# Patient Record
Sex: Female | Born: 1960
Health system: Southern US, Community
[De-identification: ages and names within clinical notes are randomized; demographics above are authoritative.]

## PROBLEM LIST (undated history)

## (undated) DIAGNOSIS — Z923 Personal history of irradiation: Secondary | ICD-10-CM

## (undated) DIAGNOSIS — M109 Gout, unspecified: Secondary | ICD-10-CM

## (undated) DIAGNOSIS — M7989 Other specified soft tissue disorders: Secondary | ICD-10-CM

## (undated) DIAGNOSIS — Z780 Asymptomatic menopausal state: Secondary | ICD-10-CM

## (undated) DIAGNOSIS — Z9221 Personal history of antineoplastic chemotherapy: Secondary | ICD-10-CM

## (undated) DIAGNOSIS — I10 Essential (primary) hypertension: Secondary | ICD-10-CM

## (undated) DIAGNOSIS — A159 Respiratory tuberculosis unspecified: Secondary | ICD-10-CM

## (undated) DIAGNOSIS — K635 Polyp of colon: Secondary | ICD-10-CM

## (undated) DIAGNOSIS — E119 Type 2 diabetes mellitus without complications: Secondary | ICD-10-CM

## (undated) DIAGNOSIS — C50919 Malignant neoplasm of unspecified site of unspecified female breast: Secondary | ICD-10-CM

## (undated) DIAGNOSIS — R011 Cardiac murmur, unspecified: Secondary | ICD-10-CM

## (undated) DIAGNOSIS — E785 Hyperlipidemia, unspecified: Secondary | ICD-10-CM

## (undated) DIAGNOSIS — G4730 Sleep apnea, unspecified: Secondary | ICD-10-CM

## (undated) HISTORY — DX: Polyp of colon: K63.5

## (undated) HISTORY — DX: Type 2 diabetes mellitus without complications: E11.9

## (undated) HISTORY — DX: Hyperlipidemia, unspecified: E78.5

## (undated) HISTORY — PX: POLYPECTOMY: SHX149

## (undated) HISTORY — DX: Other specified soft tissue disorders: M79.89

## (undated) HISTORY — DX: Respiratory tuberculosis unspecified: A15.9

## (undated) HISTORY — DX: Cardiac murmur, unspecified: R01.1

## (undated) HISTORY — DX: Asymptomatic menopausal state: Z78.0

## (undated) HISTORY — DX: Gout, unspecified: M10.9

## (undated) HISTORY — PX: COLONOSCOPY: SHX174

## (undated) HISTORY — DX: Malignant neoplasm of unspecified site of unspecified female breast: C50.919

## (undated) HISTORY — PX: BREAST BIOPSY: SHX20

## (undated) HISTORY — DX: Essential (primary) hypertension: I10

---

## 2005-05-27 ENCOUNTER — Emergency Department: Payer: Self-pay | Admitting: Emergency Medicine

## 2005-10-18 ENCOUNTER — Ambulatory Visit: Payer: Self-pay | Admitting: Gastroenterology

## 2010-05-29 ENCOUNTER — Ambulatory Visit: Payer: Self-pay

## 2011-01-10 ENCOUNTER — Encounter: Payer: Self-pay | Admitting: Obstetrics and Gynecology

## 2011-01-10 ENCOUNTER — Ambulatory Visit (INDEPENDENT_AMBULATORY_CARE_PROVIDER_SITE_OTHER): Payer: PRIVATE HEALTH INSURANCE | Admitting: Obstetrics and Gynecology

## 2011-01-10 VITALS — BP 118/92 | HR 90 | Ht 64.0 in | Wt 242.0 lb

## 2011-01-10 DIAGNOSIS — Z01419 Encounter for gynecological examination (general) (routine) without abnormal findings: Secondary | ICD-10-CM

## 2011-01-10 DIAGNOSIS — Z1272 Encounter for screening for malignant neoplasm of vagina: Secondary | ICD-10-CM

## 2011-01-10 DIAGNOSIS — Z113 Encounter for screening for infections with a predominantly sexual mode of transmission: Secondary | ICD-10-CM

## 2011-01-10 LAB — RPR

## 2011-01-10 NOTE — Progress Notes (Signed)
  Subjective:    Patient ID: Kim Roberts, female    DOB: 08-Dec-1960, 50 y.o.   MRN: [id number]  HPI 50yo Go presenting today for annual exam. Patient has been postmenopausal for the past 3 years. Patient is without any complaints and denies abnormal bleeding, discharge or pelvic pain. Patient is sexually active and in a monogamous relationship. Patient denies any symptoms of dyspareunia.  PMH: Hypertension  PSH: Denies  PGynH: H/o abnormal pap smears years ago followed by repeat pap smear. Denies fibroid or ovarian cyst. Patient had colonoscopy in 2005 and a mammogram in December 2011.  Family Hx: mother diagnosed with colon cancer in her 92's, brother with colon cancer in his 30's and father with lung cancer diagnosed in his 35's; no other significant health problems in the family  Social Hx: Denies alcohol abuse, smoking or the use of illicit drugs   Review of Systems Negative    Objective:   Physical Exam  GENERAL: Well-developed, well-nourished female in no acute distress.  HEENT: Normocephalic, atraumatic. Sclerae anicteric.  NECK: Supple. Normal thyroid.  LUNGS: Clear to auscultation bilaterally.  HEART: Regular rate and rhythm. BREASTS: Symmetric in size. No palpable masses or lymphadenopathy, skin changes, or nipple drainage. ABDOMEN: Soft, nontender, nondistended. No organomegaly. PELVIC: Normal external female genitalia. Vagina is pink and rugated.  Normal discharge. Normal appearing cervix. Uterus is normal in size.  No adnexal mass or tenderness. EXTREMITIES: No cyanosis, clubbing, or edema, 2+ distal pulses.      Assessment & Plan:  50yo G0 here for annual exam - pap smear was collected - all STD testing performed at patient's request - Patient to be seen by PCP tomorrow and will have colonoscopy scheduled - patient will be contacted with any abnormal results. - Patient is interested in Lynch syndrome testing and will return for that or have the test performed  by PCP - Patient to return in a year or prn.

## 2011-01-11 ENCOUNTER — Ambulatory Visit (INDEPENDENT_AMBULATORY_CARE_PROVIDER_SITE_OTHER): Payer: PRIVATE HEALTH INSURANCE | Admitting: Family Medicine

## 2011-01-11 ENCOUNTER — Encounter: Payer: Self-pay | Admitting: Family Medicine

## 2011-01-11 ENCOUNTER — Ambulatory Visit (AMBULATORY_SURGERY_CENTER): Payer: PRIVATE HEALTH INSURANCE | Admitting: *Deleted

## 2011-01-11 VITALS — BP 120/80 | HR 86 | Temp 98.5°F | Ht 65.0 in | Wt 242.2 lb

## 2011-01-11 DIAGNOSIS — R229 Localized swelling, mass and lump, unspecified: Secondary | ICD-10-CM

## 2011-01-11 DIAGNOSIS — Z23 Encounter for immunization: Secondary | ICD-10-CM

## 2011-01-11 DIAGNOSIS — Z1211 Encounter for screening for malignant neoplasm of colon: Secondary | ICD-10-CM

## 2011-01-11 DIAGNOSIS — R2230 Localized swelling, mass and lump, unspecified upper limb: Secondary | ICD-10-CM

## 2011-01-11 DIAGNOSIS — Z86010 Personal history of colonic polyps: Secondary | ICD-10-CM

## 2011-01-11 DIAGNOSIS — Z800 Family history of malignant neoplasm of digestive organs: Secondary | ICD-10-CM

## 2011-01-11 DIAGNOSIS — L83 Acanthosis nigricans: Secondary | ICD-10-CM

## 2011-01-11 LAB — HEPATITIS C ANTIBODY: HCV Ab: NEGATIVE

## 2011-01-11 MED ORDER — PEG-KCL-NACL-NASULF-NA ASC-C 100 G PO SOLR: 1.0000 | Freq: Once | ORAL | Status: DC

## 2011-01-11 NOTE — Progress Notes (Signed)
Pt states she will bring in a copy of her last colonoscopy when she comes in for her procedure

## 2011-01-11 NOTE — Assessment & Plan Note (Addendum)
R>L, h/o boils. Refer for consideration of excision.

## 2011-01-11 NOTE — Patient Instructions (Addendum)
See Shirlee Limerick about your referral before your leave today. I would get a flu shot each fall.   Please send your labs from work to me in October.  Take care.

## 2011-01-12 ENCOUNTER — Encounter: Payer: Self-pay | Admitting: Family Medicine

## 2011-01-12 ENCOUNTER — Encounter: Payer: Self-pay | Admitting: Gastroenterology

## 2011-01-12 ENCOUNTER — Telehealth: Payer: Self-pay | Admitting: Family Medicine

## 2011-01-12 DIAGNOSIS — L83 Acanthosis nigricans: Secondary | ICD-10-CM | POA: Insufficient documentation

## 2011-01-12 NOTE — Assessment & Plan Note (Signed)
Refer

## 2011-01-12 NOTE — Assessment & Plan Note (Signed)
D/w pt that this was a marker for insulin resistance.  Work on diet and weight.  She had labs done at work prev, will have repeat done at workplace screening in 10/12.  She'll get me a copy.

## 2011-01-12 NOTE — Telephone Encounter (Signed)
Pt had HTN and h/o TB listed in chart from the [facility] clinic.  Please call pt.  No mention of this was made at the OV with me. Please clarify the following: -if/when she had dx of HTN -if she is/was on BP meds -if/when she had dx of TB or positive PPD -if she is/was on meds for TB/postive PPD.  Please ask if there are other health history items that weren't stated at the OV with me.

## 2011-01-12 NOTE — Progress Notes (Signed)
New pt to est care.   FH of colon CA.  Needs eval.  No blood in stool.    H/o axillary mass.  H/o boils in axilla.    Acanthosis nigricans.  See discussion below.  PMH and SH reviewed  ROS: See HPI, otherwise noncontributory.  Meds, vitals, and allergies reviewed.   GEN: nad, alert and oriented, overweight HEENT: mucous membranes moist NECK: supple w/o LA CV: rrr. PULM: ctab, no inc wob ABD: soft, +bs EXT: no edema SKIN: no acute rash but acanthotic changes noted on the neck.  Prominent but not ttp fat pad noted in R axilla.

## 2011-01-12 NOTE — Telephone Encounter (Signed)
Left message on cell phone voicemail for patient to return call.  Called her at work and she is off today.

## 2011-01-15 NOTE — Telephone Encounter (Signed)
Left message for patient to return my call.

## 2011-01-15 NOTE — Telephone Encounter (Signed)
Left message on cell phone voicemail for patient to return call.  Called work number and was advised that she only works Friday, Sat, and Sundays.

## 2011-01-22 ENCOUNTER — Ambulatory Visit (AMBULATORY_SURGERY_CENTER): Payer: PRIVATE HEALTH INSURANCE | Admitting: Gastroenterology

## 2011-01-22 ENCOUNTER — Encounter: Payer: Self-pay | Admitting: Gastroenterology

## 2011-01-22 DIAGNOSIS — Z800 Family history of malignant neoplasm of digestive organs: Secondary | ICD-10-CM

## 2011-01-22 DIAGNOSIS — D126 Benign neoplasm of colon, unspecified: Secondary | ICD-10-CM

## 2011-01-22 DIAGNOSIS — Z8371 Family history of colonic polyps: Secondary | ICD-10-CM

## 2011-01-22 DIAGNOSIS — Z86010 Personal history of colonic polyps: Secondary | ICD-10-CM

## 2011-01-22 DIAGNOSIS — Z1211 Encounter for screening for malignant neoplasm of colon: Secondary | ICD-10-CM

## 2011-01-22 MED ORDER — SODIUM CHLORIDE 0.9 % IV SOLN: 500.0000 mL | INTRAVENOUS | Status: DC

## 2011-01-22 NOTE — Patient Instructions (Signed)
FOLLOW DISCHARGE INSTRUCTIONS BLUE & GREEN SHEETS   INFORMATION ON POLYPS GIVEN TO PT.   ALL INFO REGARDING DIAGNOSIS IN SEALED ENVELOPE & SENT HOME WITH PT . SINCE PT . HIPPA

## 2011-01-23 ENCOUNTER — Telehealth: Payer: Self-pay

## 2011-01-23 NOTE — Telephone Encounter (Signed)
No answer

## 2011-01-24 ENCOUNTER — Ambulatory Visit (INDEPENDENT_AMBULATORY_CARE_PROVIDER_SITE_OTHER): Payer: Self-pay | Admitting: General Surgery

## 2011-01-30 NOTE — Telephone Encounter (Signed)
Please try to contact pt as below.

## 2011-01-31 ENCOUNTER — Encounter: Payer: Self-pay | Admitting: Family Medicine

## 2011-01-31 NOTE — Telephone Encounter (Signed)
Agreed, noted.  History updated.

## 2011-01-31 NOTE — Telephone Encounter (Signed)
Spoke with patient.   She has never really been diagnosed with HTN but she has had some high BP readings intermittently.  She has never been on hypertensive medications. 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 Patient says she does have some concerns about her BP.  I asked her to try to get some readings and keep a log of them to report to [place].  She was given our fax number or advised that she could call them in as well.  She agreed and says she can probably do that at her job but she only works Fridays, Sat., Sun.

## 2011-02-13 ENCOUNTER — Ambulatory Visit (INDEPENDENT_AMBULATORY_CARE_PROVIDER_SITE_OTHER): Payer: Self-pay | Admitting: General Surgery

## 2011-02-22 ENCOUNTER — Ambulatory Visit (INDEPENDENT_AMBULATORY_CARE_PROVIDER_SITE_OTHER): Payer: Self-pay | Admitting: General Surgery

## 2011-03-27 ENCOUNTER — Ambulatory Visit (INDEPENDENT_AMBULATORY_CARE_PROVIDER_SITE_OTHER): Payer: PRIVATE HEALTH INSURANCE | Admitting: General Surgery

## 2011-03-29 ENCOUNTER — Ambulatory Visit (INDEPENDENT_AMBULATORY_CARE_PROVIDER_SITE_OTHER): Payer: Self-pay | Admitting: General Surgery

## 2011-04-03 ENCOUNTER — Encounter (INDEPENDENT_AMBULATORY_CARE_PROVIDER_SITE_OTHER): Payer: Self-pay | Admitting: General Surgery

## 2011-04-03 ENCOUNTER — Ambulatory Visit (INDEPENDENT_AMBULATORY_CARE_PROVIDER_SITE_OTHER): Payer: PRIVATE HEALTH INSURANCE | Admitting: General Surgery

## 2011-04-03 VITALS — BP 124/88 | HR 60 | Temp 97.9°F | Resp 20 | Ht 65.0 in | Wt 236.4 lb

## 2011-04-03 DIAGNOSIS — D1720 Benign lipomatous neoplasm of skin and subcutaneous tissue of unspecified limb: Secondary | ICD-10-CM

## 2011-04-03 DIAGNOSIS — D1739 Benign lipomatous neoplasm of skin and subcutaneous tissue of other sites: Secondary | ICD-10-CM

## 2011-04-03 NOTE — Progress Notes (Signed)
Subjective:     Patient ID: Kim Roberts, female   DOB: Nov 04, 1960, 50 y.o.   MRN: [id number]  HPIPatient is a 50 year old female who is significantly overweight who has an increase in size lipoma in the right axilla a smaller fatty tissue in the left axilla who desires that it be removed she was referred by Dr. Dr. Chong Sicilian who referred her for colonoscopy by GI and to [place] for consideration of excision of this large axillary mass patient says this been gradually increasing in size for approximately 3 years she says she's not able to wear sleeveless wises because of visible and that she has had some problems with a low-grade infection the actual treated with antibiotics years ago and describes O. family members it sounds like hidradenitis. 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   Review of Systems Past Surgical History  Procedure Date  . Colonoscopy   . Polypectomy    No current outpatient prescriptions on file.   No Known Allergies On review of system the patient denies any problems with high blood pressure cardiac showed rather chronic problem    Objective:   Physical ExamBP 124/88  Pulse 60  Temp 97.9 F (36.6 C)  Resp 20  Ht 5\' 5"  (1.651 m)  Wt 236 lb 6 oz (107.219 kg)  BMI 39.33 kg/m2 Examination Limited predominantly to the breast axilla shows a fairly overweight black female in no acute distress there is a small axillary fat pad and the left axilla and a larger one in the right which she is concerned about on palpation of this area there is no evidence of any firm mass I appreciate no axillary lymphadenopathy. I did axillary exam here in the office and this appears to be just  excess fatty tissue nothing I can appreciate that looks like a breast to assure and she says it's been increasing in size for approximately 3 year. There is no active areas of hidradenitis and I can appreciate at this time in either the right or left axilla. Two things will need to be performed before considering surgery and picturesfor her insurance coverage were taken and I think we need to get approval by her insurance company for proceed in to schedule an. I also think she needs a recent mammogram that I do not think that they'll find any mammillary to share in this mass and I think this is just be excess fatty and not truly a localized lipoma the left axilla is also slightly larger than normal but much smaller than the right. He denies other lipomas and other areas of her body but she does weigh 236 parents and is only 5 feet 5     Assessment:    Excess fatty tissue in the right axilla that the patient desires to be excised. I discussed with her that this is a fairly significant operation and it needs to be done in the operating room will be a prominent 3-4 inch incision had minimal and that she would be out of work for probably about 2 weeks. I think it very unlikely to be any type of serious problem and some would consider this cosmetic  Plan:    See me in approximately 2 weeks and bring your mammograms from [facility] on her next appointment

## 2011-04-03 NOTE — Patient Instructions (Signed)
Patient will need a repeat mammogram with attention to both axilla and then we'll also need to work with her insurance company to see whether excision of a lipoma in the axilla would be covered

## 2011-04-04 ENCOUNTER — Other Ambulatory Visit (INDEPENDENT_AMBULATORY_CARE_PROVIDER_SITE_OTHER): Payer: Self-pay | Admitting: General Surgery

## 2011-04-05 ENCOUNTER — Telehealth (INDEPENDENT_AMBULATORY_CARE_PROVIDER_SITE_OTHER): Payer: Self-pay

## 2011-04-05 NOTE — Telephone Encounter (Signed)
Repeat mammogram scheduled for 05/31/2011 at [facility] was made on 04/04/2011.  Order was faxed. Message left on patients voicemail [phone] for return call about possible cost of axillary surgery on the same day. Message also left today 04/05/2011. RMP

## 2011-05-31 ENCOUNTER — Ambulatory Visit: Payer: Self-pay

## 2011-06-26 ENCOUNTER — Ambulatory Visit (INDEPENDENT_AMBULATORY_CARE_PROVIDER_SITE_OTHER): Payer: PRIVATE HEALTH INSURANCE | Admitting: Obstetrics and Gynecology

## 2011-06-26 ENCOUNTER — Encounter: Payer: Self-pay | Admitting: Obstetrics and Gynecology

## 2011-06-26 VITALS — BP 135/84 | HR 87 | Ht 66.0 in | Wt 242.0 lb

## 2011-06-26 DIAGNOSIS — Z780 Asymptomatic menopausal state: Secondary | ICD-10-CM

## 2011-06-26 NOTE — Patient Instructions (Signed)
Contraceptive Devices (IUD) IUD stands for intrauterine device. Intrauterine means inside the womb (uterus). The purpose of the IUD is to prevent pregnancy. IUDs make it more difficult for your partner's sperm to get into your womb and into your fallopian tubes, where the eggs are fertilized. IUDs also alter the secretions of your cervix, which make it a stronger sperm barrier. They also affect the lining of the womb, so it is harder for an egg to implant. The IUD does not cause an abortion. There are 2 types of IUDs available:  Copper IUD gives off a small amount of copper inside the uterus. This prevents the sperm from going through the uterus, up into the fallopian tube, where the egg is fertilized. The copper IUD can also damage or prevent the fertilized egg from attaching on the inside lining of the uterus. It can stay in place for 10 years. The copper IUD can be used as an emergency contraceptive, if inserted within 5 days after having unprotected sexual intercourse.   Hormone IUD contains progestin (synthetic progesterone), and it releases this hormone into the uterus. The hormone thickens the mucus on the cervix and prevents sperm from entering the uterus. It also weakens the sperm that get into the uterus, so that the sperm and egg cannot live in the fallopian tube. It also makes the inside lining of the uterus thinner, which makes it difficult for a fertilized egg to attach to the uterus. The hormone progestin in the IUD decreases the amount of bleeding during a menstrual period and can be helpful in women who have heavy menstrual periods. The hormone IUD can stay in place for 5 years.  SIDE EFFECTS OF THE IUD:  There may be more cramping or pain with periods.   It may cause heavier, longer periods, which can cause lack of red blood cells (anemia) and can interfere with your daily and sexual activities.  This method of birth control is not usually the best choice for a woman with heavy or  prolonged periods. The birth control pill may be a better choice. IUDs work best for women who have already had a pregnancy, because the cervix is more open, making the insertion of the device easier and less painful. However, many women without children use the IUD. One of the main goals of patient selection is to prevent unintentionally inserting an IUD into a patient who has an STD (sexually transmitted disease), who is at high risk of exposure to an STD, or who is already pregnant. That is why the IUD is inserted during, or right after, a menstrual period. REASONS NOT TO USE AN IUD:  The womb or cervix is not shaped normally.   You have or have had a pelvic infection, such as an STD, in the past 3 months.   You have or suspect cancer in the female organs.   You have an abnormal Pap smear.   You have certain liver diseases.   There is severe infection or inflammation of the cervix (cervicitis).   You have unexplained vaginal bleeding.   You have heart valve problems (unless a heart specialist advises otherwise).   You are allergic to copper (rare).   You previously had a pregnancy outside the uterus (ectopic).   You are pregnant or suspect you are pregnant.   You have prolonged or heavy periods, or heavy pain or cramping with periods (except for the hormone IUD).   You have or suspect pelvic cancer.   You have an   STD.   The cervix or uterus has problems (cervical stenosis, fibroids in the uterus) making it difficult to insert the IUD.  IUDs should be removed when a woman becomes menopausal or pregnant. BENEFITS OF THE IUD:  You are always ready to have sexual intercourse.   The copper IUD does not interfere with your female hormones.   The copper IUD can be used as emergency contraception.   An IUD can be used while nursing.   It works immediately after insertion, and there is no problem getting pregnant when it is removed.   It does not interfere with foreplay.    The progesterone IUD can make heavy menstrual periods lighter.   The progesterone IUD can be used for 5 years.   The copper IUD can be used for 10 years.  RISKS AND COMPLICATIONS  Putting the IUD through the uterus, into the pelvis or abdomen (perforation of the uterus).   Losing the IUD (expulsion). This is more common in women who never had children.   When pregnant with an IUD, there is an increased chance of an infection and loss of the pregnancy.   Pregnancy in the fallopian tube (ectopic).   STD, in women who have more than 1 sex partner. The IUD does not protect against STDs.   Other minor side effects may include:   Headaches.   Feeling sick to your stomach (nausea).   Breast tenderness.   Depression.  PROCEDURE   The IUD is inserted during or right after a menstrual period, to make sure you are not pregnant.   You will lie on an exam table, naked from the waist down.   Your caregiver will do an exam to determine the size and position of your uterus.   Usually, an anesthetic is not needed.   Your caregiver may give you a pain pill to take, 1 or 2 hours before the procedure.   Sometimes, a paracervical block may be used to block and control any discomfort with insertion.   A tool (speculum) is then placed in your vagina (birth canal) so your caregiver can see the cervix.   A sound is sent into the uterus to check the depth of the uterus.   A slim instrument (IUD inserter), which is shaped like a drinking straw, is inserted through the small opening in your cervix and into your uterus.   Then, the IUD is pushed in with a plunger, much like a syringe, and the inserter is removed. There may be some cramping and pain during the insertion.   Relaxing helps to lessen the discomfort.   Following the procedure, you will usually spot blood. Have some pads with you. Avoid using tampons for 2 weeks. Bleeding after the procedure is normal. It varies from light  spotting for a few days to menstrual-like bleeding for up to 3 weeks. It may be like a period if it is near the time for your normal period.   You may also have mild cramping. Only take over-the-counter or prescription medicines for pain, discomfort, or fever as directed by your caregiver. Do not use aspirin, as this may increase bleeding.   Practice checking the string coming out of the cervix, to make sure the IUD is always in the uterus.  HOME CARE INSTRUCTIONS   Do not drive for 24 hours.   Only take over-the-counter or prescription medicines for pain, discomfort, or fever as directed by your caregiver.   No tampons, douching, or sexual intercourse for   2 weeks, or until your caregiver approves.   Rest at home for 24 hours. You may then resume normal activities, unless told otherwise by your caregiver.   Check your IUD prior to resuming sexual activity, to make sure it is in place. Make sure that you can feel the strings. An IUD can be pushed out and lost without the user even knowing it is gone. Also, if the strings are getting longer, it may mean that the IUD is being forced out of the uterus. This means it is no longer offering full protection from pregnancy.   Take any medications your caregiver has ordered, as directed.   Make sure to keep your recheck appointment, so your caregiver can make sure your IUD has remained in place. After that exam, yearly exams are advised, unless you cannot feel the strings of your IUD.   Check that the IUD is still in place by feeling for the strings after every menstrual period.  SEEK MEDICAL CARE IF:   Bleeding is heavier than a normal menstrual cycle.   You have an oral temperature above 102 F (38.9 C).   You have increasing cramps or pains, not relieved with medicine. Or you develop belly (abdominal) pain that does not seem to be related to the same area of earlier cramping and pain.   You are lightheaded, unusually weak, or have fainting  episodes.   You develop pain in the tops of your shoulders (shoulder strap areas).   You are having problems or questions, which have not been answered well enough by your caregiver.   You develop abdominal pain.   You have pain during sexual intercourse.   You cannot feel the IUD strings.   You have abnormal vaginal discharge.   You feel the IUD at the opening of the cervix in the vagina.   You think you are pregnant.   You miss your menstrual period.   The IUD string is hurting your sex partner.   The IUD string has gotten longer.  Document Released: 05/01/2004 Document Revised: 09/12/2010 Document Reviewed: 06/13/2009 [facility] Patient Information 2012 [place], [place].  Sterilization, Women Sterilization is a surgical procedure. This surgery permanently prevents pregnancy in women. This can be done by tying (with or without cutting) the fallopian tubes or burning the tubes closed (tubal ligation). Tubal ligation blocks the tubes and prevents the egg from being fertilized by the sperm. Sterilization can be done by removing the ovaries that produce the egg (castration) as well. Sterilization is considered safe with very rare complications. It does not affect menstrual periods, sexual desire, or performance.  Since sterilization is considered permanent, you should not do it until you are sure you do not want to have more children. You and your partner should fully agree to have the procedure. Your decision to have the procedure should not be made when you are in a stressful situation. This can include a loss of a pregnancy, illness or death of a spouse, or divorce. There are other means of preventing unwanted pregnancies that can be used until you are completely sure you want to be sterilized. Sterilization does not protect against sexually transmitted disease. Women who had a sterilization procedure and want it reversed must know that it requires an expensive and major operation. The  reversal may not be successful and has a high rate of tubal (ectopic) pregnancy that can be dangerous and require surgery. There are several ways to perform a tubal sterlization:  Laparoscopy. The abdomen is  filled with a gas to see the pelvic organs. Then, a tube with a light attached is inserted into the abdomen through 2 small incisions. The fallopian tubes are blocked with a ring, clip or electrocautery to burn closed the tubes. Then, the gas is released and the small incisions are closed.   Hysteroscopy. A tube with a light is inserted in the vagina, through the cervix and then into the uterus. A spring-like instrument is inserted into the opening of the fallopian tubes. The spring causes scaring and blocks the tubes. Other forms of contraception should be used for three months at which time an X-ray is done to be sure the tubes are blocked.   Minilaparotomy. This is done right after giving birth. A small incision is made under the belly button and the tubes are exposed. The tubes can then be burned, tied and/or cut.   Tubal ligation can be done during a Cesarean section.   Castration is a surgical procedure that removes both ovaries.  Tubal sterilization should be discussed with your caregiver to answer any concerns you or your partner might have. This meeting will help to decide for sure if the operation is safe for you and which procedure is the best one for you. You can change your mind and cancel the surgery at any time. HOME CARE INSTRUCTIONS   Follow your caregivers instructions regarding diet, rest, work, social and sexual activities and follow up appointments.   Shoulder pain is common following a laparoscopy. The pain may be relieved by lying down flat.   Only take over-the-counter or prescription medicines for pain, discomfort or fever as directed by your caregiver.   You may use lozenges for throat discomfort.   Keep the incisions covered to prevent infection.  SEEK IMMEDIATE  MEDICAL CARE IF:   You develop a temperature of 102 F (38.9 C), or as your caregiver suggests.   You become dizzy or faint.   You start to feel sick to your stomach (nausea) or throw up (vomit).   You develop abdominal pain not relieved with over-the-counter medications.   You have redness and puffiness (swelling) of the cut (incision).   You see pus draining from the incision.   You miss a menstrual period.  Document Released: 11/14/2007 Document Revised: 02/07/2011 Document Reviewed: 11/14/2007 [facility] Patient Information 2012 [place], [place].

## 2011-06-26 NOTE — Progress Notes (Signed)
51 yo AA with BMI 39.2 presenting today requesting information on birth control options. At her annual visit in August, patient reported being amenorrheic for the past 2-3 years. Her sexual partner is concern of the possibility of pregnancy and desires the patient to be initiated on OCP.  It was explained to the patient that due to her prolonged amenorrhea and her age, she is more than likely menopausal. An FSH and LH level can will be ordered to confirm that. Patient was still provided with information on Mirena IUD and BTL at her request. Patient will be contacted with any abnormal results and will return to discuss birth control if indicated.

## 2011-07-03 ENCOUNTER — Encounter (INDEPENDENT_AMBULATORY_CARE_PROVIDER_SITE_OTHER): Payer: Self-pay | Admitting: General Surgery

## 2011-07-03 ENCOUNTER — Ambulatory Visit (INDEPENDENT_AMBULATORY_CARE_PROVIDER_SITE_OTHER): Payer: PRIVATE HEALTH INSURANCE | Admitting: General Surgery

## 2011-07-03 VITALS — BP 152/100 | HR 96 | Temp 97.8°F | Resp 20 | Ht 66.0 in | Wt 240.6 lb

## 2011-07-03 DIAGNOSIS — R229 Localized swelling, mass and lump, unspecified: Secondary | ICD-10-CM

## 2011-07-03 DIAGNOSIS — R2230 Localized swelling, mass and lump, unspecified upper limb: Secondary | ICD-10-CM

## 2011-07-03 NOTE — Assessment & Plan Note (Signed)
Plan excision of R axillary mass.   Feels like lipoma 3x4x4 cm Discussed incision, recovery time, risk of infection, risk of wound breakdown, risk of bleeding and swelling. She understands and wishes to proceed.

## 2011-07-03 NOTE — Progress Notes (Signed)
Chief Complaint   Patient presents with   .  Other     est pt- pt of dr ww, eval bil. axillary mass    HISTORY:  Pt presents with many year history of R axillary mass. This has been painful and catches in her clothes. She also has issues wearing sleeveless shirts due to the size of the mass. She has had prior infection in the skin overlying the mass. Her mammograms have been negative. She denies fever/chills. She has not had changes with her menstrual cycle.   Past Medical History   Diagnosis  Date   .  Heart murmur      as a child   .  Menopause    .  Hypertension    .  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   .  Night sweats    .  Hyperlipidemia    .  Colon polyp     Past Surgical History   Procedure  Date   .  Colonoscopy    .  Polypectomy     No current outpatient prescriptions on file.   No Known Allergies  Family History   Problem  Relation  Age of Onset   .  Cancer  Mother  62      colon cancer    .  Colon cancer  Mother    .  Cancer  Father  63      lung cancer    .  Seizures  Brother    .  Colon cancer  Brother    .  Diabetes  Paternal Uncle    .  Asthma  Other    .  Esophageal cancer  Neg Hx    .  Stomach cancer  Neg Hx     History    Social History   .  Marital Status:  Single     Spouse Name:  N/A     Number of Children:  N/A   .  Years of Education:  N/A    Social History Main Topics   .  Smoking status:  Never Smoker   .  Smokeless tobacco:  Never Used   .  Alcohol Use:  No      occassionally   .  Drug Use:  No   .  Sexually Active:  Yes     Birth Control/ Protection:  Post-menopausal    Other Topics  Concern   .  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  EXAM:  Filed Vitals:    07/03/11 0858   BP:  152/100   Pulse:  96   Temp:  97.8 F (36.6 C)   Resp:  20   Gen: No acute distress. Well  nourished and well groomed. Obese.  Neurological: Alert and oriented to person, place, and time. Coordination normal.  Head: Normocephalic and atraumatic.  Eyes: Conjunctivae are normal. Pupils are equal, round, and reactive to light. No scleral icterus.  Neck: Normal range of motion. Neck supple. No tracheal deviation or thyromegaly present.  Cardiovascular: Normal rate, regular rhythm, normal heart sounds and intact distal pulses. Exam reveals no gallop and no friction rub. No murmur heard.  Respiratory: Effort normal. No respiratory distress. No chest wall tenderness. Breath sounds normal. No wheezes, rales or rhonchi.  GI:   Soft. Bowel sounds are normal. The abdomen is soft and nontender. There is no rebound and no guarding.  Musculoskeletal: Normal range of motion. Extremities are nontender.  Lymphadenopathy: No cervical, preauricular, postauricular or axillary adenopathy is present Skin: Skin is warm and dry. No rash noted. No diaphoresis. No erythema. No pallor. No clubbing, cyanosis, or edema.  Right axilla with fatty mass that is mobile. Feels like it is 3x4x4 cm in size. No overlying infection. Psychiatric: Normal mood and affect. Behavior is normal. Judgment and thought content normal.   ASSESSMENT AND PLAN:  Axillary mass  Plan excision of R axillary mass.  Feels like lipoma 3x4x4 cm  Discussed incision, recovery time, risk of infection, risk of wound breakdown, risk of bleeding and swelling.  She understands and wishes to proceed.   Haydan Mansouri L Loran Fleet MD  Surgical Oncology, General and Endocrine Surgery  [facility]   Visit Diagnoses:  1.  Axillary mass   Primary Care Physician:  Graham Duncan, MD, MD     

## 2011-07-03 NOTE — Patient Instructions (Signed)
Plan to be out of work 1-2 weeks.  Get spray deodorant for use preop  Do not shave axilla within 24-36 hours before surgery.

## 2011-07-12 ENCOUNTER — Encounter (INDEPENDENT_AMBULATORY_CARE_PROVIDER_SITE_OTHER): Payer: Self-pay

## 2011-08-16 ENCOUNTER — Encounter (HOSPITAL_COMMUNITY): Payer: Self-pay | Admitting: Pharmacy Technician

## 2011-08-21 ENCOUNTER — Encounter (HOSPITAL_COMMUNITY): Payer: Self-pay

## 2011-08-21 ENCOUNTER — Encounter (HOSPITAL_COMMUNITY)
Admission: RE | Admit: 2011-08-21 | Discharge: 2011-08-21 | Disposition: A | Discharge status: Home / Self Care | Payer: PRIVATE HEALTH INSURANCE | Source: Ambulatory Visit | Attending: General Surgery | Admitting: General Surgery

## 2011-08-21 ENCOUNTER — Telehealth: Payer: Self-pay | Admitting: Family Medicine

## 2011-08-21 LAB — CBC
MCH: 29.9 pg (ref 26.0–34.0)
MCV: 89.4 fL (ref 78.0–100.0)
Platelets: 313 10*3/uL (ref 150–400)
RBC: 4.32 MIL/uL (ref 3.87–5.11)
RDW: 14.9 % (ref 11.5–15.5)
WBC: 7.1 10*3/uL (ref 4.0–10.5)

## 2011-08-21 LAB — SURGICAL PCR SCREEN: MRSA, PCR: NEGATIVE

## 2011-08-21 NOTE — Telephone Encounter (Signed)
Does not look like we have been given the authority to leave a detailed message so I asked the patient to return the call on her VM.

## 2011-08-21 NOTE — Progress Notes (Signed)
08/21/11 1500  OBSTRUCTIVE SLEEP APNEA  Have you ever been diagnosed with sleep apnea through a sleep study? No  Do you snore loudly (loud enough to be heard through closed doors)?  1  Do you often feel tired, fatigued, or sleepy during the daytime? 1  Has anyone observed you stop breathing during your sleep? 1  Do you have, or are you being treated for high blood pressure? 0  BMI more than 35 kg/m2? 1  Age over 49 years old? 0  Neck circumference greater than 40 cm/18 inches? 0  Gender: 0  Obstructive Sleep Apnea Score 4   Score 4 or greater  Updated health history

## 2011-08-21 NOTE — Telephone Encounter (Signed)
Pt was screened for OSA.  We can discuss at her next OV.  If she continues to snore loudly or have AM fatigue, when we should address this.  Please notify pt. Thanks.

## 2011-08-21 NOTE — Patient Instructions (Signed)
20 Kim Roberts  08/21/2011   Your procedure is scheduled on:  Thursday 03/21/02013 at 1245pm  Report to [facility] at 1045 AM.  Call this number if you have problems the morning of surgery: [phone]   Remember:   Do not eat food:After Midnight.  May have clear liquids:until Midnight .  Clear liquids include soda, tea, black coffee, apple or grape juice, broth.  Take these medicines the morning of surgery with A SIP OF WATER: none   Do not wear jewelry, make-up or nail polish.  Do not wear lotions, powders, or perfumes.   Do not shave 48 hours prior to surgery.(shaving legs-women only)  Do not bring valuables to the hospital.  Contacts, dentures or bridgework may not be worn into surgery.  Leave suitcase in the car. After surgery it may be brought to your room.  For patients admitted to the hospital, checkout time is 11:00 AM the day of discharge.   Patients discharged the day of surgery will not be allowed to drive home.  Name and phone number of your driver: Kim Roberts [phone]  Special Instructions: CHG Shower Use Special Wash: 1/2 bottle night before surgery and 1/2 bottle morning of surgery.   Please read over the following fact sheets that you were given: MRSA Information, sleep apnea sheet, incentive spirometry

## 2011-08-21 NOTE — Pre-Procedure Instructions (Signed)
Called Dr. Vevelyn Royals office and talked to Jacklynn Bue, CMA to inform him to look at abnormal BMET results in [facility].

## 2011-08-24 NOTE — Telephone Encounter (Signed)
Patient advised.

## 2011-08-30 ENCOUNTER — Ambulatory Visit (HOSPITAL_COMMUNITY): Payer: PRIVATE HEALTH INSURANCE | Admitting: Anesthesiology

## 2011-08-30 ENCOUNTER — Encounter (HOSPITAL_COMMUNITY): Payer: Self-pay | Admitting: Anesthesiology

## 2011-08-30 ENCOUNTER — Ambulatory Visit (HOSPITAL_COMMUNITY)
Admission: RE | Admit: 2011-08-30 | Discharge: 2011-08-30 | Disposition: A | Discharge status: Home / Self Care | Payer: PRIVATE HEALTH INSURANCE | Source: Ambulatory Visit | Attending: General Surgery | Admitting: General Surgery

## 2011-08-30 ENCOUNTER — Encounter (HOSPITAL_COMMUNITY): Payer: Self-pay | Admitting: *Deleted

## 2011-08-30 ENCOUNTER — Encounter (HOSPITAL_COMMUNITY)
Admission: RE | Disposition: A | Discharge status: Home / Self Care | Payer: Self-pay | Source: Ambulatory Visit | Attending: General Surgery

## 2011-08-30 DIAGNOSIS — Z01812 Encounter for preprocedural laboratory examination: Secondary | ICD-10-CM | POA: Insufficient documentation

## 2011-08-30 DIAGNOSIS — R599 Enlarged lymph nodes, unspecified: Secondary | ICD-10-CM

## 2011-08-30 DIAGNOSIS — L749 Eccrine sweat disorder, unspecified: Secondary | ICD-10-CM | POA: Insufficient documentation

## 2011-08-30 DIAGNOSIS — R229 Localized swelling, mass and lump, unspecified: Secondary | ICD-10-CM | POA: Insufficient documentation

## 2011-08-30 HISTORY — PX: MASS EXCISION: SHX2000

## 2011-08-30 SURGERY — EXCISION MASS
Anesthesia: General | Site: Axilla | Laterality: Right | Wound class: Clean

## 2011-08-30 MED ORDER — KETOROLAC TROMETHAMINE 30 MG/ML IJ SOLN: 15.0000 mg | Freq: Once | INTRAMUSCULAR | Status: DC | PRN

## 2011-08-30 MED ORDER — OXYCODONE-ACETAMINOPHEN 5-325 MG PO TABS: 1.0000 | ORAL_TABLET | ORAL | Status: AC | PRN

## 2011-08-30 MED ORDER — ONDANSETRON HCL 4 MG/2ML IJ SOLN
INTRAMUSCULAR | Status: DC | PRN
  Administered 2011-08-30: 4 mg via INTRAVENOUS

## 2011-08-30 MED ORDER — EPHEDRINE SULFATE 50 MG/ML IJ SOLN
INTRAMUSCULAR | Status: DC | PRN
  Administered 2011-08-30 (×2): 10 mg via INTRAVENOUS

## 2011-08-30 MED ORDER — PROMETHAZINE HCL 25 MG/ML IJ SOLN: 6.2500 mg | INTRAMUSCULAR | Status: DC | PRN

## 2011-08-30 MED ORDER — FENTANYL CITRATE 0.05 MG/ML IJ SOLN: 25.0000 ug | INTRAMUSCULAR | Status: DC | PRN

## 2011-08-30 MED ORDER — LACTATED RINGERS IV SOLN
INTRAVENOUS | Status: DC | PRN
  Administered 2011-08-30: 14:00:00 via INTRAVENOUS

## 2011-08-30 MED ORDER — BUPIVACAINE-EPINEPHRINE PF 0.25-1:200000 % IJ SOLN
INTRAMUSCULAR | Status: AC
  Filled 2011-08-30: qty 30

## 2011-08-30 MED ORDER — MIDAZOLAM HCL 5 MG/5ML IJ SOLN
INTRAMUSCULAR | Status: DC | PRN
  Administered 2011-08-30: 2 mg via INTRAVENOUS

## 2011-08-30 MED ORDER — SODIUM CHLORIDE 0.9 % IV SOLN: 250.0000 mL | INTRAVENOUS | Status: DC | PRN

## 2011-08-30 MED ORDER — FENTANYL CITRATE 0.05 MG/ML IJ SOLN
INTRAMUSCULAR | Status: DC | PRN
  Administered 2011-08-30: 50 ug via INTRAVENOUS
  Administered 2011-08-30: 100 ug via INTRAVENOUS
  Administered 2011-08-30 (×2): 50 ug via INTRAVENOUS

## 2011-08-30 MED ORDER — CEFAZOLIN SODIUM-DEXTROSE 2-3 GM-% IV SOLR
2.0000 g | INTRAVENOUS | Status: AC
  Administered 2011-08-30: 2 g via INTRAVENOUS

## 2011-08-30 MED ORDER — ACETAMINOPHEN 10 MG/ML IV SOLN
INTRAVENOUS | Status: DC | PRN
  Administered 2011-08-30: 1000 mg via INTRAVENOUS

## 2011-08-30 MED ORDER — LIDOCAINE HCL (PF) 1 % IJ SOLN
INTRAMUSCULAR | Status: DC | PRN
  Administered 2011-08-30: 10 mL

## 2011-08-30 MED ORDER — PROPOFOL 10 MG/ML IV BOLUS
INTRAVENOUS | Status: DC | PRN
  Administered 2011-08-30: 200 mg via INTRAVENOUS

## 2011-08-30 MED ORDER — CEFAZOLIN SODIUM 1-5 GM-% IV SOLN
INTRAVENOUS | Status: AC
  Filled 2011-08-30: qty 100

## 2011-08-30 MED ORDER — BUPIVACAINE-EPINEPHRINE 0.25% -1:200000 IJ SOLN
INTRAMUSCULAR | Status: DC | PRN
  Administered 2011-08-30: 20 mL

## 2011-08-30 MED ORDER — LIDOCAINE HCL 1 % IJ SOLN
INTRAMUSCULAR | Status: AC
  Filled 2011-08-30: qty 40

## 2011-08-30 MED ORDER — DEXAMETHASONE SODIUM PHOSPHATE 10 MG/ML IJ SOLN
INTRAMUSCULAR | Status: DC | PRN
  Administered 2011-08-30: 10 mg via INTRAVENOUS

## 2011-08-30 MED ORDER — ACETAMINOPHEN 10 MG/ML IV SOLN
INTRAVENOUS | Status: AC
  Filled 2011-08-30: qty 100

## 2011-08-30 SURGICAL SUPPLY — 50 items
BANDAGE GAUZE ELAST BULKY 4 IN (GAUZE/BANDAGES/DRESSINGS) ×2 IMPLANT
BENZOIN TINCTURE PRP APPL 2/3 (GAUZE/BANDAGES/DRESSINGS) ×2 IMPLANT
BLADE SURG 10 STRL SS (BLADE) ×2 IMPLANT
BLADE SURG 15 STRL LF DISP TIS (BLADE) ×1 IMPLANT
BLADE SURG 15 STRL SS (BLADE) ×1
CANISTER SUCTION 2500CC (MISCELLANEOUS) ×2 IMPLANT
CHLORAPREP W/TINT 26ML (MISCELLANEOUS) ×2 IMPLANT
CLOTH BEACON ORANGE TIMEOUT ST (SAFETY) ×2 IMPLANT
CLSR STERI-STRIP ANTIMIC 1/2X4 (GAUZE/BANDAGES/DRESSINGS) ×2 IMPLANT
COVER MAYO STAND STRL (DRAPES) IMPLANT
COVER SURGICAL LIGHT HANDLE (MISCELLANEOUS) IMPLANT
DERMABOND ADVANCED (GAUZE/BANDAGES/DRESSINGS)
DERMABOND ADVANCED .7 DNX12 (GAUZE/BANDAGES/DRESSINGS) IMPLANT
DRAPE LAPAROSCOPIC ABDOMINAL (DRAPES) IMPLANT
DRAPE PED LAPAROTOMY (DRAPES) ×2 IMPLANT
DRSG PAD ABDOMINAL 8X10 ST (GAUZE/BANDAGES/DRESSINGS) ×4 IMPLANT
DRSG TEGADERM 4X4.75 (GAUZE/BANDAGES/DRESSINGS) IMPLANT
ELECT CAUTERY BLADE 6.4 (BLADE) ×2 IMPLANT
ELECT REM PT RETURN 9FT ADLT (ELECTROSURGICAL) ×2
ELECTRODE REM PT RTRN 9FT ADLT (ELECTROSURGICAL) ×1 IMPLANT
GAUZE SPONGE 4X4 16PLY XRAY LF (GAUZE/BANDAGES/DRESSINGS) IMPLANT
GLOVE BIO SURGEON STRL SZ 6 (GLOVE) ×2 IMPLANT
GLOVE BIOGEL PI IND STRL 6.5 (GLOVE) ×1 IMPLANT
GLOVE BIOGEL PI INDICATOR 6.5 (GLOVE) ×1
GOWN PREVENTION PLUS XXLARGE (GOWN DISPOSABLE) ×2 IMPLANT
GOWN STRL NON-REIN LRG LVL3 (GOWN DISPOSABLE) ×2 IMPLANT
KIT BASIN OR (CUSTOM PROCEDURE TRAY) ×2 IMPLANT
KIT ROOM TURNOVER OR (KITS) ×2 IMPLANT
NEEDLE HYPO 25GX1X1/2 BEV (NEEDLE) ×2 IMPLANT
NS IRRIG 1000ML POUR BTL (IV SOLUTION) ×2 IMPLANT
PACK GENERAL/GYN (CUSTOM PROCEDURE TRAY) ×2 IMPLANT
PACK SURGICAL SETUP 50X90 (CUSTOM PROCEDURE TRAY) ×2 IMPLANT
PAD ARMBOARD 7.5X6 YLW CONV (MISCELLANEOUS) ×4 IMPLANT
PENCIL BUTTON HOLSTER BLD 10FT (ELECTRODE) ×2 IMPLANT
SPECIMEN JAR SMALL (MISCELLANEOUS) ×2 IMPLANT
SPONGE GAUZE 4X4 12PLY (GAUZE/BANDAGES/DRESSINGS) ×2 IMPLANT
SPONGE LAP 18X18 X RAY DECT (DISPOSABLE) ×2 IMPLANT
STOCKINETTE 3IN STRL (GAUZE/BANDAGES/DRESSINGS) ×2 IMPLANT
STRIP CLOSURE SKIN 1/2X4 (GAUZE/BANDAGES/DRESSINGS) ×2 IMPLANT
SUT MON AB 4-0 PC3 18 (SUTURE) ×2 IMPLANT
SUT SILK 2 0 FS (SUTURE) IMPLANT
SUT VIC AB 3-0 SH 27 (SUTURE) ×1
SUT VIC AB 3-0 SH 27X BRD (SUTURE) ×1 IMPLANT
SYR BULB 3OZ (MISCELLANEOUS) IMPLANT
SYR CONTROL 10ML LL (SYRINGE) ×2 IMPLANT
TOWEL OR 17X24 6PK STRL BLUE (TOWEL DISPOSABLE) ×2 IMPLANT
TOWEL OR 17X26 10 PK STRL BLUE (TOWEL DISPOSABLE) ×2 IMPLANT
TUBE CONNECTING 12X1/4 (SUCTIONS) IMPLANT
WATER STERILE IRR 1000ML POUR (IV SOLUTION) IMPLANT
YANKAUER SUCT BULB TIP NO VENT (SUCTIONS) ×2 IMPLANT

## 2011-08-30 NOTE — Anesthesia Preprocedure Evaluation (Signed)
Anesthesia Evaluation  Patient identified by MRN, date of birth, ID band Patient awake    Reviewed: Allergy & Precautions, H&P , NPO status , Patient's Chart, lab work & pertinent test results  Airway Mallampati: II TM Distance: <3 FB Neck ROM: Full    Dental No notable dental hx.    Pulmonary neg pulmonary ROS,  breath sounds clear to auscultation  Pulmonary exam normal       Cardiovascular hypertension, Rhythm:Regular Rate:Normal     Neuro/Psych negative neurological ROS  negative psych ROS   GI/Hepatic negative GI ROS, Neg liver ROS,   Endo/Other  Morbid obesity  Renal/GU negative Renal ROS  negative genitourinary   Musculoskeletal negative musculoskeletal ROS (+)   Abdominal   Peds negative pediatric ROS (+)  Hematology negative hematology ROS (+)   Anesthesia Other Findings   Reproductive/Obstetrics negative OB ROS                           Anesthesia Physical Anesthesia Plan  ASA: II  Anesthesia Plan: General   Post-op Pain Management:    Induction: Intravenous  Airway Management Planned: LMA and Oral ETT  Additional Equipment:   Intra-op Plan:   Post-operative Plan:   Informed Consent: I have reviewed the patients History and Physical, chart, labs and discussed the procedure including the risks, benefits and alternatives for the proposed anesthesia with the patient or authorized representative who has indicated his/her understanding and acceptance.   Dental advisory given  Plan Discussed with: CRNA  Anesthesia Plan Comments:         Anesthesia Quick Evaluation

## 2011-08-30 NOTE — Anesthesia Postprocedure Evaluation (Signed)
  Anesthesia Post-op Note  Patient: Kim Roberts  Procedure(s) Performed: Procedure(s) (LRB): EXCISION MASS (Right)  Patient Location: PACU  Anesthesia Type: General  Level of Consciousness: awake and alert   Airway and Oxygen Therapy: Patient Spontanous Breathing  Post-op Pain: mild  Post-op Assessment: Post-op Vital signs reviewed, Patient's Cardiovascular Status Stable, Respiratory Function Stable, Patent Airway and No signs of Nausea or vomiting  Post-op Vital Signs: stable  Complications: No apparent anesthesia complications

## 2011-08-30 NOTE — H&P (Signed)
Chief Complaint   Patient presents with   .  Other     est pt- pt of dr ww, eval bil. axillary mass    HISTORY:  Pt presents with many year history of R axillary mass. This has been painful and catches in her clothes. She also has issues wearing sleeveless shirts due to the size of the mass. She has had prior infection in the skin overlying the mass. Her mammograms have been negative. She denies fever/chills. She has not had changes with her menstrual cycle.   Past Medical History   Diagnosis  Date   .  Heart murmur      as a child   .  Menopause    .  Hypertension    .  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   .  Night sweats    .  Hyperlipidemia    .  Colon polyp     Past Surgical History   Procedure  Date   .  Colonoscopy    .  Polypectomy     No current outpatient prescriptions on file.   No Known Allergies  Family History   Problem  Relation  Age of Onset   .  Cancer  Mother  45      colon cancer    .  Colon cancer  Mother    .  Cancer  Father  84      lung cancer    .  Seizures  Brother    .  Colon cancer  Brother    .  Diabetes  Paternal Uncle    .  Asthma  Other    .  Esophageal cancer  Neg Hx    .  Stomach cancer  Neg Hx     History    Social History   .  Marital Status:  Single     Spouse Name:  N/A     Number of Children:  N/A   .  Years of Education:  N/A    Social History Main Topics   .  Smoking status:  Never Smoker   .  Smokeless tobacco:  Never Used   .  Alcohol Use:  No      occassionally   .  Drug Use:  No   .  Sexually Active:  Yes     Birth Control/ Protection:  Post-menopausal    Other Topics  Concern   .  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  EXAM:  Filed Vitals:    07/03/11 0858   BP:  152/100   Pulse:  96   Temp:  97.8 F (36.6 C)   Resp:  20   Gen: No acute distress. Well  nourished and well groomed. Obese.  Neurological: Alert and oriented to person, place, and time. Coordination normal.  Head: Normocephalic and atraumatic.  Eyes: Conjunctivae are normal. Pupils are equal, round, and reactive to light. No scleral icterus.  Neck: Normal range of motion. Neck supple. No tracheal deviation or thyromegaly present.  Cardiovascular: Normal rate, regular rhythm, normal heart sounds and intact distal pulses. Exam reveals no gallop and no friction rub. No murmur heard.  Respiratory: Effort normal. No respiratory distress. No chest wall tenderness. Breath sounds normal. No wheezes, rales or rhonchi.  GI:  Soft. Bowel sounds are normal. The abdomen is soft and nontender. There is no rebound and no guarding.  Musculoskeletal: Normal range of motion. Extremities are nontender.  Lymphadenopathy: No cervical, preauricular, postauricular or axillary adenopathy is present Skin: Skin is warm and dry. No rash noted. No diaphoresis. No erythema. No pallor. No clubbing, cyanosis, or edema.  Right axilla with fatty mass that is mobile. Feels like it is 3x4x4 cm in size. No overlying infection. Psychiatric: Normal mood and affect. Behavior is normal. Judgment and thought content normal.   ASSESSMENT AND PLAN:  Axillary mass  Plan excision of R axillary mass.  Feels like lipoma 3x4x4 cm  Discussed incision, recovery time, risk of infection, risk of wound breakdown, risk of bleeding and swelling.  She understands and wishes to proceed.   Maudry Diego MD  Surgical Oncology, General and Endocrine Surgery  [facility]   Visit Diagnoses:  1.  Axillary mass   Primary Care Physician:  Crawford Givens, MD, MD

## 2011-08-30 NOTE — Transfer of Care (Signed)
Immediate Anesthesia Transfer of Care Note  Patient: Kim Roberts  Procedure(s) Performed: Procedure(s) (LRB): EXCISION MASS (Right)  Patient Location: PACU  Anesthesia Type: General  Level of Consciousness: awake, alert  and patient cooperative  Airway & Oxygen Therapy: Patient Spontanous Breathing and Patient connected to face mask oxygen  Post-op Assessment: Report given to PACU RN and Post -op Vital signs reviewed and stable  Post vital signs: Reviewed and stable  Complications: No apparent anesthesia complications

## 2011-08-30 NOTE — Discharge Instructions (Signed)
[facility] Office Phone Number [phone]   POST OP INSTRUCTIONS  Always review your discharge instruction sheet given to you by the facility where your surgery was performed.  IF YOU HAVE DISABILITY OR FAMILY LEAVE FORMS, YOU MUST BRING THEM TO THE OFFICE FOR PROCESSING.  DO NOT GIVE THEM TO YOUR DOCTOR.  1. A prescription for pain medication may be given to you upon discharge.  Take your pain medication as prescribed, if needed.  If narcotic pain medicine is not needed, then you may take acetaminophen (Tylenol) or ibuprofen (Advil) as needed. 2. Take your usually prescribed medications unless otherwise directed 3. If you need a refill on your pain medication, please contact your pharmacy.  They will contact our office to request authorization.  Prescriptions will not be filled after 5pm or on week-ends. 4. You should eat very light the first 24 hours after surgery, such as soup, crackers, pudding, etc.  Resume your normal diet the day after surgery 5. It is common to experience some constipation if taking pain medication after surgery.  Increasing fluid intake and taking a stool softener will usually help or prevent this problem from occurring.  A mild laxative (Milk of Magnesia or Miralax) should be taken according to package directions if there are no bowel movements after 48 hours. 6. You may shower in 48 hours.  The surgical glue will flake off in 2-3 weeks.   7. ACTIVITIES:  No strenuous activity or heavy lifting for 1 week.   a. You may drive when you no longer are taking prescription pain medication, you can comfortably wear a seatbelt, and you can safely maneuver your car and apply brakes. b. RETURN TO WORK:  __________3 weeks_______________ Kim Roberts should see your doctor in the office for a follow-up appointment approximately three-four weeks after your surgery.    WHEN TO CALL YOUR DOCTOR: 1. Fever over 101.0 2. Nausea and/or vomiting. 3. Extreme swelling or  bruising. 4. Continued bleeding from incision. 5. Increased pain, redness, or drainage from the incision.  The clinic staff is available to answer your questions during regular business hours.  Please don't hesitate to call and ask to speak to one of the nurses for clinical concerns.  If you have a medical emergency, go to the nearest emergency room or call 911.  A surgeon from [facility] is always on call at the hospital.  For further questions, please visit [email or web address]

## 2011-08-30 NOTE — Op Note (Signed)
PRE-OPERATIVE DIAGNOSIS: R axillary mass, chronic infections, 2x3x10 cm  POST-OPERATIVE DIAGNOSIS:  Same  PROCEDURE:  Procedure(s): Removal of right axillary mass  SURGEON:  Surgeon(s): Almond Lint, MD  ANESTHESIA:   local and general  DRAINS: none   LOCAL MEDICATIONS USED:  MARCAINE    and LIDOCAINE   SPECIMEN:  Source of Specimen:  right axillary mass with excess skin  DISPOSITION OF SPECIMEN:  PATHOLOGY  COUNTS:  YES  PLAN OF CARE: Discharge to home after PACU  PATIENT DISPOSITION:  PACU - hemodynamically stable.   Procedure:   The patient was identified in the holding area and taken to the operating room and placed supine on the operating room table.  The patient's right chest, axilla, and arm were prepped and draped in sterile fashion.  The excess tissue in the armpit was marked over the fatty mass and elevated with Allis clamps.  The skin was incised with the #10 blade.  The cautery was used to divide the remainder of the tissue.  The cavity was irrigated, and hemostasis was achieved with the cautery.   The skin was reapproximated with 3-0 Vicryl deep dermal sutures and 4-0 Monocryl running subcuticular suture.  It was cleaned and dried, then dressed with benzoin, steristrips and dry sterile dressing.  Needle and sponge counts are correct.   Pt taken to PACU extubated in stable condition.

## 2011-09-06 ENCOUNTER — Telehealth (INDEPENDENT_AMBULATORY_CARE_PROVIDER_SITE_OTHER): Payer: Self-pay

## 2011-09-06 NOTE — Telephone Encounter (Signed)
LM pt to call for pathology results.

## 2011-09-07 ENCOUNTER — Encounter (HOSPITAL_COMMUNITY): Payer: Self-pay | Admitting: General Surgery

## 2011-09-10 ENCOUNTER — Telehealth (INDEPENDENT_AMBULATORY_CARE_PROVIDER_SITE_OTHER): Payer: Self-pay | Admitting: General Surgery

## 2011-09-10 NOTE — Telephone Encounter (Signed)
Pt needs post op appointment within two weeks.  She would also like to return to work no later than 09/24/11.

## 2011-09-18 ENCOUNTER — Encounter (INDEPENDENT_AMBULATORY_CARE_PROVIDER_SITE_OTHER): Payer: PRIVATE HEALTH INSURANCE | Admitting: General Surgery

## 2011-09-24 ENCOUNTER — Ambulatory Visit (INDEPENDENT_AMBULATORY_CARE_PROVIDER_SITE_OTHER): Payer: PRIVATE HEALTH INSURANCE | Admitting: General Surgery

## 2011-09-24 ENCOUNTER — Encounter (INDEPENDENT_AMBULATORY_CARE_PROVIDER_SITE_OTHER): Payer: Self-pay | Admitting: General Surgery

## 2011-09-24 ENCOUNTER — Encounter (INDEPENDENT_AMBULATORY_CARE_PROVIDER_SITE_OTHER): Payer: Self-pay

## 2011-09-24 VITALS — BP 152/84 | HR 72 | Temp 97.6°F | Resp 18 | Ht 66.0 in | Wt 244.1 lb

## 2011-09-24 DIAGNOSIS — R2230 Localized swelling, mass and lump, unspecified upper limb: Secondary | ICD-10-CM

## 2011-09-24 DIAGNOSIS — R229 Localized swelling, mass and lump, unspecified: Secondary | ICD-10-CM

## 2011-09-24 NOTE — Patient Instructions (Signed)
Stretch R arm repeatedly over the day.  Take ibuprofen   Two more weeks off work as pt's job requires repeated up and down motions.    Follow up as needed if pain not improving.

## 2011-09-24 NOTE — Assessment & Plan Note (Signed)
Pt with sebaceous gland hyperplasia.  Still with significant soreness and tightness of R axilla.  Will increase stretching regimen and give 2 additional weeks off work.  Follow up as needed if pain does not improve.

## 2011-09-24 NOTE — Progress Notes (Signed)
HISTORY:  The patient is 2- 3 weeks status post excision of right axillary mass. 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 She has overall been doing reasonably well but continues to be very sore and tied in the right axilla. She is having difficulty keeping her arm elevated. She cannot get her arm all  the way over her head. She denies fevers or chills. She is having some swelling in the axilla.   EXAM: General:  Alert and oriented Incision:  Good healing, no seroma, scar slightly tight with arm abducted.   PATHOLOGY: Sweat gland hyperplasia.   ASSESSMENT AND PLAN:   Axillary mass Pt with sebaceous gland hyperplasia.  Still with significant soreness and tightness of R axilla.  Will increase stretching regimen and give 2 additional weeks off work.  Follow up as needed if pain does not improve.         Maudry Diego, MD Surgical Oncology, General & Endocrine Surgery [facility]  Crawford Givens, MD, MD Joaquim Nam, MD

## 2013-02-10 ENCOUNTER — Ambulatory Visit (INDEPENDENT_AMBULATORY_CARE_PROVIDER_SITE_OTHER): Payer: 59 | Admitting: Family Medicine

## 2013-02-10 ENCOUNTER — Encounter: Payer: Self-pay | Admitting: Family Medicine

## 2013-02-10 VITALS — BP 128/78 | HR 94 | Temp 98.3°F | Ht 66.0 in | Wt 253.5 lb

## 2013-02-10 DIAGNOSIS — I10 Essential (primary) hypertension: Secondary | ICD-10-CM | POA: Insufficient documentation

## 2013-02-10 DIAGNOSIS — E663 Overweight: Secondary | ICD-10-CM

## 2013-02-10 DIAGNOSIS — R5381 Other malaise: Secondary | ICD-10-CM

## 2013-02-10 MED ORDER — HYDROCHLOROTHIAZIDE 25 MG PO TABS: 25.0000 mg | ORAL_TABLET | Freq: Every day | ORAL | Status: DC

## 2013-02-10 NOTE — Progress Notes (Signed)
Hypertension: Using medication without problems or lightheadedness: yes Chest pain with exertion:no Edema:no Short of breath:no  Fatigue, going on for years.  Not having periods. No known blood in stool.  [facility], hgb ~12, per patient report. No clear single source. "It may be from being overweight."  She is working weekends, not a swing shift.  She is happy and not depressed, but tired.  She has had some hot flashes for the last few years.  She feels better when she gets more exercise, ie walking.   She prefers to get her labs done at work.   Meds, vitals, and allergies reviewed.   PMH and SH reviewed  ROS: See HPI.  Otherwise negative.    GEN: nad, alert and oriented, overweight.  HEENT: mucous membranes moist NECK: supple w/o LA, no TMG, acanthosis nigricans noted on the back of the neck CV: rrr. PULM: ctab, no inc wob ABD: soft, +bs EXT: no edema SKIN: no acute rash

## 2013-02-10 NOTE — Assessment & Plan Note (Signed)
Continue HCTZ, work on diet/weight/exercise.  She agrees.  Check bmet/lipid- order written.

## 2013-02-10 NOTE — Assessment & Plan Note (Signed)
Inc exercise, check cbc with diff and TSH- order written for her to check at work. She agrees.  See notes on labs when resulted.

## 2013-02-10 NOTE — Patient Instructions (Signed)
Get the labs faxed over to me.  Don't change your HCTZ dose for now.  Walk more for exercise.  Take care.

## 2013-02-10 NOTE — Assessment & Plan Note (Signed)
D/w pt about diet and exercise.   

## 2013-02-14 ENCOUNTER — Other Ambulatory Visit: Payer: Self-pay | Admitting: Family Medicine

## 2013-02-14 LAB — COMPREHENSIVE METABOLIC PANEL
Chloride: 104 mmol/L (ref 98–107)
Co2: 30 mmol/L (ref 21–32)
Creatinine: 0.79 mg/dL (ref 0.60–1.30)
EGFR (African American): >60
Glucose: 97 mg/dL (ref 65–99)
Potassium: 3.8 mmol/L (ref 3.5–5.1)
SGOT(AST): 83 U/L — ABNORMAL HIGH (ref 15–37)
Sodium: 138 mmol/L (ref 136–145)
Total Protein: 7.4 g/dL (ref 6.4–8.2)

## 2013-02-14 LAB — CBC WITH DIFFERENTIAL/PLATELET
Lymphocytes: 46 %
MCH: 31 pg (ref 26.0–34.0)
MCHC: 34.4 g/dL (ref 32.0–36.0)
Monocytes: 6 %
RBC: 4.21 10*6/uL (ref 3.80–5.20)
RDW: 15.1 % — ABNORMAL HIGH (ref 11.5–14.5)
Segmented Neutrophils: 39 %

## 2013-02-14 LAB — LIPID PANEL
Cholesterol: 209 mg/dL — ABNORMAL HIGH (ref 0–200)
HDL Cholesterol: 47 mg/dL (ref 40–60)
VLDL Cholesterol, Calc: 59 mg/dL — ABNORMAL HIGH (ref 5–40)

## 2013-02-16 ENCOUNTER — Encounter: Payer: Self-pay | Admitting: Family Medicine

## 2013-02-17 ENCOUNTER — Encounter: Payer: Self-pay | Admitting: *Deleted

## 2013-03-11 ENCOUNTER — Telehealth: Payer: Self-pay

## 2013-03-11 NOTE — Telephone Encounter (Signed)
Pt was requesting refill HCTZ; spoke with [facility] pharmacist and rx ready to pick up from 02/10/13 refill. Pt advised and will pick up med.

## 2013-04-16 ENCOUNTER — Other Ambulatory Visit: Payer: Self-pay

## 2014-04-12 ENCOUNTER — Other Ambulatory Visit: Payer: Self-pay | Admitting: Family Medicine

## 2014-04-13 ENCOUNTER — Encounter: Payer: Self-pay | Admitting: Family Medicine

## 2014-04-13 ENCOUNTER — Ambulatory Visit (INDEPENDENT_AMBULATORY_CARE_PROVIDER_SITE_OTHER): Payer: 59 | Admitting: Family Medicine

## 2014-04-13 VITALS — BP 120/80 | HR 88 | Temp 98.6°F | Wt 260.0 lb

## 2014-04-13 DIAGNOSIS — M722 Plantar fascial fibromatosis: Secondary | ICD-10-CM

## 2014-04-13 NOTE — Patient Instructions (Signed)
Use arch supports if needed. Ibuprofen 2-3 tabs up to 2-3 times a day with food. Use the stretch and this should improve.  If not, then call to see Dr. Lorelei Pont.

## 2014-04-13 NOTE — Progress Notes (Signed)
Pre visit review using our clinic review tool, if applicable. No additional management support is needed unless otherwise documented below in the visit note.  B heel pain.  Pain when getting out of bed.  The same pain on each foot.  On the bottom of the foot. On her feet a lot at work, has good shoes. No trauma.  goin on for about 6-7 months, "and it isn't getting better."    Meds, vitals, and allergies reviewed.   ROS: See HPI.  Otherwise, noncontributory.  nad Normal inspection of feet except for B loss of arch No bruising no edema Normal DP pulse B Sensation wnl B  B plantar fascia origin ttp.

## 2014-04-14 DIAGNOSIS — M722 Plantar fascial fibromatosis: Secondary | ICD-10-CM | POA: Insufficient documentation

## 2014-04-14 NOTE — Assessment & Plan Note (Signed)
D/w pt.  Handout given with info and stretching.  Use arch supports, exercises/stretches on handout, if not improved then she'll call back about f/u with Dr. Lorelei Pont.  She agrees.

## 2014-05-21 ENCOUNTER — Other Ambulatory Visit: Payer: Self-pay | Admitting: Family Medicine

## 2014-05-21 NOTE — Telephone Encounter (Signed)
Electronic refill request.   Last refill 30 tablet 0 RF on 04/12/2014    Requested OV at last RF.  Last OV:  04/13/14 for plantar fascitis.  Last CPE 04/16/13.  Please advise.

## 2014-05-23 NOTE — Telephone Encounter (Signed)
Sent, please get her scheduled for CPE.  Thanks.

## 2014-05-24 NOTE — Telephone Encounter (Signed)
Patient advised.   Pt will check her work schedule and call back for CPE appt.

## 2014-07-07 ENCOUNTER — Other Ambulatory Visit: Payer: Self-pay

## 2014-07-07 MED ORDER — HYDROCHLOROTHIAZIDE 25 MG PO TABS: 25.0000 mg | ORAL_TABLET | Freq: Every day | ORAL | Status: DC

## 2014-07-07 NOTE — Telephone Encounter (Signed)
Pt left v/m requesting refill HCTZ to [place]; pt last seen 02/10/13 for BP ck and sick visit on 04/13/14; pt has scheduled CPX on 08/12/14. Med last filled on 05/23/14 with 0 refill.Please advise. Pt has 2 pills left and request cb when refilled.

## 2014-07-07 NOTE — Telephone Encounter (Signed)
Sent. Thanks.   

## 2014-08-12 ENCOUNTER — Ambulatory Visit (INDEPENDENT_AMBULATORY_CARE_PROVIDER_SITE_OTHER): Payer: 59 | Admitting: Family Medicine

## 2014-08-12 ENCOUNTER — Encounter: Payer: Self-pay | Admitting: Family Medicine

## 2014-08-12 ENCOUNTER — Telehealth: Payer: Self-pay | Admitting: Family Medicine

## 2014-08-12 VITALS — BP 102/64 | HR 107 | Temp 98.7°F | Ht 64.5 in | Wt 251.5 lb

## 2014-08-12 DIAGNOSIS — Z7189 Other specified counseling: Secondary | ICD-10-CM

## 2014-08-12 DIAGNOSIS — Z0000 Encounter for general adult medical examination without abnormal findings: Secondary | ICD-10-CM

## 2014-08-12 DIAGNOSIS — E663 Overweight: Secondary | ICD-10-CM

## 2014-08-12 DIAGNOSIS — I10 Essential (primary) hypertension: Secondary | ICD-10-CM

## 2014-08-12 MED ORDER — HYDROCHLOROTHIAZIDE 25 MG PO TABS: 25.0000 mg | ORAL_TABLET | Freq: Every day | ORAL | Status: DC

## 2014-08-12 NOTE — Progress Notes (Signed)
Pre visit review using our clinic review tool, if applicable. No additional management support is needed unless otherwise documented below in the visit note.  CPE- See plan.  Routine anticipatory guidance given to patient.  See health maintenance. Tetanus 2012 Flu done at work ~03/2014 PNA not due Shingles not due Pap per gyn, f/u pending.   She'll call about a mammogram.   DXA d/w pt.   Living will d/w pt.  She is going to consider this.   Diet and exercise d/w pt.  Walking for exercise, she joined an exercise class.  Diet d/w pt.  Goal of 1 lb weight loss per month.    Hypertension:    Using medication without problems or lightheadedness: yes Chest pain with exertion: no Edema:no Short of breath:no  PMH and SH reviewed  Meds, vitals, and allergies reviewed.   ROS: See HPI.  Otherwise negative.    GEN: nad, alert and oriented, obese HEENT: mucous membranes moist NECK: supple w/o LA CV: rrr. PULM: ctab, no inc wob ABD: soft, +bs EXT: no edema SKIN: no acute rash

## 2014-08-12 NOTE — Patient Instructions (Addendum)
Call the gyn clinic about a pap smear.  Call about a mammogram.   Schedule a fasting lab appointment.  Keep exercising.   Take care.  Glad to see you.

## 2014-08-12 NOTE — Telephone Encounter (Signed)
emmi mailed  °

## 2014-08-13 DIAGNOSIS — Z7189 Other specified counseling: Secondary | ICD-10-CM | POA: Insufficient documentation

## 2014-08-13 DIAGNOSIS — Z0000 Encounter for general adult medical examination without abnormal findings: Secondary | ICD-10-CM | POA: Insufficient documentation

## 2014-08-13 NOTE — Assessment & Plan Note (Signed)
Goal weight loss 1 lb per month.  D/w pt about diet and exercise.

## 2014-08-13 NOTE — Assessment & Plan Note (Signed)
Goal weight loss 1 lb per month.  D/w pt about diet and exercise.  No change in meds at this point, continue as is.  Return for labs.  She agrees.

## 2014-08-13 NOTE — Assessment & Plan Note (Signed)
Routine anticipatory guidance given to patient. See health maintenance.  Tetanus 2012  Flu done at work ~03/2014 PNA not due  Shingles not due  Pap per gyn, f/u pending.  She'll call about a mammogram.  DXA d/w pt.  Living will d/w pt. She is going to consider this.  Diet and exercise d/w pt. Walking for exercise, she joined an exercise class. Diet d/w pt. Goal of 1 lb weight loss per month.

## 2014-09-23 ENCOUNTER — Other Ambulatory Visit: Payer: Self-pay | Admitting: Family Medicine

## 2014-10-28 ENCOUNTER — Ambulatory Visit: Payer: 59 | Admitting: Obstetrics and Gynecology

## 2014-12-15 ENCOUNTER — Other Ambulatory Visit (INDEPENDENT_AMBULATORY_CARE_PROVIDER_SITE_OTHER): Payer: 59

## 2014-12-15 ENCOUNTER — Telehealth: Payer: Self-pay | Admitting: Family Medicine

## 2014-12-15 ENCOUNTER — Encounter: Payer: Self-pay | Admitting: Obstetrics and Gynecology

## 2014-12-15 ENCOUNTER — Ambulatory Visit (INDEPENDENT_AMBULATORY_CARE_PROVIDER_SITE_OTHER): Payer: 59 | Admitting: Obstetrics and Gynecology

## 2014-12-15 VITALS — BP 121/81 | HR 92 | Ht 65.0 in | Wt 260.0 lb

## 2014-12-15 DIAGNOSIS — Z862 Personal history of diseases of the blood and blood-forming organs and certain disorders involving the immune mechanism: Secondary | ICD-10-CM | POA: Diagnosis not present

## 2014-12-15 DIAGNOSIS — Z01419 Encounter for gynecological examination (general) (routine) without abnormal findings: Secondary | ICD-10-CM | POA: Diagnosis not present

## 2014-12-15 DIAGNOSIS — N898 Other specified noninflammatory disorders of vagina: Secondary | ICD-10-CM

## 2014-12-15 DIAGNOSIS — Z1151 Encounter for screening for human papillomavirus (HPV): Secondary | ICD-10-CM | POA: Diagnosis not present

## 2014-12-15 DIAGNOSIS — Z124 Encounter for screening for malignant neoplasm of cervix: Secondary | ICD-10-CM

## 2014-12-15 DIAGNOSIS — I10 Essential (primary) hypertension: Secondary | ICD-10-CM

## 2014-12-15 DIAGNOSIS — Z113 Encounter for screening for infections with a predominantly sexual mode of transmission: Secondary | ICD-10-CM

## 2014-12-15 LAB — COMPREHENSIVE METABOLIC PANEL
ALBUMIN: 3.9 g/dL (ref 3.5–5.2)
ALK PHOS: 65 U/L (ref 39–117)
ALT: 37 U/L — ABNORMAL HIGH (ref 0–35)
AST: 30 U/L (ref 0–37)
BILIRUBIN TOTAL: 0.5 mg/dL (ref 0.2–1.2)
BUN: 14 mg/dL (ref 6–23)
CO2: 31 meq/L (ref 19–32)
Calcium: 9.4 mg/dL (ref 8.4–10.5)
Chloride: 105 mEq/L (ref 96–112)
Creatinine, Ser: 0.8 mg/dL (ref 0.40–1.20)
GFR: 96.9 mL/min (ref >60.00)
GLUCOSE: 105 mg/dL — AB (ref 70–99)
POTASSIUM: 3.7 meq/L (ref 3.5–5.1)
Sodium: 142 mEq/L (ref 135–145)
TOTAL PROTEIN: 7.1 g/dL (ref 6.0–8.3)

## 2014-12-15 LAB — LIPID PANEL
CHOL/HDL RATIO: 5
Cholesterol: 219 mg/dL — ABNORMAL HIGH (ref 0–200)
HDL: 44.7 mg/dL (ref >39.00)
LDL Cholesterol: 143 mg/dL — ABNORMAL HIGH (ref 0–99)
NONHDL: 174.3
Triglycerides: 155 mg/dL — ABNORMAL HIGH (ref 0.0–149.0)
VLDL: 31 mg/dL (ref 0.0–40.0)

## 2014-12-15 NOTE — Telephone Encounter (Signed)
CBC added.  Thanks.

## 2014-12-15 NOTE — Telephone Encounter (Signed)
-----   Message from Marchia Bond sent at 12/15/2014 10:11 AM EDT ----- Regarding: adding cbc to todays labs Pt wants to know if cbc can be added to labs drawn today? She tried to give blood 6 months ago, but was told her hgb was low. She just wondered what it is now.   Thanks Aniceto Boss

## 2014-12-15 NOTE — Progress Notes (Signed)
  Subjective:     Kim Roberts is a 54 y.o. female postmenopausal for the past 3 years with BMI 43 who is here for a comprehensive physical exam. The patient reports the presence of a non-pruritic, odorless, green discharge for the past few months. Patient is not sexually active. She denies any urinary incontinence  History   Social History  . Marital Status: Single    Spouse Name: N/A  . Number of Children: N/A  . Years of Education: N/A   Occupational History  . Not on file.   Social History Main Topics  . Smoking status: Never Smoker   . Smokeless tobacco: Never Used  . Alcohol Use: 0.0 oz/week    0 Glasses of wine per week     Comment: occassionally  . Drug Use: No  . Sexual Activity: Yes    Birth Control/ Protection: Post-menopausal   Other Topics Concern  . 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 MAMMOGRAM  01/11/2013  . PAP SMEAR  01/09/2014  . INFLUENZA VACCINE  01/10/2015  . COLONOSCOPY  01/22/2016  . TETANUS/TDAP  01/10/2021  . HIV Screening  Completed      Past Medical History  Diagnosis Date  . Heart murmur     as a child  . Menopause   . Hypertension   . 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   . Hyperlipidemia   . Colon polyp    Past Surgical History  Procedure Laterality Date  . Colonoscopy    . Polypectomy    . Mass excision  08/30/2011    Procedure: EXCISION MASS;  Surgeon: Stark Klein, MD;  Location: [facility];  Service: General;  Laterality: Right;  Excision of Right Axillary Mass    Family History  Problem Relation Age of Onset  . Cancer Mother 72    colon cancer  . Colon cancer Mother   . Cancer Father 49    lung cancer  . Seizures Brother   . Colon cancer Brother   . Diabetes Paternal Uncle   . Asthma Other   . Esophageal cancer Neg Hx   . Stomach cancer Neg Hx   . Breast cancer Neg Hx      Review of Systems Pertinent items are noted in HPI.    Objective:      GENERAL: Well-developed, well-nourished female in no acute distress.  HEENT: Normocephalic, atraumatic. Sclerae anicteric.  NECK: Supple. Normal thyroid.  LUNGS: Clear to auscultation bilaterally.  HEART: Regular rate and rhythm. BREASTS: Symmetric in size. No palpable masses or lymphadenopathy, skin changes, or nipple drainage. ABDOMEN: Soft, nontender, nondistended. No organomegaly. PELVIC: Normal external female genitalia. Vagina is pink and rugated.  Thick white discharge. Normal appearing cervix. Uterus is normal in size. No adnexal mass or tenderness. EXTREMITIES: No cyanosis, clubbing, or edema, 2+ distal pulses.    Assessment:    Healthy female exam.      Plan:    pap smear with cultures collected Wet prep collected Screening mammogram ordered Patient advised to perform monthly self breast and vulva exam Patient will be contacted with any abnormal results Patient reports having her fasting labs done with her PCP this morning See After Visit Summary for Counseling Recommendations

## 2014-12-16 LAB — WET PREP, GENITAL
CLUE CELLS WET PREP: NONE SEEN
TRICH WET PREP: NONE SEEN
Yeast Wet Prep HPF POC: NONE SEEN

## 2014-12-16 LAB — CBC WITH DIFFERENTIAL/PLATELET
BASOS PCT: 0.3 % (ref 0.0–3.0)
Basophils Absolute: 0 10*3/uL (ref 0.0–0.1)
EOS ABS: 0.1 10*3/uL (ref 0.0–0.7)
Eosinophils Relative: 1.2 % (ref 0.0–5.0)
HCT: 39.3 % (ref 36.0–46.0)
Hemoglobin: 13 g/dL (ref 12.0–15.0)
Lymphs Abs: 5.7 10*3/uL — ABNORMAL HIGH (ref 0.7–4.0)
MCHC: 33.2 g/dL (ref 30.0–36.0)
MCV: 92.9 fl (ref 78.0–100.0)
Monocytes Absolute: 0.5 10*3/uL (ref 0.1–1.0)
Monocytes Relative: 6.6 % (ref 3.0–12.0)
NEUTROS PCT: 22.5 % — AB (ref 43.0–77.0)
Neutro Abs: 1.9 10*3/uL (ref 1.4–7.7)
Platelets: 322 10*3/uL (ref 150.0–400.0)
RBC: 4.23 Mil/uL (ref 3.87–5.11)
RDW: 15.4 % (ref 11.5–15.5)
WBC: 8.2 10*3/uL (ref 4.0–10.5)

## 2014-12-16 LAB — CYTOLOGY - PAP

## 2014-12-17 ENCOUNTER — Encounter: Payer: Self-pay | Admitting: *Deleted

## 2014-12-22 ENCOUNTER — Telehealth: Payer: Self-pay | Admitting: *Deleted

## 2014-12-22 NOTE — Telephone Encounter (Signed)
-----   Message from Mora Bellman, MD sent at 12/22/2014  4:00 PM EDT ----- Regarding: RE: results Normal results. No intervention needed. Green discharge likely because of WBC. She should start eating yogurts and/or probiotics. Nothing to do. No current infections  Peggy  ----- Message -----    From: Gerri Spore, CMA    Sent: 12/22/2014   3:25 PM      To: Mora Bellman, MD Subject: results                                        Patient called and wanted to know her wet prep results.  Looks normal besides WBC. Patient is still having the cottage cheese green discharge.  Please advise.   Thanks    [phone]

## 2014-12-22 NOTE — Telephone Encounter (Signed)
I made patient aware of Dr. Elly Modena advice.

## 2015-01-04 ENCOUNTER — Ambulatory Visit (HOSPITAL_COMMUNITY)
Admission: RE | Admit: 2015-01-04 | Discharge: 2015-01-04 | Disposition: A | Discharge status: Home / Self Care | Payer: 59 | Source: Ambulatory Visit | Attending: Obstetrics and Gynecology | Admitting: Obstetrics and Gynecology

## 2015-01-04 DIAGNOSIS — Z1231 Encounter for screening mammogram for malignant neoplasm of breast: Secondary | ICD-10-CM | POA: Insufficient documentation

## 2015-01-04 DIAGNOSIS — Z01419 Encounter for gynecological examination (general) (routine) without abnormal findings: Secondary | ICD-10-CM

## 2015-02-17 ENCOUNTER — Other Ambulatory Visit: Payer: Self-pay

## 2015-06-12 DIAGNOSIS — C50919 Malignant neoplasm of unspecified site of unspecified female breast: Secondary | ICD-10-CM

## 2015-06-12 DIAGNOSIS — Z923 Personal history of irradiation: Secondary | ICD-10-CM

## 2015-06-12 HISTORY — DX: Personal history of irradiation: Z92.3

## 2015-06-12 HISTORY — DX: Malignant neoplasm of unspecified site of unspecified female breast: C50.919

## 2015-06-23 DIAGNOSIS — D239 Other benign neoplasm of skin, unspecified: Secondary | ICD-10-CM | POA: Diagnosis not present

## 2015-06-23 DIAGNOSIS — L821 Other seborrheic keratosis: Secondary | ICD-10-CM | POA: Diagnosis not present

## 2015-06-23 DIAGNOSIS — L309 Dermatitis, unspecified: Secondary | ICD-10-CM | POA: Diagnosis not present

## 2015-12-08 ENCOUNTER — Other Ambulatory Visit: Payer: Self-pay | Admitting: Family Medicine

## 2015-12-08 DIAGNOSIS — Z1231 Encounter for screening mammogram for malignant neoplasm of breast: Secondary | ICD-10-CM

## 2015-12-27 ENCOUNTER — Other Ambulatory Visit: Payer: Self-pay | Admitting: Family Medicine

## 2015-12-28 ENCOUNTER — Encounter: Payer: Self-pay | Admitting: Gastroenterology

## 2016-01-23 ENCOUNTER — Ambulatory Visit (HOSPITAL_BASED_OUTPATIENT_CLINIC_OR_DEPARTMENT_OTHER)
Admission: RE | Admit: 2016-01-23 | Discharge: 2016-01-23 | Disposition: A | Discharge status: Home / Self Care | Payer: 59 | Source: Ambulatory Visit | Attending: Family Medicine | Admitting: Family Medicine

## 2016-01-23 DIAGNOSIS — R921 Mammographic calcification found on diagnostic imaging of breast: Secondary | ICD-10-CM | POA: Diagnosis not present

## 2016-01-23 DIAGNOSIS — Z1231 Encounter for screening mammogram for malignant neoplasm of breast: Secondary | ICD-10-CM | POA: Diagnosis not present

## 2016-01-24 ENCOUNTER — Other Ambulatory Visit: Payer: Self-pay | Admitting: Family Medicine

## 2016-01-24 DIAGNOSIS — R928 Other abnormal and inconclusive findings on diagnostic imaging of breast: Secondary | ICD-10-CM

## 2016-01-27 ENCOUNTER — Encounter: Payer: Self-pay | Admitting: Gastroenterology

## 2016-01-27 ENCOUNTER — Other Ambulatory Visit: Payer: Self-pay | Admitting: Family Medicine

## 2016-01-30 ENCOUNTER — Other Ambulatory Visit: Payer: Self-pay | Admitting: *Deleted

## 2016-01-30 ENCOUNTER — Ambulatory Visit (INDEPENDENT_AMBULATORY_CARE_PROVIDER_SITE_OTHER): Payer: 59 | Admitting: Family Medicine

## 2016-01-30 ENCOUNTER — Encounter: Payer: Self-pay | Admitting: Family Medicine

## 2016-01-30 VITALS — BP 112/72 | HR 104 | Temp 99.1°F | Wt 268.0 lb

## 2016-01-30 DIAGNOSIS — E663 Overweight: Secondary | ICD-10-CM

## 2016-01-30 DIAGNOSIS — R609 Edema, unspecified: Secondary | ICD-10-CM

## 2016-01-30 LAB — BASIC METABOLIC PANEL
BUN: 17 mg/dL (ref 6–23)
CALCIUM: 9.4 mg/dL (ref 8.4–10.5)
CO2: 31 mEq/L (ref 19–32)
Chloride: 99 mEq/L (ref 96–112)
Creatinine, Ser: 0.98 mg/dL (ref 0.40–1.20)
GFR: 76.33 mL/min (ref >60.00)
GLUCOSE: 118 mg/dL — AB (ref 70–99)
Potassium: 3.5 mEq/L (ref 3.5–5.1)
SODIUM: 139 meq/L (ref 135–145)

## 2016-01-30 MED ORDER — HYDROCHLOROTHIAZIDE 25 MG PO TABS: 25.0000 mg | ORAL_TABLET | Freq: Every day | ORAL | 11 refills | Status: DC

## 2016-01-30 NOTE — Progress Notes (Signed)
Pre visit review using our clinic review tool, if applicable. No additional management support is needed unless otherwise documented below in the visit note. 

## 2016-01-30 NOTE — Progress Notes (Signed)
She was having leg and foot pain.  She called about that on Friday.  It is better today.  She works during the week, nights, minimal sitting at work.  More swelling and pain during the week, as the week goes along.  She tends do better early in the week before her weekly shifts start.    No FCNAVD.  No rash.  Tried compression stockings, but no consistently.    Due for BMET.  D/w pt.  We'll done today on the way out.   D/w pt about weight loss and she was interested in bariatric intervention.  D/w pt.  See AVS.   Meds, vitals, and allergies reviewed.   ROS: Per HPI unless specifically indicated in ROS section   GEN: nad, alert and oriented HEENT: mucous membranes moist NECK: supple w/o LA CV: rrr PULM: ctab, no inc wob ABD: soft, +bs EXT: no edema Normal BP and radial pulses.  Calf not ttp B

## 2016-01-30 NOTE — Assessment & Plan Note (Signed)
She is going to check on the bariatric information session and go from there. Discussed with patient in general, phone number provided.

## 2016-01-30 NOTE — Patient Instructions (Addendum)
Go to the lab on the way out.  We'll contact you with your lab report. I would try compression stockings consistently.  Take care.  Glad to see you.  Update [place] as needed.   [email or web address]      [place] for a weight-loss seminar in [place], or watch our online webinar; for more information, call [phone].     [place] for a weight-loss seminar in [place]; for more information, call the [place] at [phone].

## 2016-01-30 NOTE — Assessment & Plan Note (Signed)
Bilateral leg edema, worse as the work week goes on. She does better on the weekend, and early in the week. Discussed with patient. Check BMET today. See notes on labs. Weight loss would help. Compression stockings may be useful, she'll need to wear this on a more consistent basis. Discussed with patient. She has no significant varicose veins or other compelling indication for vascular referral this point. She has normal dorsalis pedis pulses. She'll update me as needed.

## 2016-02-01 ENCOUNTER — Other Ambulatory Visit: Payer: Self-pay

## 2016-02-01 ENCOUNTER — Other Ambulatory Visit: Payer: Self-pay | Admitting: Family Medicine

## 2016-02-01 ENCOUNTER — Telehealth: Payer: Self-pay | Admitting: Family Medicine

## 2016-02-01 DIAGNOSIS — R928 Other abnormal and inconclusive findings on diagnostic imaging of breast: Secondary | ICD-10-CM

## 2016-02-01 NOTE — Telephone Encounter (Signed)
F[facility],called to get a new order.  Patient needs to be Diagnostic Mammogram Left Breast due to abnormal screening.

## 2016-02-01 NOTE — Telephone Encounter (Signed)
I think the new orders are in now.  Let me know if they need anything else.  Thanks.

## 2016-02-02 ENCOUNTER — Ambulatory Visit
Admission: RE | Admit: 2016-02-02 | Discharge: 2016-02-02 | Disposition: A | Discharge status: Home / Self Care | Payer: 59 | Source: Ambulatory Visit | Attending: Family Medicine | Admitting: Family Medicine

## 2016-02-02 ENCOUNTER — Other Ambulatory Visit: Payer: Self-pay | Admitting: Family Medicine

## 2016-02-02 DIAGNOSIS — R921 Mammographic calcification found on diagnostic imaging of breast: Secondary | ICD-10-CM | POA: Diagnosis not present

## 2016-02-02 DIAGNOSIS — R928 Other abnormal and inconclusive findings on diagnostic imaging of breast: Secondary | ICD-10-CM

## 2016-02-10 ENCOUNTER — Other Ambulatory Visit: Payer: Self-pay | Admitting: Family Medicine

## 2016-02-10 ENCOUNTER — Ambulatory Visit
Admission: RE | Admit: 2016-02-10 | Discharge: 2016-02-10 | Disposition: A | Discharge status: Home / Self Care | Payer: 59 | Source: Ambulatory Visit | Attending: Family Medicine | Admitting: Family Medicine

## 2016-02-10 DIAGNOSIS — D0512 Intraductal carcinoma in situ of left breast: Secondary | ICD-10-CM | POA: Diagnosis not present

## 2016-02-10 DIAGNOSIS — R928 Other abnormal and inconclusive findings on diagnostic imaging of breast: Secondary | ICD-10-CM

## 2016-02-10 DIAGNOSIS — R921 Mammographic calcification found on diagnostic imaging of breast: Secondary | ICD-10-CM | POA: Diagnosis not present

## 2016-02-15 ENCOUNTER — Telehealth: Payer: Self-pay | Admitting: *Deleted

## 2016-02-15 NOTE — Telephone Encounter (Signed)
Left message for a return phone call to schedule for [facility] 02/22/16.

## 2016-02-17 ENCOUNTER — Telehealth: Payer: Self-pay

## 2016-02-17 NOTE — Telephone Encounter (Signed)
Left detailed message on voicemail.  

## 2016-02-17 NOTE — Telephone Encounter (Signed)
Please call pt.  She has been scheduled for [facility] on March 03, 2016.  They should be setting her up with all of the needed docs/clinics, including a breast surgeon (usually a doc from [facility], and in all of whom I have great confidence and respect).

## 2016-02-17 NOTE — Telephone Encounter (Signed)
Pt said when she spoke with Dr Damita Dunnings about possible breast surgery he gave her the name of a surgeon he would recommend and now pt cannot find that name. Pt request cb today with name of surgeon Dr Damita Dunnings recommends.

## 2016-02-20 ENCOUNTER — Telehealth: Payer: Self-pay | Admitting: *Deleted

## 2016-02-20 NOTE — Telephone Encounter (Signed)
Received call from patient requesting Dr. Barry Dienes for clinic.  Informed patient that Dr. Barry Dienes is not in am clinic and she could be referred to her 1st.  Patient would like to do this.  Informed the breast center to refer her to Dr. Barry Dienes and informed the patient she will get a call with an appointment.

## 2016-02-22 ENCOUNTER — Ambulatory Visit: Payer: 59 | Admitting: Physical Therapy

## 2016-02-22 ENCOUNTER — Telehealth: Payer: Self-pay | Admitting: *Deleted

## 2016-02-22 ENCOUNTER — Ambulatory Visit: Admission: RE | Admit: 2016-02-22 | Payer: 59 | Source: Ambulatory Visit | Admitting: Radiation Oncology

## 2016-02-22 NOTE — Telephone Encounter (Signed)
Left vm to discuss appt with Dr. Barry Dienes on 9.15/17. Contact information provided.

## 2016-02-24 ENCOUNTER — Encounter: Payer: Self-pay | Admitting: *Deleted

## 2016-02-24 ENCOUNTER — Other Ambulatory Visit: Payer: Self-pay | Admitting: General Surgery

## 2016-02-24 DIAGNOSIS — C50412 Malignant neoplasm of upper-outer quadrant of left female breast: Secondary | ICD-10-CM

## 2016-02-27 ENCOUNTER — Telehealth: Payer: Self-pay | Admitting: Hematology

## 2016-02-27 ENCOUNTER — Encounter: Payer: Self-pay | Admitting: Hematology

## 2016-02-27 NOTE — Telephone Encounter (Signed)
Appointment scheduled with Dr. Burr Medico for 9/26 at 11am. Patient aware to arrive 30 minutes early. Demographics verified. Letter mailed to the patient.

## 2016-03-02 ENCOUNTER — Other Ambulatory Visit: Payer: Self-pay | Admitting: General Surgery

## 2016-03-02 DIAGNOSIS — C50412 Malignant neoplasm of upper-outer quadrant of left female breast: Secondary | ICD-10-CM

## 2016-03-06 ENCOUNTER — Encounter: Payer: Self-pay | Admitting: Hematology

## 2016-03-06 ENCOUNTER — Ambulatory Visit (HOSPITAL_BASED_OUTPATIENT_CLINIC_OR_DEPARTMENT_OTHER): Payer: 59 | Admitting: Hematology

## 2016-03-06 ENCOUNTER — Telehealth: Payer: Self-pay | Admitting: Hematology

## 2016-03-06 DIAGNOSIS — D0512 Intraductal carcinoma in situ of left breast: Secondary | ICD-10-CM

## 2016-03-06 DIAGNOSIS — I10 Essential (primary) hypertension: Secondary | ICD-10-CM | POA: Diagnosis not present

## 2016-03-06 DIAGNOSIS — E669 Obesity, unspecified: Secondary | ICD-10-CM | POA: Diagnosis not present

## 2016-03-06 DIAGNOSIS — Z853 Personal history of malignant neoplasm of breast: Secondary | ICD-10-CM | POA: Insufficient documentation

## 2016-03-06 DIAGNOSIS — C50412 Malignant neoplasm of upper-outer quadrant of left female breast: Secondary | ICD-10-CM

## 2016-03-06 NOTE — Progress Notes (Signed)
[place]  Telephone:([phone] [phone]) [phone]  Clinic New Consult Note   Patient Care Team: Tonia Ghent, MD as PCP - General (Family Medicine) 03/06/2016  REFERRAL PHYSICIAN: Dr. Barry Dienes   CHIEF COMPLAINTS/PURPOSE OF CONSULTATION:  Newly diagnosed left rest DCIS  Oncology History   Breast cancer of upper-outer quadrant of left female breast [facility])   Staging form: Breast, AJCC 7th Edition   - Clinical stage from 02/10/2016: Stage 0 (Tis (DCIS), N0, M0) - Signed by Truitt Merle, MD on 03/06/2016      Breast cancer of upper-outer quadrant of left female breast ([place])   02/02/2016 Mammogram    Diagnostic mammogram showed 2 adjacent group of calcifications in the upper outer quadrant of left breast, suspicious for malignancy.      02/10/2016 Initial Diagnosis    Breast cancer of upper-outer quadrant of left female breast ([place])      02/10/2016 Initial Biopsy    Left breast biopsy showed high grade DCIS . There is a small focus suspicious for early invasion.       02/10/2016 Receptors her2    ER negative, PR negative       HISTORY OF PRESENTING ILLNESS:  Kim Roberts 55 y.o. female is here because of Her recently diagnosed left breast DCIS. She presents to my clinic to herself today.  This was discovered by screening mammogram. She had no palpable mass, skin change or nipple discharge. She denies any new symptoms. Her mammogram on 02/02/2016 showed 2 adjacent group of calcifications in the upper outer quadrant of left breast, suspicious for malignancy. She underwent core needle biopsy of the left breast lesion, which showed high-grade DCIS. ER and PR negative.  Patient had no prior breast biopsy or abnormal mammogram. 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 She is single, lives alone, had no pregnancy. Family history was negative for breast cancer, but her mother and brother had colon cancer. She is post  menopause.  GYN HISTORY  Menarchal: 16 LMP: 50  Contraceptive: 27 years HRT: no  G0P0:   MEDICAL HISTORY:  Past Medical History:  Diagnosis Date  . Colon polyp   . Heart murmur    as a child  . Hyperlipidemia   . Hypertension   . Menopause   . 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     SURGICAL HISTORY: Past Surgical History:  Procedure Laterality Date  . COLONOSCOPY    . MASS EXCISION  08/30/2011   Procedure: EXCISION MASS;  Surgeon: Stark Klein, MD;  Location: [facility];  Service: General;  Laterality: Right;  Excision of Right Axillary Mass   . POLYPECTOMY      SOCIAL HISTORY: Social History   Social History  . Marital status: Single    Spouse name: N/A  . Number of children: N/A  . Years of education: N/A   Occupational History  . Not on file.   Social History Main Topics  . Smoking status: Never Smoker  . Smokeless tobacco: Never Used  . Alcohol use 0.0 oz/week     Comment: occassionally  . Drug use: No  . Sexual activity: Yes    Birth control/ protection: Post-menopausal   Other Topics Concern  . 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 Cancer Mother 6    colon cancer  .  Colon cancer Mother   . Cancer Father 31    lung cancer  . Seizures Brother   . Colon cancer Brother   . Diabetes Paternal Uncle   . Asthma Other   . Esophageal cancer Neg Hx   . Stomach cancer Neg Hx   . Breast cancer Neg Hx     ALLERGIES:  is allergic to latex.  MEDICATIONS:  Current Outpatient Prescriptions  Medication Sig Dispense Refill  . hydrochlorothiazide (HYDRODIURIL) 25 MG tablet Take 1 tablet (25 mg total) by mouth daily. 30 tablet 11  . Multiple Vitamin (MULTIVITAMIN) tablet Take 1 tablet by mouth daily.     No current facility-administered medications for this visit.     REVIEW OF SYSTEMS:   Constitutional: Denies fevers, chills or abnormal night  sweats Eyes: Denies blurriness of vision, double vision or watery eyes Ears, nose, mouth, throat, and face: Denies mucositis or sore throat Respiratory: Denies cough, dyspnea or wheezes Cardiovascular: Denies palpitation, chest discomfort or lower extremity swelling Gastrointestinal:  Denies nausea, heartburn or change in bowel habits Skin: Denies abnormal skin rashes Lymphatics: Denies new lymphadenopathy or easy bruising Neurological:Denies numbness, tingling or new weaknesses Behavioral/Psych: Mood is stable, no new changes  All other systems were reviewed with the patient and are negative.  PHYSICAL EXAMINATION: ECOG PERFORMANCE STATUS: 0 - Asymptomatic  Vitals:   03/06/16 1117  BP: 135/85  Pulse: 89  Resp: 18  Temp: 98.8 F (37.1 C)   Filed Weights   03/06/16 1117  Weight: 265 lb 11.2 oz (120.5 kg)    GENERAL:alert, no distress and comfortable SKIN: skin color, texture, turgor are normal, no rashes or significant lesions EYES: normal, conjunctiva are pink and non-injected, sclera clear OROPHARYNX:no exudate, no erythema and lips, buccal mucosa, and tongue normal  NECK: supple, thyroid normal size, non-tender, without nodularity LYMPH:  no palpable lymphadenopathy in the cervical, axillary or inguinal LUNGS: clear to auscultation and percussion with normal breathing effort HEART: regular rate & rhythm and no murmurs and no lower extremity edema ABDOMEN:abdomen soft, non-tender and normal bowel sounds Musculoskeletal:no cyanosis of digits and no clubbing  PSYCH: alert & oriented x 3 with fluent speech NEURO: no focal motor/sensory deficits Breasts: Breast inspection showed them to be symmetrical with no nipple discharge. Palpation of the breasts and axilla revealed no obvious mass that I could appreciate.   LABORATORY DATA:  I have reviewed the data as listed CBC Latest Ref Rng & Units 12/15/2014 02/14/2013 08/21/2011  WBC 4.0 - 10.5 K/uL 8.2 9.0 7.1  Hemoglobin 12.0 -  15.0 g/dL 13.0 13.0 12.9  Hematocrit 36.0 - 46.0 % 39.3 37.9 38.6  Platelets 150.0 - 400.0 K/uL 322.0 304 313   CMP Latest Ref Rng & Units 01/30/2016 12/15/2014 02/14/2013  Glucose 70 - 99 mg/dL 118(H) 105(H) 97  BUN 6 - 23 mg/dL 17 14 20(H)  Creatinine 0.40 - 1.20 mg/dL 0.98 0.80 0.79  Sodium 135 - 145 mEq/L 139 142 138  Potassium 3.5 - 5.1 mEq/L 3.5 3.7 3.8  Chloride 96 - 112 mEq/L 99 105 104  CO2 19 - 32 mEq/L _0 Calcium 8.4 - 10.5 mg/dL 9.4 9.4 9.4  Total Protein 6.0 - 8.3 g/dL - 7.1 7.4  Total Bilirubin 0.2 - 1.2 mg/dL - 0.5 0.6  Alkaline Phos 39 - 117 U/L - 65 89  AST 0 - 37 U/L - 30 83(H)  ALT 0 - 35 U/L - 37(H) 90(H)   PATHOLOGY REPORT  Diagnosis 02/10/2016  Breast, left, needle core biopsy, upper outer quadrant - 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 Microscopic Comment There is a small focus suspicious for early invasion. Prognostic markers will be ordered. Dr. Lyndon Code has reviewed the case. The case was called to [facility] on 02/14/2016.  Results: IMMUNOHISTOCHEMICAL AND MORPHOMETRIC ANALYSIS PERFORMED MANUALLY Estrogen Receptor: 0%, NEGATIVE Progesterone Receptor: 0%, NEGATIVE  RADIOGRAPHIC STUDIES: I have personally reviewed the radiological images as listed and agreed with the findings in the report. Mm Digital Diagnostic Unilat L  Result Date: 02/10/2016 CLINICAL DATA:  Post biopsy mammogram of the left breast for clip placement. EXAM: DIAGNOSTIC LEFT MAMMOGRAM POST STEREOTACTIC BIOPSY COMPARISON:  Previous exam(s). FINDINGS: Mammographic images were obtained following stereotactic guided biopsy of calcifications in the upper-outer quadrant of the left breast. The X shaped biopsy marking clip is appropriately positioned at the intended site of biopsy in the upper-outer left breast. IMPRESSION: Appropriate positioning of the X shaped biopsy marking clip at the site of biopsied calcifications in the  upper-outer left breast. Final Assessment: Post Procedure Mammograms for Marker Placement Electronically Signed   By: Ammie Ferrier M.D.   On: 02/10/2016 12:16   Mm Lt Breast Bx W Loc Dev 1st Lesion Image Bx Spec Stereo Guide  Addendum Date: 02/17/2016   ADDENDUM REPORT: 02/15/2016 10:39 ADDENDUM: Pathology revealed high grade ductal carcinoma in situ with calcifications and necrosis in the left breast. This was found to be concordant by Dr. Ammie Ferrier. Pathology results were discussed with the patient by telephone. Note that there are two adjacent groups of similar appearing calcifications spanning 2.0 cm all together per prior report, and that one of the two groups was biopsied. The second group of calcifications should be excised at the time of surgery. The patient reported doing well after the biopsy. Post biopsy instructions and care were reviewed and questions were answered. The patient was encouraged to call [facility] for any additional concerns. The patient requested care in [facility] and has been scheduled for [facility] on March 03, 2016. Pathology results reported by Susa Raring RN, BSN on 02/15/2016. Electronically Signed   By: Ammie Ferrier M.D.   On: 02/15/2016 10:39   Result Date: 02/17/2016 CLINICAL DATA:  55 year old female presenting for stereotactic biopsy of left breast calcifications. EXAM: LEFT BREAST STEREOTACTIC CORE NEEDLE BIOPSY COMPARISON:  Previous exams. FINDINGS: The patient and I discussed the procedure of stereotactic-guided biopsy including benefits and alternatives. We discussed the high likelihood of a successful procedure. We discussed the risks of the procedure including infection, bleeding, tissue injury, clip migration, and inadequate sampling. Informed written consent was given. The usual time out protocol was performed immediately prior to the procedure. Using sterile technique and 1%  Lidocaine as local anesthetic, under stereotactic guidance, a 9 gauge vacuum assisted device was used to perform core needle biopsy of calcifications in the upper-outer quadrant of the left breast using a lateral approach. Specimen radiograph was performed showing calcifications within multiple core samples. Specimens with calcifications are identified for pathology. At the conclusion of the procedure, a X shaped tissue marker clip was deployed into the biopsy cavity. Follow-up 2-view mammogram was performed and dictated separately. IMPRESSION: Stereotactic-guided biopsy of calcifications in the upper-outer quadrant of the left breast. No apparent complications. Electronically Signed: By: Ammie Ferrier M.D. On: 02/10/2016 12:10   SCREENING MAMMOGRAM 01/23/2016 FINDINGS: In the left breast, calcifications warrant further evaluation.  In the right breast, no findings suspicious for malignancy. Images were processed with CAD.  IMPRESSION: Further evaluation is suggested for calcifications in the left breast.  RECOMMENDATION: Diagnostic mammogram of the left breast. (Code:FI-L-78M)  ASSESSMENT & PLAN:   55 year old post menopause woman, with screening discovered left breast DCIS.   1. 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 -She is a candidate for breast conservation surgery. She has been seen by breast surgeon Dr. Barry Dienes who recommends lumpectomy. -Given her family history of colon cancer, she was referred to genetic testing to ruled out inheritable cancer syndrome  -Her DCIS will be cured by complete surgical resection. Any form of adjuvant therapy is preventive. -Given her negative ER and PR, the benefit of chemotherapy prevention with antiestrogen therapy is controversial. I do not recommend. -She will certainly benefit from breast radiation  if she undergo lumpectomy to decrease the risk of breast cancer. -We also discussed that biopsy may have sampling limitation, we will review her surgical path, to see if she has any invasive carcinoma components. Her biopsy showed small focus suspicious for early invasion. -We discussed breast cancer surveillance after she completes treatment, Including annual mammogram, breast exam every 6-12 months. -I encouraged her to have healthy diet, exercise regularly, and try to lose some weight after her breast surgery.  2. HTN, obesity  -She will follow-up with her primary care physician  Plan -She will likely have lumpectomy soon -I'll see her in 3 weeks after her surgery to review her pathology result and follow up.    No orders of the defined types were placed in this encounter.   All questions were answered. The patient knows to call the clinic with any problems, questions or concerns. I spent 45 minutes counseling the patient face to face. The total time spent in the appointment was 55 minutes and more than 50% was on counseling.     Truitt Merle, MD 03/06/2016 11:45 AM

## 2016-03-06 NOTE — Telephone Encounter (Signed)
Gave patient avs report and appointments for October  °

## 2016-03-08 ENCOUNTER — Encounter: Payer: Self-pay | Admitting: Hematology

## 2016-03-12 ENCOUNTER — Encounter (HOSPITAL_BASED_OUTPATIENT_CLINIC_OR_DEPARTMENT_OTHER): Payer: Self-pay | Admitting: *Deleted

## 2016-03-14 ENCOUNTER — Ambulatory Visit: Payer: 59

## 2016-03-14 ENCOUNTER — Ambulatory Visit: Payer: 59 | Admitting: Radiation Oncology

## 2016-03-16 ENCOUNTER — Encounter (HOSPITAL_BASED_OUTPATIENT_CLINIC_OR_DEPARTMENT_OTHER)
Admission: RE | Admit: 2016-03-16 | Discharge: 2016-03-16 | Disposition: A | Discharge status: Home / Self Care | Payer: 59 | Source: Ambulatory Visit | Attending: General Surgery | Admitting: General Surgery

## 2016-03-16 ENCOUNTER — Ambulatory Visit
Admission: RE | Admit: 2016-03-16 | Discharge: 2016-03-16 | Disposition: A | Discharge status: Home / Self Care | Payer: 59 | Source: Ambulatory Visit | Attending: General Surgery | Admitting: General Surgery

## 2016-03-16 DIAGNOSIS — Z836 Family history of other diseases of the respiratory system: Secondary | ICD-10-CM | POA: Diagnosis not present

## 2016-03-16 DIAGNOSIS — Z6841 Body Mass Index (BMI) 40.0 and over, adult: Secondary | ICD-10-CM | POA: Diagnosis not present

## 2016-03-16 DIAGNOSIS — I10 Essential (primary) hypertension: Secondary | ICD-10-CM | POA: Diagnosis not present

## 2016-03-16 DIAGNOSIS — Z800 Family history of malignant neoplasm of digestive organs: Secondary | ICD-10-CM | POA: Diagnosis not present

## 2016-03-16 DIAGNOSIS — E7800 Pure hypercholesterolemia, unspecified: Secondary | ICD-10-CM | POA: Diagnosis not present

## 2016-03-16 DIAGNOSIS — C50412 Malignant neoplasm of upper-outer quadrant of left female breast: Secondary | ICD-10-CM

## 2016-03-16 DIAGNOSIS — Z8249 Family history of ischemic heart disease and other diseases of the circulatory system: Secondary | ICD-10-CM | POA: Diagnosis not present

## 2016-03-16 DIAGNOSIS — D0512 Intraductal carcinoma in situ of left breast: Secondary | ICD-10-CM | POA: Diagnosis not present

## 2016-03-16 LAB — BASIC METABOLIC PANEL
Anion gap: 10 (ref 5–15)
BUN: 17 mg/dL (ref 6–20)
CALCIUM: 9.4 mg/dL (ref 8.9–10.3)
CHLORIDE: 102 mmol/L (ref 101–111)
CO2: 29 mmol/L (ref 22–32)
CREATININE: 0.85 mg/dL (ref 0.44–1.00)
GFR calc non Af Amer: >60 mL/min (ref >60)
Glucose, Bld: 102 mg/dL — ABNORMAL HIGH (ref 65–99)
Potassium: 3.6 mmol/L (ref 3.5–5.1)
SODIUM: 141 mmol/L (ref 135–145)

## 2016-03-16 NOTE — Progress Notes (Signed)
Boost drink given with instructions to complete by 1215, pt verbalized understanding.

## 2016-03-19 ENCOUNTER — Ambulatory Visit (HOSPITAL_BASED_OUTPATIENT_CLINIC_OR_DEPARTMENT_OTHER): Payer: 59 | Admitting: Anesthesiology

## 2016-03-19 ENCOUNTER — Ambulatory Visit
Admission: RE | Admit: 2016-03-19 | Discharge: 2016-03-19 | Disposition: A | Discharge status: Home / Self Care | Payer: 59 | Source: Ambulatory Visit | Attending: General Surgery | Admitting: General Surgery

## 2016-03-19 ENCOUNTER — Encounter (HOSPITAL_BASED_OUTPATIENT_CLINIC_OR_DEPARTMENT_OTHER)
Admission: RE | Disposition: A | Discharge status: Home / Self Care | Payer: Self-pay | Source: Ambulatory Visit | Attending: General Surgery

## 2016-03-19 ENCOUNTER — Ambulatory Visit (HOSPITAL_BASED_OUTPATIENT_CLINIC_OR_DEPARTMENT_OTHER)
Admission: RE | Admit: 2016-03-19 | Discharge: 2016-03-19 | Disposition: A | Discharge status: Home / Self Care | Payer: 59 | Source: Ambulatory Visit | Attending: General Surgery | Admitting: General Surgery

## 2016-03-19 ENCOUNTER — Encounter (HOSPITAL_BASED_OUTPATIENT_CLINIC_OR_DEPARTMENT_OTHER): Payer: Self-pay | Admitting: *Deleted

## 2016-03-19 DIAGNOSIS — I10 Essential (primary) hypertension: Secondary | ICD-10-CM | POA: Insufficient documentation

## 2016-03-19 DIAGNOSIS — D0512 Intraductal carcinoma in situ of left breast: Secondary | ICD-10-CM | POA: Diagnosis not present

## 2016-03-19 DIAGNOSIS — Z800 Family history of malignant neoplasm of digestive organs: Secondary | ICD-10-CM | POA: Diagnosis not present

## 2016-03-19 DIAGNOSIS — Z6841 Body Mass Index (BMI) 40.0 and over, adult: Secondary | ICD-10-CM | POA: Insufficient documentation

## 2016-03-19 DIAGNOSIS — Z836 Family history of other diseases of the respiratory system: Secondary | ICD-10-CM | POA: Insufficient documentation

## 2016-03-19 DIAGNOSIS — E7800 Pure hypercholesterolemia, unspecified: Secondary | ICD-10-CM | POA: Insufficient documentation

## 2016-03-19 DIAGNOSIS — Z8249 Family history of ischemic heart disease and other diseases of the circulatory system: Secondary | ICD-10-CM | POA: Insufficient documentation

## 2016-03-19 DIAGNOSIS — K219 Gastro-esophageal reflux disease without esophagitis: Secondary | ICD-10-CM | POA: Diagnosis not present

## 2016-03-19 DIAGNOSIS — C50412 Malignant neoplasm of upper-outer quadrant of left female breast: Secondary | ICD-10-CM

## 2016-03-19 HISTORY — PX: BREAST LUMPECTOMY: SHX2

## 2016-03-19 SURGERY — BREAST LUMPECTOMY
Anesthesia: General | Site: Breast | Laterality: Left

## 2016-03-19 MED ORDER — SCOPOLAMINE 1 MG/3DAYS TD PT72: 1.0000 | MEDICATED_PATCH | Freq: Once | TRANSDERMAL | Status: DC | PRN

## 2016-03-19 MED ORDER — CELECOXIB 400 MG PO CAPS
400.0000 mg | ORAL_CAPSULE | ORAL | Status: AC
  Administered 2016-03-19: 400 mg via ORAL

## 2016-03-19 MED ORDER — CELECOXIB 200 MG PO CAPS
ORAL_CAPSULE | ORAL | Status: AC
  Filled 2016-03-19: qty 2

## 2016-03-19 MED ORDER — GLYCOPYRROLATE 0.2 MG/ML IJ SOLN: 0.2000 mg | Freq: Once | INTRAMUSCULAR | Status: DC | PRN

## 2016-03-19 MED ORDER — LIDOCAINE HCL 1 % IJ SOLN
INTRAMUSCULAR | Status: DC | PRN
  Administered 2016-03-19: 10 mL via INTRAMUSCULAR

## 2016-03-19 MED ORDER — ACETAMINOPHEN 325 MG PO TABS: 650.0000 mg | ORAL_TABLET | ORAL | Status: DC | PRN

## 2016-03-19 MED ORDER — FENTANYL CITRATE (PF) 100 MCG/2ML IJ SOLN
INTRAMUSCULAR | Status: AC
  Filled 2016-03-19: qty 2

## 2016-03-19 MED ORDER — FENTANYL CITRATE (PF) 100 MCG/2ML IJ SOLN
50.0000 ug | INTRAMUSCULAR | Status: AC | PRN
  Administered 2016-03-19: 50 ug via INTRAVENOUS
  Administered 2016-03-19: 25 ug via INTRAVENOUS
  Administered 2016-03-19: 100 ug via INTRAVENOUS
  Administered 2016-03-19: 25 ug via INTRAVENOUS

## 2016-03-19 MED ORDER — DEXAMETHASONE SODIUM PHOSPHATE 10 MG/ML IJ SOLN
INTRAMUSCULAR | Status: AC
  Filled 2016-03-19: qty 1

## 2016-03-19 MED ORDER — LACTATED RINGERS IV SOLN: INTRAVENOUS | Status: DC

## 2016-03-19 MED ORDER — ACETAMINOPHEN 650 MG RE SUPP: 650.0000 mg | RECTAL | Status: DC | PRN

## 2016-03-19 MED ORDER — MIDAZOLAM HCL 2 MG/2ML IJ SOLN
INTRAMUSCULAR | Status: AC
  Filled 2016-03-19: qty 2

## 2016-03-19 MED ORDER — LACTATED RINGERS IV SOLN
INTRAVENOUS | Status: DC
  Administered 2016-03-19: 14:00:00 via INTRAVENOUS

## 2016-03-19 MED ORDER — PROPOFOL 10 MG/ML IV BOLUS
INTRAVENOUS | Status: DC | PRN
  Administered 2016-03-19: 200 mg via INTRAVENOUS

## 2016-03-19 MED ORDER — CEFAZOLIN SODIUM-DEXTROSE 2-4 GM/100ML-% IV SOLN
2.0000 g | INTRAVENOUS | Status: AC
  Administered 2016-03-19: 2 g via INTRAVENOUS

## 2016-03-19 MED ORDER — DEXAMETHASONE SODIUM PHOSPHATE 4 MG/ML IJ SOLN
INTRAMUSCULAR | Status: DC | PRN
  Administered 2016-03-19: 10 mg via INTRAVENOUS

## 2016-03-19 MED ORDER — GABAPENTIN 300 MG PO CAPS
300.0000 mg | ORAL_CAPSULE | ORAL | Status: AC
  Administered 2016-03-19: 300 mg via ORAL

## 2016-03-19 MED ORDER — MEPERIDINE HCL 25 MG/ML IJ SOLN: 6.2500 mg | INTRAMUSCULAR | Status: DC | PRN

## 2016-03-19 MED ORDER — MIDAZOLAM HCL 2 MG/2ML IJ SOLN
1.0000 mg | INTRAMUSCULAR | Status: DC | PRN
  Administered 2016-03-19: 2 mg via INTRAVENOUS

## 2016-03-19 MED ORDER — ONDANSETRON HCL 4 MG/2ML IJ SOLN
INTRAMUSCULAR | Status: DC | PRN
  Administered 2016-03-19: 4 mg via INTRAVENOUS

## 2016-03-19 MED ORDER — EPHEDRINE SULFATE 50 MG/ML IJ SOLN
INTRAMUSCULAR | Status: DC | PRN
  Administered 2016-03-19: 10 mg via INTRAVENOUS

## 2016-03-19 MED ORDER — CEFAZOLIN SODIUM-DEXTROSE 2-4 GM/100ML-% IV SOLN
INTRAVENOUS | Status: AC
  Filled 2016-03-19: qty 100

## 2016-03-19 MED ORDER — ONDANSETRON HCL 4 MG/2ML IJ SOLN
INTRAMUSCULAR | Status: AC
  Filled 2016-03-19: qty 2

## 2016-03-19 MED ORDER — OXYCODONE HCL 5 MG PO TABS
5.0000 mg | ORAL_TABLET | ORAL | Status: DC | PRN
  Administered 2016-03-19: 5 mg via ORAL

## 2016-03-19 MED ORDER — SODIUM CHLORIDE 0.9 % IV SOLN: 250.0000 mL | INTRAVENOUS | Status: DC | PRN

## 2016-03-19 MED ORDER — LIDOCAINE 2% (20 MG/ML) 5 ML SYRINGE
INTRAMUSCULAR | Status: AC
  Filled 2016-03-19: qty 5

## 2016-03-19 MED ORDER — SODIUM CHLORIDE 0.9% FLUSH: 3.0000 mL | Freq: Two times a day (BID) | INTRAVENOUS | Status: DC

## 2016-03-19 MED ORDER — OXYCODONE HCL 5 MG PO TABS: 5.0000 mg | ORAL_TABLET | Freq: Four times a day (QID) | ORAL | 0 refills | Status: DC | PRN

## 2016-03-19 MED ORDER — PROPOFOL 10 MG/ML IV BOLUS
INTRAVENOUS | Status: AC
  Filled 2016-03-19: qty 20

## 2016-03-19 MED ORDER — METOCLOPRAMIDE HCL 5 MG/ML IJ SOLN: 10.0000 mg | Freq: Once | INTRAMUSCULAR | Status: DC | PRN

## 2016-03-19 MED ORDER — 0.9 % SODIUM CHLORIDE (POUR BTL) OPTIME
TOPICAL | Status: DC | PRN
  Administered 2016-03-19: 200 mL

## 2016-03-19 MED ORDER — ACETAMINOPHEN 500 MG PO TABS
ORAL_TABLET | ORAL | Status: AC
  Filled 2016-03-19: qty 2

## 2016-03-19 MED ORDER — SODIUM CHLORIDE 0.9% FLUSH: 3.0000 mL | INTRAVENOUS | Status: DC | PRN

## 2016-03-19 MED ORDER — CHLORHEXIDINE GLUCONATE CLOTH 2 % EX PADS: 6.0000 | MEDICATED_PAD | Freq: Once | CUTANEOUS | Status: DC

## 2016-03-19 MED ORDER — GABAPENTIN 300 MG PO CAPS
ORAL_CAPSULE | ORAL | Status: AC
  Filled 2016-03-19: qty 1

## 2016-03-19 MED ORDER — OXYCODONE HCL 5 MG PO TABS
ORAL_TABLET | ORAL | Status: AC
  Filled 2016-03-19: qty 1

## 2016-03-19 MED ORDER — FENTANYL CITRATE (PF) 100 MCG/2ML IJ SOLN: 25.0000 ug | INTRAMUSCULAR | Status: DC | PRN

## 2016-03-19 MED ORDER — LIDOCAINE 2% (20 MG/ML) 5 ML SYRINGE
INTRAMUSCULAR | Status: DC | PRN
  Administered 2016-03-19: 50 mg via INTRAVENOUS

## 2016-03-19 MED ORDER — ACETAMINOPHEN 500 MG PO TABS
1000.0000 mg | ORAL_TABLET | ORAL | Status: AC
  Administered 2016-03-19: 1000 mg via ORAL

## 2016-03-19 MED FILL — oxyCODONE HCL 5 MG TABS: 5 | 3 days supply | Qty: 20 | Fill #0

## 2016-03-19 SURGICAL SUPPLY — 58 items
BINDER BREAST XXLRG (GAUZE/BANDAGES/DRESSINGS) ×2 IMPLANT
BLADE SURG 10 STRL SS (BLADE) IMPLANT
BLADE SURG 15 STRL LF DISP TIS (BLADE) ×2 IMPLANT
BLADE SURG 15 STRL SS (BLADE) ×2
CANISTER SUC SOCK COL 7IN (MISCELLANEOUS) IMPLANT
CANISTER SUCT 1200ML W/VALVE (MISCELLANEOUS) ×2 IMPLANT
CHLORAPREP W/TINT 26ML (MISCELLANEOUS) ×2 IMPLANT
CLIP TI LARGE 6 (CLIP) ×2 IMPLANT
COVER BACK TABLE 60X90IN (DRAPES) ×2 IMPLANT
COVER MAYO STAND STRL (DRAPES) ×2 IMPLANT
COVER PROBE W GEL 5X96 (DRAPES) ×2 IMPLANT
DECANTER SPIKE VIAL GLASS SM (MISCELLANEOUS) IMPLANT
DERMABOND ADVANCED (GAUZE/BANDAGES/DRESSINGS) ×1
DERMABOND ADVANCED .7 DNX12 (GAUZE/BANDAGES/DRESSINGS) ×1 IMPLANT
DEVICE DUBIN W/COMP PLATE 8390 (MISCELLANEOUS) ×2 IMPLANT
DRAPE LAPAROSCOPIC ABDOMINAL (DRAPES) ×2 IMPLANT
DRAPE UTILITY XL STRL (DRAPES) ×2 IMPLANT
DRSG PAD ABDOMINAL 8X10 ST (GAUZE/BANDAGES/DRESSINGS) ×2 IMPLANT
ELECT COATED BLADE 2.86 ST (ELECTRODE) ×2 IMPLANT
ELECT REM PT RETURN 9FT ADLT (ELECTROSURGICAL) ×2
ELECTRODE REM PT RTRN 9FT ADLT (ELECTROSURGICAL) ×1 IMPLANT
GLOVE BIO SURGEON STRL SZ 6 (GLOVE) ×2 IMPLANT
GLOVE BIOGEL PI IND STRL 6.5 (GLOVE) ×1 IMPLANT
GLOVE BIOGEL PI IND STRL 7.0 (GLOVE) ×1 IMPLANT
GLOVE BIOGEL PI INDICATOR 6.5 (GLOVE) ×1
GLOVE BIOGEL PI INDICATOR 7.0 (GLOVE) ×1
GLOVE SURG SS PI 6.0 STRL IVOR (GLOVE) ×4 IMPLANT
GLOVE SURG SS PI 7.0 STRL IVOR (GLOVE) ×2 IMPLANT
GLOVE SURG SS PI 7.5 STRL IVOR (GLOVE) ×2 IMPLANT
GOWN STRL REUS W/ TWL LRG LVL3 (GOWN DISPOSABLE) ×1 IMPLANT
GOWN STRL REUS W/ TWL XL LVL3 (GOWN DISPOSABLE) ×1 IMPLANT
GOWN STRL REUS W/TWL 2XL LVL3 (GOWN DISPOSABLE) ×2 IMPLANT
GOWN STRL REUS W/TWL LRG LVL3 (GOWN DISPOSABLE) ×1
GOWN STRL REUS W/TWL XL LVL3 (GOWN DISPOSABLE) ×1
ILLUMINATOR WAVEGUIDE N/F (MISCELLANEOUS) IMPLANT
KIT MARKER MARGIN INK (KITS) ×2 IMPLANT
LIGHT WAVEGUIDE WIDE FLAT (MISCELLANEOUS) ×2 IMPLANT
NEEDLE HYPO 25X1 1.5 SAFETY (NEEDLE) ×2 IMPLANT
NS IRRIG 1000ML POUR BTL (IV SOLUTION) ×2 IMPLANT
PACK BASIN DAY SURGERY FS (CUSTOM PROCEDURE TRAY) ×2 IMPLANT
PENCIL BUTTON HOLSTER BLD 10FT (ELECTRODE) ×2 IMPLANT
SLEEVE SCD COMPRESS KNEE MED (MISCELLANEOUS) ×2 IMPLANT
SPONGE GAUZE 4X4 12PLY STER LF (GAUZE/BANDAGES/DRESSINGS) ×2 IMPLANT
SPONGE LAP 18X18 X RAY DECT (DISPOSABLE) ×2 IMPLANT
STAPLER VISISTAT 35W (STAPLE) IMPLANT
STRIP CLOSURE SKIN 1/2X4 (GAUZE/BANDAGES/DRESSINGS) ×2 IMPLANT
SUT MON AB 4-0 PC3 18 (SUTURE) ×2 IMPLANT
SUT SILK 2 0 SH (SUTURE) IMPLANT
SUT VIC AB 2-0 SH 18 (SUTURE) IMPLANT
SUT VIC AB 3-0 SH 27 (SUTURE)
SUT VIC AB 3-0 SH 27X BRD (SUTURE) IMPLANT
SUT VICRYL 3-0 CR8 SH (SUTURE) ×2 IMPLANT
SYR BULB 3OZ (MISCELLANEOUS) ×2 IMPLANT
SYR CONTROL 10ML LL (SYRINGE) ×2 IMPLANT
TOWEL OR 17X24 6PK STRL BLUE (TOWEL DISPOSABLE) ×2 IMPLANT
TOWEL OR NON WOVEN STRL DISP B (DISPOSABLE) ×2 IMPLANT
TUBE CONNECTING 20X1/4 (TUBING) ×2 IMPLANT
YANKAUER SUCT BULB TIP NO VENT (SUCTIONS) ×2 IMPLANT

## 2016-03-19 NOTE — Discharge Instructions (Addendum)
[facility] °Office Phone Number [phone] ° °BREAST BIOPSY/ PARTIAL MASTECTOMY: POST OP INSTRUCTIONS ° °Always review your discharge instruction sheet given to you by the facility where your surgery was performed. ° °IF YOU HAVE DISABILITY OR FAMILY LEAVE FORMS, YOU MUST BRING THEM TO THE OFFICE FOR PROCESSING.  DO NOT GIVE THEM TO YOUR DOCTOR. ° °1. A prescription for pain medication may be given to you upon discharge.  Take your pain medication as prescribed, if needed.  If narcotic pain medicine is not needed, then you may take acetaminophen (Tylenol) or ibuprofen (Advil) as needed. °2. Take your usually prescribed medications unless otherwise directed °3. If you need a refill on your pain medication, please contact your pharmacy.  They will contact our office to request authorization.  Prescriptions will not be filled after 5pm or on week-ends. °4. You should eat very light the first 24 hours after surgery, such as soup, crackers, pudding, etc.  Resume your normal diet the day after surgery. °5. Most patients will experience some swelling and bruising in the breast.  Ice packs and a good support bra will help.  Swelling and bruising can take several days to resolve.  °6. It is common to experience some constipation if taking pain medication after surgery.  Increasing fluid intake and taking a stool softener will usually help or prevent this problem from occurring.  A mild laxative (Milk of Magnesia or Miralax) should be taken according to package directions if there are no bowel movements after 48 hours. °7. Unless discharge instructions indicate otherwise, you may remove your bandages 48 hours after surgery, and you may shower at that time.  You may have steri-strips (small skin tapes) in place directly over the incision.  These strips should be left on the skin for 7-10 days.   Any sutures or staples will be removed at the office during your follow-up visit. °8. ACTIVITIES:  You may resume  regular daily activities (gradually increasing) beginning the next day.  Wearing a good support bra or sports bra (or the breast binder) minimizes pain and swelling.  You may have sexual intercourse when it is comfortable. °a. You may drive when you no longer are taking prescription pain medication, you can comfortably wear a seatbelt, and you can safely maneuver your car and apply brakes. °b. RETURN TO WORK:  __________1 week_______________ °9. You should see your doctor in the office for a follow-up appointment approximately two weeks after your surgery.  Your doctor’s nurse will typically make your follow-up appointment when she calls you with your pathology report.  Expect your pathology report 2-3 business days after your surgery.  You may call to check if you do not hear from us after three days. ° ° °WHEN TO CALL YOUR DOCTOR: °1. Fever over 101.0 °2. Nausea and/or vomiting. °3. Extreme swelling or bruising. °4. Continued bleeding from incision. °5. Increased pain, redness, or drainage from the incision. ° °The clinic staff is available to answer your questions during regular business hours.  Please don’t hesitate to call and ask to speak to one of the nurses for clinical concerns.  If you have a medical emergency, go to the nearest emergency room or call 911.  A surgeon from [facility] is always on call at the hospital. ° °For further questions, please visit [email or web address]  ° ° °Post Anesthesia Home Care Instructions ° °Activity: °Get plenty of rest for the remainder of the day. A responsible adult should stay with you for 24   hours following the procedure.  °For the next 24 hours, DO NOT: °-Drive a car °-Operate machinery °-Drink alcoholic beverages °-Take any medication unless instructed by your physician °-Make any legal decisions or sign important papers. ° °Meals: °Start with liquid foods such as gelatin or soup. Progress to regular foods as tolerated. Avoid greasy, spicy, heavy  foods. If nausea and/or vomiting occur, drink only clear liquids until the nausea and/or vomiting subsides. Call your physician if vomiting continues. ° °Special Instructions/Symptoms: °Your throat may feel dry or sore from the anesthesia or the breathing tube placed in your throat during surgery. If this causes discomfort, gargle with warm salt water. The discomfort should disappear within 24 hours. ° °If you had a scopolamine patch placed behind your ear for the management of post- operative nausea and/or vomiting: ° °1. The medication in the patch is effective for 72 hours, after which it should be removed.  Wrap patch in a tissue and discard in the trash. Wash hands thoroughly with soap and water. °2. You may remove the patch earlier than 72 hours if you experience unpleasant side effects which may include dry mouth, dizziness or visual disturbances. °3. Avoid touching the patch. Wash your hands with soap and water after contact with the patch. °  ° °

## 2016-03-19 NOTE — Op Note (Signed)
Left Breast Radioactive seed localized lumpectomy  Indications: This patient presents with history of Left breast cancer  Pre-operative Diagnosis: Left breast cancer, upper outer quadrant, cTis  Post-operative Diagnosis: same  Surgeon: Stark Klein   Anesthesia: General endotracheal anesthesia  ASA Class: 3  Procedure Details  The patient was seen in the Holding Room. The risks, benefits, complications, treatment options, and expected outcomes were discussed with the patient. The possibilities of bleeding, infection, the need for additional procedures, failure to diagnose a condition, and creating a complication requiring transfusion or operation were discussed with the patient. The patient concurred with the proposed plan, giving informed consent.  The site of surgery properly noted/marked. The patient was taken to Operating Room # 1, identified, and the procedure verified as Left Breast  Seed localized Lumpectomy. A Time Out was held and the above information confirmed.  The left breast and chest were prepped and draped in standard fashion. The lumpectomy was performed by creating an transverse incision over the lateral breast over the previously placed radioactive seed.  Dissection was carried down to around the point of maximum signal intensity. The cautery was used to perform the dissection.  Hemostasis was achieved with cautery. The edges of the cavity were marked with large clips, with one each medial, lateral, inferior and superior, and posteriorly.   The specimen was inked with the margin marker paint kit.    Specimen radiography confirmed inclusion of the mammographic lesion, the clip, and the seed.  The background signal in the breast was zero.  The wound was irrigated and closed with 3-0 vicryl in layers and 4-0 monocryl subcuticular suture.      Sterile dressings were applied. At the end of the operation, all sponge, instrument, and needle counts were correct.  Findings: grossly  clear surgical margins and no adenopathy.  Anterior margin is skin.    Estimated Blood Loss:  min         Specimens: left breast lumpectomy.           Complications:  None; patient tolerated the procedure well.         Disposition: PACU - hemodynamically stable.         Condition: stable

## 2016-03-19 NOTE — Interval H&P Note (Signed)
History and Physical Interval Note:  03/19/2016 2:29 PM  Kim Roberts  has presented today for surgery, with the diagnosis of LEFT BREAST CANCER UPPER OUTER QUADRANT  The various methods of treatment have been discussed with the patient and family. After consideration of risks, benefits and other options for treatment, the patient has consented to  Procedure(s): BRACKETED SEED LOCALIZED LEFT BREAST LUMPECTOMY (Left) as a surgical intervention .  The patient's history has been reviewed, patient examined, no change in status, stable for surgery.  I have reviewed the patient's chart and labs.  Questions were answered to the patient's satisfaction.     Kim Roberts

## 2016-03-19 NOTE — Anesthesia Preprocedure Evaluation (Signed)
Anesthesia Evaluation  Patient identified by MRN, date of birth, ID band Patient awake    Reviewed: Allergy & Precautions, NPO status , Patient's Chart, lab work & pertinent test results  Airway Mallampati: II  TM Distance: >3 FB Neck ROM: Full    Dental no notable dental hx.    Pulmonary neg pulmonary ROS,    Pulmonary exam normal breath sounds clear to auscultation       Cardiovascular hypertension, Pt. on medications Normal cardiovascular exam Rhythm:Regular Rate:Normal     Neuro/Psych negative neurological ROS  negative psych ROS   GI/Hepatic negative GI ROS, Neg liver ROS, neg GERD  ,  Endo/Other  Morbid obesity  Renal/GU negative Renal ROS  negative genitourinary   Musculoskeletal negative musculoskeletal ROS (+)   Abdominal   Peds negative pediatric ROS (+)  Hematology negative hematology ROS (+)   Anesthesia Other Findings   Reproductive/Obstetrics negative OB ROS                            Anesthesia Physical Anesthesia Plan  ASA: III  Anesthesia Plan: General   Post-op Pain Management:    Induction: Intravenous  Airway Management Planned: LMA  Additional Equipment:   Intra-op Plan:   Post-operative Plan: Extubation in OR  Informed Consent: I have reviewed the patients History and Physical, chart, labs and discussed the procedure including the risks, benefits and alternatives for the proposed anesthesia with the patient or authorized representative who has indicated his/her understanding and acceptance.   Dental advisory given  Plan Discussed with: CRNA  Anesthesia Plan Comments:         Anesthesia Quick Evaluation

## 2016-03-19 NOTE — H&P (Signed)
Kim Roberts 02/24/2016 7:20 AM Location: [facility] Patient #: [id number] DOB: Jun 27, 1960 Single / Language: [name] / Race: Black or African American Female   History of Present Illness Kim Klein MD; 02/24/2016 12:39 PM) The patient is a 55 year old female who presents with breast cancer. Patient is a 55 year old female is referred by Dr. Theda Sers for consultation for new diagnosis of left breast cancer. She presented with screening detected calcifications in the upper outer quadrant of the left breast. There were 2 small areas that were adjacent to each other and spanned a total distance of 2 cm. The anterior group is largest and was 9 mm in greatest dimension. 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 This was hormone receptor negative. The patient denies breast pain. She has no family history of breast cancer, but had a brother and a mother that had colorectal cancer. Menarche was age 50. She is G0P0 with menopause < age 39.   Pt had a right axillary mass removed in 2013 that was negative for malignancy and was sweat gland hyperplasia.   dx mammogram FINDINGS: On today's additional magnification views, 2 adjacent groups of coarse heterogeneous calcifications are confirmed within the upper-outer quadrant of the left breast, at middle depth, overall spanning 2 cm, the larger more anterior group measuring 0.9 cm extent.  Mammographic images were processed with CAD.  IMPRESSION: Two adjacent groups of similar-appearing coarse heterogeneous calcifications confirmed within the upper-outer quadrant of the left breast, overall spanning 2 cm. This is a suspicious finding for which stereotactic-guided biopsy of 1 of the 2 groups is recommended.  RECOMMENDATION: Stereotactic-guided biopsy of the left breast calcifications.  Stereotactic-guided biopsy is scheduled for September 1st.  I have discussed the findings and recommendations  with the patient. Results were also provided in writing at the conclusion of the visit. If applicable, a reminder letter will be sent to the patient regarding the next appointment.  BI-RADS CATEGORY 4: Suspicious abnormality - biopsy should be considered.  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 Microscopic Comment There is a small focus suspicious for early invasion. Prognostic markers will be ordered. Dr. Lyndon Code has reviewed the case. The case was called to [facility] on 02/14/2016. Results: IMMUNOHISTOCHEMICAL AND MORPHOMETRIC ANALYSIS PERFORMED MANUALLY Estrogen Receptor: 0%, NEGATIVE Progesterone Receptor: 0%, NEGATIVE   Other Problems Tawni Pummel, RN; 02/24/2016 7:21 AM) Back Pain Breast Cancer High blood pressure Hypercholesterolemia  Past Surgical History Tawni Pummel, RN; 02/24/2016 7:21 AM) Breast Biopsy Left.  Diagnostic Studies History Tawni Pummel, RN; 02/24/2016 7:21 AM) Colonoscopy 1-5 years ago Mammogram 1-3 years ago Pap Smear 1-5 years ago  Medication History Tawni Pummel, RN; 02/24/2016 7:21 AM) Medications Reconciled  Social History Tawni Pummel, RN; 02/24/2016 7:21 AM) Alcohol use Occasional alcohol use. No drug use Tobacco use Never smoker.  Family History Tawni Pummel, RN; 02/24/2016 7:21 AM) Colon Cancer Mother. Hypertension Father. Rectal Cancer Brother. Respiratory Condition Father.  Pregnancy / Birth History Tawni Pummel, RN; 02/24/2016 7:21 AM) Age at menarche 62 years. Age of menopause <47 Gravida 0 Irregular periods Para 0    Review of Systems Sunday Spillers Ledford RN; 02/24/2016 7:21 AM) General Not Present- Appetite Loss, Chills, Fatigue, Fever, Night Sweats, Weight Gain and Weight Loss. Skin Not Present- Change in Wart/Mole, Dryness, Hives, Jaundice, New  Lesions, Non-Healing Wounds, Rash and Ulcer. HEENT Present- Wears glasses/contact lenses.  Not Present- Earache, Hearing Loss, Hoarseness, Nose Bleed, Oral Ulcers, Ringing in the Ears, Seasonal Allergies, Sinus Pain, Sore Throat, Visual Disturbances and Yellow Eyes. Breast Present- Breast Mass. Not Present- Breast Pain, Nipple Discharge and Skin Changes. Cardiovascular Not Present- Chest Pain, Difficulty Breathing Lying Down, Leg Cramps, Palpitations, Rapid Heart Rate, Shortness of Breath and Swelling of Extremities. Gastrointestinal Not Present- Abdominal Pain, Bloating, Bloody Stool, Change in Bowel Habits, Chronic diarrhea, Constipation, Difficulty Swallowing, Excessive gas, Gets full quickly at meals, Hemorrhoids, Indigestion, Nausea, Rectal Pain and Vomiting. Female Genitourinary Not Present- Frequency, Nocturia, Painful Urination, Pelvic Pain and Urgency. Musculoskeletal Present- Joint Stiffness. Not Present- Back Pain, Joint Pain, Muscle Pain, Muscle Weakness and Swelling of Extremities. Neurological Not Present- Decreased Memory, Fainting, Headaches, Numbness, Seizures, Tingling, Tremor, Trouble walking and Weakness. Psychiatric Not Present- Anxiety, Bipolar, Change in Sleep Pattern, Depression, Fearful and Frequent crying. Hematology Not Present- Blood Thinners, Easy Bruising, Excessive bleeding, Gland problems, HIV and Persistent Infections.  Vitals Kim Klein MD; 02/24/2016 12:23 PM) 02/24/2016 12:23 PM Weight: 266.8 lb Height: 65in Body Surface Area: 2.24 m Body Mass Index: 44.4 kg/m  Temp.: 98.103F  Pulse: 93 (Regular)  Resp.: 16 (Unlabored)  P.OX: 99% (Room air) BP: 121/67 (Sitting, Left Arm, Standard)       Physical Exam Kim Klein MD; 02/24/2016 12:38 PM) General Mental Status-Alert. General Appearance-Consistent with stated age. Hydration-Well hydrated. Voice-Normal.  Head and Neck Head-normocephalic, atraumatic with no lesions or palpable  masses. Trachea-midline. Thyroid Gland Characteristics - normal size and consistency.  Eye Eyeball - Bilateral-Extraocular movements intact. Sclera/Conjunctiva - Bilateral-No scleral icterus.  Chest and Lung Exam Chest and lung exam reveals -quiet, even and easy respiratory effort with no use of accessory muscles and on auscultation, normal breath sounds, no adventitious sounds and normal vocal resonance. Inspection Chest Wall - Normal. Back - normal.  Cardiovascular Cardiovascular examination reveals -normal heart sounds, regular rate and rhythm with no murmurs and normal pedal pulses bilaterally.  Abdomen Inspection Inspection of the abdomen reveals - No Hernias. Palpation/Percussion Palpation and Percussion of the abdomen reveal - Soft, Non Tender, No Rebound tenderness, No Rigidity (guarding) and No hepatosplenomegaly. Auscultation Auscultation of the abdomen reveals - Bowel sounds normal.  Neurologic Neurologic evaluation reveals -alert and oriented x 3 with no impairment of recent or remote memory. Mental Status-Normal.  Musculoskeletal Global Assessment -Note: no gross deformities.  Normal Exam - Left-Upper Extremity Strength Normal and Lower Extremity Strength Normal. Normal Exam - Right-Upper Extremity Strength Normal and Lower Extremity Strength Normal.  Lymphatic Head & Neck  General Head & Neck Lymphatics: Bilateral - Description - Normal. Axillary  General Axillary Region: Bilateral - Description - Normal. Tenderness - Non Tender. Femoral & Inguinal  Generalized Femoral & Inguinal Lymphatics: Bilateral - Description - No Generalized lymphadenopathy.    Assessment & Plan Kim Klein MD; 02/24/2016 12:43 PM) PRIMARY CANCER OF UPPER OUTER QUADRANT OF LEFT FEMALE BREAST (C50.412) Impression: Patient has a new diagnosis of DCIS of the left breast, upper outer quadrant. This is hormone receptor negative. There are 2 adjacent areas. We  will likely need to bracket these areas with seeds. It may be possible to put one in the middle.  The patient has gone make arrangements with family to be with her for the first 24 hours after surgery. We will do a seed or bracketed seed localized lumpectomy at the first available opportunity.  I discussed what surgery would entail. She may end up needing a sentinel lymph node biopsy. 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  I discussed that she has a risk of bleeding, infection, pain, dissatisfaction with her scar, possible risk for additional surgery or procedures, possible need for lymph node biopsy, possible heart or lung complications, possible prolonged time off of work.  I discussed that she would almost definitely need radiation after surgery. She will also need oncology referral.  I have contacted the genetics counselors to see if she qualifies her genetic testing given her family history of colon cancer in 2 first-degree relatives along with personal history of breast cancer.  She will be set up for surgery at the first available opportunity.  50 min spent in evaluation, examination, counseling, and coordination of care. >50% spent in counseling. Current Plans You are being scheduled for surgery - Our schedulers will call you.  You should hear from our office's scheduling department within 5 working days about the location, date, and time of surgery. We try to make accommodations for patient's preferences in scheduling surgery, but sometimes the OR schedule or the surgeon's schedule prevents [place] from making those accommodations.  If you have not heard from our office [phone]) in 5 working days, call the office and ask for your surgeon's nurse.  If you have other questions about your diagnosis, plan, or surgery, call the office and ask for your surgeon's nurse.  Pt Education - [facility] breast cancer surgery:  discussed with patient and provided information. Referred to Radiation Oncology, for evaluation and follow up (Radiation Oncology). Routine.   Signed by Kim Klein, MD (02/24/2016 12:44 PM)

## 2016-03-19 NOTE — Anesthesia Procedure Notes (Signed)
Procedure Name: LMA Insertion Performed by: Donzel Romack W Pre-anesthesia Checklist: Patient identified, Emergency Drugs available, Suction available and Patient being monitored Patient Re-evaluated:Patient Re-evaluated prior to inductionOxygen Delivery Method: Circle system utilized Preoxygenation: Pre-oxygenation with 100% oxygen Intubation Type: IV induction Ventilation: Mask ventilation without difficulty LMA: LMA inserted LMA Size: 4.0 Number of attempts: 1 Placement Confirmation: positive ETCO2 Tube secured with: Tape Dental Injury: Teeth and Oropharynx as per pre-operative assessment        

## 2016-03-19 NOTE — Anesthesia Postprocedure Evaluation (Signed)
Anesthesia Post Note  Patient: Kim Roberts  Procedure(s) Performed: Procedure(s) (LRB): SEED LOCALIZED LEFT BREAST LUMPECTOMY (Left)  Patient location during evaluation: PACU Anesthesia Type: General Level of consciousness: awake and alert and patient cooperative Pain management: pain level controlled Vital Signs Assessment: post-procedure vital signs reviewed and stable Respiratory status: spontaneous breathing and respiratory function stable Cardiovascular status: stable Anesthetic complications: no    Last Vitals:  Vitals:   03/19/16 1615 03/19/16 1630  BP: 114/65 108/66  Pulse: (!) 101 94  Resp: 16 16  Temp:      Last Pain:  Vitals:   03/19/16 1630  TempSrc:   PainSc: [name]

## 2016-03-19 NOTE — Transfer of Care (Signed)
Immediate Anesthesia Transfer of Care Note  Patient: Kim Roberts  Procedure(s) Performed: Procedure(s): SEED LOCALIZED LEFT BREAST LUMPECTOMY (Left)  Patient Location: PACU  Anesthesia Type:General  Level of Consciousness: awake  Airway & Oxygen Therapy: Patient Spontanous Breathing and Patient connected to face mask oxygen  Post-op Assessment: Report given to RN and Post -op Vital signs reviewed and stable  Post vital signs: Reviewed and stable  Last Vitals:  Vitals:   03/19/16 1326  BP: 139/71  Pulse: (!) 110  Resp: 20  Temp: 36.9 C    Last Pain:  Vitals:   03/19/16 1326  TempSrc: Oral         Complications: No apparent anesthesia complications

## 2016-03-20 ENCOUNTER — Encounter (HOSPITAL_BASED_OUTPATIENT_CLINIC_OR_DEPARTMENT_OTHER): Payer: Self-pay | Admitting: General Surgery

## 2016-03-22 ENCOUNTER — Telehealth: Payer: Self-pay | Admitting: *Deleted

## 2016-03-22 NOTE — Telephone Encounter (Signed)
Called pt- explained recommendations per Dr Ardis Hughs- pt declined needing OV at this point - Pt returned verbal understanding--I placed a recall for 07-2016 as per Dr Ardis Hughs recommendation- explained to p[t since surgery this week, radiation pending best to wait --instructed pt she can call jan 2018 and scheduled if she has not heard from [place]  cancelled 10-20 PV and colon 11-3 per Dr Vernon Prey PV

## 2016-03-22 NOTE — Telephone Encounter (Signed)
I don't think it is a good idea for surveillance, screening colonoscopy if she is getting active cancer (radiation) treatment.  Please put her in for colonoscopy recall in 4 months and we'll revisit it at that point. If she prefers, I'm happy to see her in the office to discuss these recommendations but please cancel the colon and previsit appt for now.  Thanks

## 2016-03-22 NOTE — Telephone Encounter (Signed)
Dr Ardis Hughs, this lady is scheduled for a PV 03-30-16 and a colon with you 04-13-16 Friday. 03-19-16 she had a left lumpectomy for Br CA - I called her this morning to see about RS her colon as this is all new and to see how she was feeling.  She states she has a [facility] in her mother and she has a hx of polyps, last colon with you 01-2011. She said she has not received her path report back from her lumpectomy 10-9 but she was thinking she will have to have radiation. She states she wants to come for her procedure 11-3 as scheduled.  She is concerned with her family hx and her personal hx and she does not want to cancel. Is this okay with you? Please advise, thanks for your time, Marijean Niemann

## 2016-03-28 NOTE — Progress Notes (Signed)
Please let patient know that her margins were negative.  She had a teeny tiny microscopic spot of invasion, but I reviewed with oncology and nothing different to do.

## 2016-03-29 NOTE — Progress Notes (Signed)
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 - RESECTION MARGINS ARE NEGATIVE FOR CARCINOMA. - BIOPSY SITE. - FIBROCYSTIC CHANGE.  Receptor Status: ER (NEG), PR (NEG), Her2-neu (POS), Ki- (15%).   Did patient present with symptoms or was this found on screening mammography?: It was found on a screening mammogram.    Past/Anticipated interventions by surgeon, if any: 03/19/16 Left Breast Radioactive seed localized lumpectomy Dr. Barry Dienes  Past/Anticipated interventions by medical oncology, if any:  Dr. Burr Medico 03/06/16 -She is a candidate for breast conservation surgery. She has been seen by breast surgeon Dr. Barry Dienes who recommends lumpectomy. -Given her family history of colon cancer, she was referred to genetic testing to ruled out inheritable cancer syndrome  -Her DCIS will be cured by complete surgical resection. Any form of adjuvant therapy is preventive. -Given her negative ER and PR, the benefit of chemotherapy prevention with antiestrogen therapy is controversial. I do not recommend. -She will certainly benefit from breast radiation if she undergo lumpectomy to decrease the risk of breast cancer. -We also discussed that biopsy may have sampling limitation, we will review her surgical path, to see if she has any invasive carcinoma components. Her biopsy showed small focus suspicious for early invasion.  Her next appointment with Dr. Burr Medico is 04/05/16  Lymphedema issues, if any: She denies.   Pain issues, if any:  She reports pain a 3/10 in her Left Breast related to her Lumpectomy. She is taking ibuprofen 600 mg as needed with relief.   SAFETY  ISSUES:  Prior radiation? No  Pacemaker/ICD? No  Possible current pregnancy? N/A  Is the patient on methotrexate? No  Current Complaints / other details:   GYN HISTORY  Menarchal: 16 LMP: 50  Contraceptive: 25 years HRT: no  G0P0:  BP 135/79   Pulse 93   Temp 98 F (36.7 C)   Ht '5\' 5"'  (1.651 m)   Wt 269 lb 3.2 oz (122.1 kg)   SpO2 100% Comment: room air  BMI 44.80 kg/m    Wt Readings from Last 3 Encounters:  04/04/16 269 lb 3.2 oz (122.1 kg)  03/19/16 265 lb (120.2 kg)  03/06/16 265 lb 11.2 oz (120.5 kg)      [name], Stephani Police, RN 03/29/2016,9:04 AM

## 2016-03-30 ENCOUNTER — Ambulatory Visit (INDEPENDENT_AMBULATORY_CARE_PROVIDER_SITE_OTHER): Payer: 59

## 2016-03-30 DIAGNOSIS — Z23 Encounter for immunization: Secondary | ICD-10-CM

## 2016-04-04 ENCOUNTER — Ambulatory Visit
Admission: RE | Admit: 2016-04-04 | Discharge: 2016-04-04 | Disposition: A | Discharge status: Home / Self Care | Payer: 59 | Source: Ambulatory Visit | Attending: Radiation Oncology | Admitting: Radiation Oncology

## 2016-04-04 ENCOUNTER — Ambulatory Visit: Payer: 59 | Admitting: Radiation Oncology

## 2016-04-04 ENCOUNTER — Ambulatory Visit: Payer: 59

## 2016-04-04 ENCOUNTER — Telehealth: Payer: Self-pay

## 2016-04-04 ENCOUNTER — Encounter: Payer: Self-pay | Admitting: Radiation Oncology

## 2016-04-04 DIAGNOSIS — Z86010 Personal history of colonic polyps: Secondary | ICD-10-CM | POA: Diagnosis not present

## 2016-04-04 DIAGNOSIS — C50412 Malignant neoplasm of upper-outer quadrant of left female breast: Secondary | ICD-10-CM

## 2016-04-04 DIAGNOSIS — I10 Essential (primary) hypertension: Secondary | ICD-10-CM | POA: Diagnosis not present

## 2016-04-04 DIAGNOSIS — Z171 Estrogen receptor negative status [ER-]: Secondary | ICD-10-CM | POA: Insufficient documentation

## 2016-04-04 DIAGNOSIS — A159 Respiratory tuberculosis unspecified: Secondary | ICD-10-CM | POA: Insufficient documentation

## 2016-04-04 DIAGNOSIS — E785 Hyperlipidemia, unspecified: Secondary | ICD-10-CM | POA: Diagnosis not present

## 2016-04-04 DIAGNOSIS — R011 Cardiac murmur, unspecified: Secondary | ICD-10-CM | POA: Diagnosis not present

## 2016-04-04 DIAGNOSIS — Z510 Encounter for antineoplastic radiation therapy: Secondary | ICD-10-CM | POA: Diagnosis not present

## 2016-04-04 NOTE — Progress Notes (Signed)
Radiation Oncology         [phone] ________________________________  Initial outpatient Consultation  Name: Kim Roberts MRN: [id number]  Date: 04/04/2016  DOB: 26-Oct-1960  [id number] Damita Dunnings, MD  Stark Klein, MD   REFERRING PHYSICIAN: Stark Klein, MD  DIAGNOSIS:    ICD-9-CM ICD-10-CM   1. 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 female who presented with a abnormality in left breast on screening mammogram. This revealed calcifications of concern on 01/24/16. Diagnostic mammogram on 02/02/16 revealed a 2 cm span of heterogenous calcifications in the left breast. The span of calcifications consisted of two adjacent groups.  Biopsy of left breast UOQ revealed high grade DCIS with calcifications and necrosis. There was a small focus suspicious for early invasion. Dr. BBarry Dienesperformed lumpectomy on 03/19/16. Of note, her disease is ER (-), PR (-) ,and Her2 (+). The specimen revealed 2.6 cm of high grade DCIS with less than 1 mm of focal micro invasion. Margins are negative by at least 5 mm. The patient was discussed this morning at breast tumor board. Dr. FBurr Medicodoes not recommend chemotherapy or hormone replacement therapy. Her next appointment with Dr. FBurr Medicois 04/05/16.  She denies any lymphedema issues. She notes 3/10 pain in her left breast related to her lumpectomy. She is taking 600 mg of Ibuprofen prn with relief.  GYN HISTORY  Menarchal: 16 LMP: 50  Contraceptive: 25 years HRT: no  She is certain she is not pregnant   I discussed her case with radiology, post operative mammogram is not recommended.  [place] likes [place]  Denies drainage from lumpectomy scar.   PREVIOUS RADIATION  THERAPY: No  PAST MEDICAL HISTORY:  has a past medical history of Colon polyp; Heart murmur; Hyperlipidemia; Hypertension; Menopause; and Tuberculosis.    PAST SURGICAL HISTORY: Past Surgical History:  Procedure Laterality Date  . BREAST LUMPECTOMY Left 03/19/2016   Procedure: SEED LOCALIZED LEFT BREAST LUMPECTOMY;  Surgeon: FStark Klein MD;  [facility]  Service: General;  Laterality: Left;  . COLONOSCOPY    . MASS EXCISION  08/30/2011   Procedure: EXCISION MASS;  Surgeon: FStark Klein MD;  Location: [facility];  Service: General;  Laterality: Right;  Excision of Right Axillary Mass   . POLYPECTOMY      FAMILY HISTORY: family history includes Asthma in her other; Cancer (age of onset: 642 in her mother; Cancer (age of onset: 662 in her father; Colon cancer (age of onset: 560 in her brother; Colon cancer (age of onset: 653 in her mother; Diabetes in her paternal uncle; Seizures in her brother.  SOCIAL HISTORY:  reports that she has never smoked. She has never used smokeless tobacco. She reports that she drinks alcohol. She reports that she does not use drugs.  ALLERGIES: Latex  MEDICATIONS:  Current Outpatient Prescriptions  Medication Sig Dispense Refill  . hydrochlorothiazide (HYDRODIURIL) 25 MG tablet Take 1 tablet (25 mg total) by mouth daily. 30 tablet 11  . Multiple Vitamin (MULTIVITAMIN) tablet Take 1 tablet by mouth daily.    .Marland KitchenoxyCODONE (OXY IR/ROXICODONE) 5 MG immediate release tablet Take 1-2 tablets (5-10 mg total) by mouth every 6 (six) hours as needed for moderate pain, severe pain or breakthrough pain. (  Patient not taking: Reported on 04/04/2016) 20 tablet 0   No current facility-administered medications for this encounter.     REVIEW OF SYSTEMS:  Notable for that above.   PHYSICAL EXAM:  height is _0  (1.651 m) and weight is 269 lb 3.2 oz (122.1 kg). Her temperature is 98 F (36.7 C). Her blood pressure is 135/79 and her pulse is 93. Her oxygen  saturation is 100%.   General: Alert and oriented, in no acute distress HEENT: Head is normocephalic. Extraocular movements are intact. Oropharynx is clear.                Neck: Neck is supple, no palpable cervical or supraclavicular lymphadenopathy. Heart: Regular in rate and rhythm with no murmurs, rubs, or gallops. Chest: Clear to auscultation bilaterally, with no rhonchi, wheezes, or rales. Abdomen: Soft, nontender, nondistended, with no rigidity or guarding. Extremities: No cyanosis or edema.  Lymphatics: see Neck Exam Skin: No concerning lesions. Musculoskeletal: symmetric strength and muscle tone throughout. Neurologic: Cranial nerves II through XII are grossly intact. No obvious focalities. Speech is fluent. Coordination is intact. Psychiatric: Judgment and insight are intact. Affect is appropriate. Breasts: She has a lumpectomy scar at 3 o'clock position of left breast which has healed very well. No palpable masses appreciated in left breast or left axilla. No palpable right breast masses.   ECOG = 0  0 - Asymptomatic (Fully active, able to carry on all predisease activities without restriction)  1 - Symptomatic but completely ambulatory (Restricted in physically strenuous activity but ambulatory and able to carry out work of a light or sedentary nature. For example, light housework, office work)  2 - Symptomatic, <50% in bed during the day (Ambulatory and capable of all self care but unable to carry out any work activities. Up and about more than 50% of waking hours)  3 - Symptomatic, >50% in bed, but not bedbound (Capable of only limited self-care, confined to bed or chair 50% or more of waking hours)  4 - Bedbound (Completely disabled. Cannot carry on any self-care. Totally confined to bed or chair)  5 - Death   Eustace Pen MM, Creech RH, Tormey DC, et al. [phone]). "Toxicity and response criteria of the [facility]". [place] Oncol. 5 (6):  649-55   LABORATORY DATA:  Lab Results  Component Value Date   WBC 8.2 12/15/2014   HGB 13.0 12/15/2014   HCT 39.3 12/15/2014   MCV 92.9 12/15/2014   PLT 322.0 12/15/2014   CMP     Component Value Date/Time   NA 141 03/16/2016 1129   NA 138 02/14/2013 1411   K 3.6 03/16/2016 1129   K 3.8 02/14/2013 1411   CL 102 03/16/2016 1129   CL 104 02/14/2013 1411   CO2 29 03/16/2016 1129   CO2 30 02/14/2013 1411   GLUCOSE 102 (H) 03/16/2016 1129   GLUCOSE 97 02/14/2013 1411   BUN 17 03/16/2016 1129   BUN 20 (H) 02/14/2013 1411   CREATININE 0.85 03/16/2016 1129   CREATININE 0.79 02/14/2013 1411   CALCIUM 9.4 03/16/2016 1129   CALCIUM 9.4 02/14/2013 1411   PROT 7.1 12/15/2014 1038   PROT 7.4 02/14/2013 1411   ALBUMIN 3.9 12/15/2014 1038   ALBUMIN 3.8 02/14/2013 1411   AST 30 12/15/2014 1038   AST 83 (H) 02/14/2013 1411   ALT 37 (H) 12/15/2014 1038   ALT 90 (H) 02/14/2013 1411   ALKPHOS 65 12/15/2014 1038   ALKPHOS 89 02/14/2013  1411   BILITOT 0.5 12/15/2014 1038   BILITOT 0.6 02/14/2013 1411   GFRNONAA >60 03/16/2016 1129   GFRNONAA >60 02/14/2013 1411   GFRAA >60 03/16/2016 1129   GFRAA >60 02/14/2013 1411         RADIOGRAPHY: Mm Breast Surgical Specimen  Result Date: 03/19/2016 CLINICAL DATA:  Ductal carcinoma in situ of the left breast. Radioactive seed localization was performed 03/16/2016. Patient underwent lumpectomy today. EXAM: SPECIMEN RADIOGRAPH OF THE LEFT BREAST COMPARISON:  Previous exam(s). FINDINGS: Status post excision of the left breast. The radioactive seed and biopsy marker clip are present, completely intact, and were marked for pathology. IMPRESSION: Specimen radiograph of the left breast. Electronically Signed   By: Curlene Dolphin M.D.   On: 03/19/2016 15:31   Mm Lt Radioactive Seed Loc Mammo Guide  Result Date: 03/16/2016 CLINICAL DATA:  55 year old female with recently diagnosed ductal carcinoma in situ post stereotactic guided biopsy of  calcifications spanning a distance of approximately 2.5 cm in the upper-outer left breast. EXAM: MAMMOGRAPHIC GUIDED RADIOACTIVE SEED LOCALIZATION OF THE LEFT BREAST COMPARISON:  Previous exam(s). FINDINGS: Patient presents for radioactive seed localization prior to left breast lumpectomy. I met with the patient and we discussed the procedure of seed localization including benefits and alternatives. We discussed the high likelihood of a successful procedure. We discussed the risks of the procedure including infection, bleeding, tissue injury and further surgery. We discussed the low dose of radioactivity involved in the procedure. Informed, written consent was given. The usual time-out protocol was performed immediately prior to the procedure. Using mammographic guidance, sterile technique, 1% lidocaine and an I-125 radioactive seed, the calcifications with associated X shaped biopsy marking clip was localized using a lateral to medial approach. The follow-up mammogram images confirm the seed in the expected location and were marked for Dr. Barry Dienes. Follow-up survey of the patient confirms presence of the radioactive seed. Order number of I-125 seed:  [id number]. Total activity:  5.621 millicuries  Reference Date: 03/07/2016 The patient tolerated the procedure well and was released from the [facility]. She was given instructions regarding seed removal. IMPRESSION: Radioactive seed localization left breast. The radioactive seed is placed in the center of the linear oriented calcifications in the upper-outer left breast which span a distance of approximately 2.5 cm. Electronically Signed   By: Everlean Alstrom M.D.   On: 03/16/2016 13:35      IMPRESSION/PLAN:  Left breast DCIS with microinvasion, ER / PR negative  It was a pleasure meeting the patient today. We discussed the risks, benefits, and side effects of radiotherapy. I recommend radiotherapy to the left breast with high tangents to reduce her risk of  locoregional recurrence by 2/3. She did not have any sentinel nodes removed, and the high tangents will treat the sentinel nodes in the unlikely case she has lymph node involvement. We discussed that radiation would take approximately 6-7 weeks to complete and that I would give the patient a few weeks to heal following surgery before starting treatment planning. We spoke about acute effects including skin irritation and fatigue as well as much less common late effects including internal organ injury or irritation. We spoke about the latest technology that is used to minimize the risk of late effects for patients undergoing radiotherapy to the breast or chest wall. No guarantees of treatment were given. The patient is enthusiastic about proceeding with treatment. I look forward to participating in the patient's care. Consent signed today.  CT scan and planning scheduled  for 9:00 am on Wednesday 04/11/16.   __________________________________________   Eppie Gibson, MD   This document serves as a record of services personally performed by Eppie Gibson, MD. It was created on her behalf by Bethann Humble, a trained medical scribe. The creation of this record is based on the scribe's personal observations and the provider's statements to them. This document has been checked and approved by the attending provider.

## 2016-04-05 ENCOUNTER — Telehealth: Payer: Self-pay | Admitting: Hematology

## 2016-04-05 ENCOUNTER — Ambulatory Visit (HOSPITAL_BASED_OUTPATIENT_CLINIC_OR_DEPARTMENT_OTHER): Payer: 59 | Admitting: Hematology

## 2016-04-05 VITALS — BP 116/80 | HR 90 | Temp 97.9°F | Resp 20 | Ht 65.0 in | Wt 267.9 lb

## 2016-04-05 DIAGNOSIS — D0512 Intraductal carcinoma in situ of left breast: Secondary | ICD-10-CM | POA: Diagnosis not present

## 2016-04-05 DIAGNOSIS — E669 Obesity, unspecified: Secondary | ICD-10-CM

## 2016-04-05 DIAGNOSIS — C50412 Malignant neoplasm of upper-outer quadrant of left female breast: Secondary | ICD-10-CM

## 2016-04-05 DIAGNOSIS — I10 Essential (primary) hypertension: Secondary | ICD-10-CM

## 2016-04-05 DIAGNOSIS — Z171 Estrogen receptor negative status [ER-]: Secondary | ICD-10-CM | POA: Diagnosis not present

## 2016-04-05 NOTE — Progress Notes (Signed)
Kim Roberts  [phone]) [phone] Fax:(336) [phone]  Clinic follow Up Note   Patient Care Team: Tonia Ghent, MD as PCP - General (Family Medicine) 04/05/2016   CHIEF COMPLAINT:  Follow up left breast cancer   Oncology History   Breast cancer of upper-outer quadrant of left female breast [facility])   Staging form: Breast, AJCC 7th Edition   - Clinical stage from 02/10/2016: Stage 0 (Tis (DCIS), N0, M0) - Signed by Truitt Merle, MD on 03/06/2016   - Pathologic: Stage IA (T22mc, N0, cM0) - Signed by SEppie Gibson MD on 04/04/2016      Breast cancer of upper-outer quadrant of left female breast ([facility]   02/02/2016 Mammogram    Diagnostic mammogram showed 2 adjacent group of calcifications in the upper outer quadrant of left breast, suspicious for malignancy.      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 There is a small focus suspicious for early invasion.       02/10/2016 Receptors her2    ER negative, PR negative      03/19/2016 Surgery    Left breast lumpectomy      03/19/2016 Pathology Results    Left breast lumpectomy showed high-grade DCIS, 2.6 cm, with focal microinvasion (<0.1cm), margins were negative      03/19/2016 Receptors her2    ER negative, PR negative, HER-2 positive, with ratio 2.76 and copy # 15.3        HISTORY OF PRESENTING ILLNESS (03/06/2016):  Kim Coil55y.o. female is here because of Her recently diagnosed left breast DCIS. She presents to my clinic to herself today.  This was discovered by screening mammogram. She had no palpable mass, skin change or nipple discharge. She denies any new symptoms. Her mammogram on 02/02/2016 showed 2 adjacent group of calcifications in the upper outer quadrant of left breast, suspicious for malignancy. She underwent core needle biopsy of the left breast lesion,  which showed high-grade DCIS. ER and PR negative.  Patient had no prior breast biopsy or abnormal mammogram. 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 She is single, lives alone, had no pregnancy. Family history was negative for breast cancer, but her mother and brother had colon cancer. She is post menopause.  GYN HISTORY  Menarchal: 16 LMP: 55  Contraceptive: 27 years HRT: no  G0P0:  CURRENT THERAPY: pending   INTERIM HISTORY: [name] for follow-up. She underwent left breast lumpectomy on 03/19/2016, tolerated surgery very well. She has recovered well, has minimal pain at the incision site, no other complaints.  MEDICAL HISTORY:  Past Medical History:  Diagnosis Date  . Colon polyp   . Heart murmur    as a child  . Hyperlipidemia   . Hypertension   . Menopause   . 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     SURGICAL HISTORY: Past Surgical History:  Procedure Laterality Date  . BREAST LUMPECTOMY Left 03/19/2016   Procedure: SEED LOCALIZED LEFT BREAST LUMPECTOMY;  Surgeon: FStark Klein MD;  Location: [facility]  Service: General;  Laterality: Left;  . COLONOSCOPY    . MASS EXCISION  08/30/2011   Procedure: EXCISION MASS;  Surgeon: FStark Klein MD;  Location: [facility];  Service: General;  Laterality: Right;  Excision of Right Axillary  Mass   . POLYPECTOMY      SOCIAL HISTORY: Social History   Social History  . Marital status: Single    Spouse name: N/A  . Number of children: N/A  . Years of education: N/A   Occupational History  . Not on file.   Social History Main Topics  . Smoking status: Never Smoker  . Smokeless tobacco: Never Used  . Alcohol use 0.0 oz/week     Comment: occassionally  . Drug use: No  . Sexual activity: Yes    Birth control/ protection: Post-menopausal   Other Topics Concern  . Not on file   Social History Narrative   Single, lab tech at [employer]    FAMILY HISTORY: Family  History  Problem Relation Age of Onset  . Cancer Mother 53    colon cancer  . Colon cancer Mother 65  . Cancer Father 53    lung cancer  . Seizures Brother   . Colon cancer Brother 53  . Diabetes Paternal Uncle   . Asthma Other   . Esophageal cancer Neg Hx   . Stomach cancer Neg Hx   . Breast cancer Neg Hx     ALLERGIES:  is allergic to latex.  MEDICATIONS:  Current Outpatient Prescriptions  Medication Sig Dispense Refill  . hydrochlorothiazide (HYDRODIURIL) 25 MG tablet Take 1 tablet (25 mg total) by mouth daily. 30 tablet 11  . Multiple Vitamin (MULTIVITAMIN) tablet Take 1 tablet by mouth daily.    Marland Kitchen oxyCODONE (OXY IR/ROXICODONE) 5 MG immediate release tablet Take 1-2 tablets (5-10 mg total) by mouth every 6 (six) hours as needed for moderate pain, severe pain or breakthrough pain. (Patient not taking: Reported on 04/04/2016) 20 tablet 0   No current facility-administered medications for this visit.     REVIEW OF SYSTEMS:   Constitutional: Denies fevers, chills or abnormal night sweats Eyes: Denies blurriness of vision, double vision or watery eyes Ears, nose, mouth, throat, and face: Denies mucositis or sore throat Respiratory: Denies cough, dyspnea or wheezes Cardiovascular: Denies palpitation, chest discomfort or lower extremity swelling Gastrointestinal:  Denies nausea, heartburn or change in bowel habits Skin: Denies abnormal skin rashes Lymphatics: Denies new lymphadenopathy or easy bruising Neurological:Denies numbness, tingling or new weaknesses Behavioral/Psych: Mood is stable, no new changes  All other systems were reviewed with the patient and are negative.  PHYSICAL EXAMINATION: ECOG PERFORMANCE STATUS: 0 - Asymptomatic  Vitals:   04/05/16 1039  BP: 116/80  Pulse: 90  Resp: 20  Temp: 97.9 F (36.6 C)   Filed Weights   04/05/16 1039  Weight: 267 lb 14.4 oz (121.5 kg)    GENERAL:alert, no distress and comfortable SKIN: skin color, texture, turgor  are normal, no rashes or significant lesions EYES: normal, conjunctiva are pink and non-injected, sclera clear OROPHARYNX:no exudate, no erythema and lips, buccal mucosa, and tongue normal  NECK: supple, thyroid normal size, non-tender, without nodularity LYMPH:  no palpable lymphadenopathy in the cervical, axillary or inguinal LUNGS: clear to auscultation and percussion with normal breathing effort HEART: regular rate & rhythm and no murmurs and no lower extremity edema ABDOMEN:abdomen soft, non-tender and normal bowel sounds Musculoskeletal:no cyanosis of digits and no clubbing  PSYCH: alert & oriented x 3 with fluent speech NEURO: no focal motor/sensory deficits Breasts: Breast inspection showed them to be symmetrical with no nipple discharge. The surgical incision in the left breast is healing well. Palpation of the breasts and axilla revealed no obvious mass that I could appreciate.  LABORATORY DATA:  I have reviewed the data as listed CBC Latest Ref Rng & Units 12/15/2014 02/14/2013 08/21/2011  WBC 4.0 - 10.5 K/uL 8.2 9.0 7.1  Hemoglobin 12.0 - 15.0 g/dL 13.0 13.0 12.9  Hematocrit 36.0 - 46.0 % 39.3 37.9 38.6  Platelets 150.0 - 400.0 K/uL 322.0 304 313   CMP Latest Ref Rng & Units 03/16/2016 01/30/2016 12/15/2014  Glucose 65 - 99 mg/dL 102(H) 118(H) 105(H)  BUN 6 - 20 mg/dL _0 Creatinine 0.44 - 1.00 mg/dL 0.85 0.98 0.80  Sodium 135 - 145 mmol/L 141 139 142  Potassium 3.5 - 5.1 mmol/L 3.6 3.5 3.7  Chloride 101 - 111 mmol/L 102 99 105  CO2 22 - 32 mmol/L _1 Calcium 8.9 - 10.3 mg/dL 9.4 9.4 9.4  Total Protein 6.0 - 8.3 g/dL - - 7.1  Total Bilirubin 0.2 - 1.2 mg/dL - - 0.5  Alkaline Phos 39 - 117 U/L - - 65  AST 0 - 37 U/L - - 30  ALT 0 - 35 U/L - - 37(H)   PATHOLOGY REPORT  Diagnosis 02/10/2016 Breast, left, needle core biopsy, upper outer quadrant - 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 Microscopic Comment There is a small  focus suspicious for early invasion. Prognostic markers will be ordered. Dr. Lyndon Code has reviewed the case. The case was called to [facility] on 02/14/2016.  Results: IMMUNOHISTOCHEMICAL AND MORPHOMETRIC ANALYSIS PERFORMED MANUALLY Estrogen Receptor: 0%, NEGATIVE Progesterone Receptor: 0%, NEGATIVE  Diagnosis 03/19/2016 Breast, lumpectomy, Left - HIGH GRADE DUCTAL CARCINOMA IN SITU (2.6 CM) WITH FOCAL MICROINVASION (<0.1 CM). - RESECTION MARGINS ARE NEGATIVE FOR CARCINOMA. - BIOPSY SITE. - FIBROCYSTIC CHANGE. - SEE ONCOLOGY TABLE. Microscopic Comment BREAST, INVASIVE TUMOR, WITHOUT LYMPH NODES PRESENT Specimen, including laterality : Left breast lump. Procedure: Left breast lumpectomy. Histologic type: Microinvasive ductal carcinoma. Grade: 3 Tubule formation: 3 Nuclear pleomorphism: 3 Mitotic: 2 Tumor size (glass slide measurement): <0.1 cm Margins: Invasive, distance to closest margin: >0.5 cm. In-situ, distance to closest margin: >0.5 cm. If margin positive, focally or broadly: N/A. Lymphovascular invasion: Not identified. Ductal carcinoma in situ: Present. Grade: III Extensive intraductal component: Present. Lobular neoplasia: Not identified. Tumor focality: Unifocal. Extent of tumor: Confined to breast parenchyma. Breast prognostic profile: Invasive focus is very limited, but markers will be attempted. Non-neoplastic breast: Fibrocystic change, biopsy site. TNM: pT58m, pNX Comments: The majority of the lesion is high grade ductal carcinoma in situ. There is a single small (<0.1 cm) focus of invasion, confirmed to lack basal cell markers (p63, calponin, smooth muscle myosin). 2 of 4 FINAL for Kim Roberts, Kim Roberts(([id number] JVicente MalesMD Pathologist, Electronic Signature (Case signed 03/22/2016) Specimen Gross and Clinical Information PROGNOSTIC INDICATORS Results: IMMUNOHISTOCHEMICAL AND MORPHOMETRIC ANALYSIS PERFORMED MANUALLY Estrogen Receptor:  0%, NEGATIVE Progesterone Receptor: 0%, NEGATIVE Proliferation Marker Ki67: 15% Results: HER2 - **POSITIVE** RATIO OF HER2/CEP17 SIGNALS 2.76 AVERAGE HER2 COPY NUMBER PER CELL 15.30   RADIOGRAPHIC STUDIES: I have personally reviewed the radiological images as listed and agreed with the findings in the report. Mm Breast Surgical Specimen  Result Date: 03/19/2016 CLINICAL DATA:  Ductal carcinoma in situ of the left breast. Radioactive seed localization was performed 03/16/2016. Patient underwent lumpectomy today. EXAM: SPECIMEN RADIOGRAPH OF THE LEFT BREAST COMPARISON:  Previous exam(s). FINDINGS: Status post excision of the left breast. The radioactive seed and biopsy marker clip are present, completely intact, and were marked for pathology. IMPRESSION: Specimen radiograph  of the left breast. Electronically Signed   By: Curlene Dolphin M.D.   On: 03/19/2016 15:31   Mm Lt Radioactive Seed Loc Mammo Guide  Result Date: 03/16/2016 CLINICAL DATA:  55 year old female with recently diagnosed ductal carcinoma in situ post stereotactic guided biopsy of calcifications spanning a distance of approximately 2.5 cm in the upper-outer left breast. EXAM: MAMMOGRAPHIC GUIDED RADIOACTIVE SEED LOCALIZATION OF THE LEFT BREAST COMPARISON:  Previous exam(s). FINDINGS: Patient presents for radioactive seed localization prior to left breast lumpectomy. I met with the patient and we discussed the procedure of seed localization including benefits and alternatives. We discussed the high likelihood of a successful procedure. We discussed the risks of the procedure including infection, bleeding, tissue injury and further surgery. We discussed the low dose of radioactivity involved in the procedure. Informed, written consent was given. The usual time-out protocol was performed immediately prior to the procedure. Using mammographic guidance, sterile technique, 1% lidocaine and an I-125 radioactive seed, the calcifications with  associated X shaped biopsy marking clip was localized using a lateral to medial approach. The follow-up mammogram images confirm the seed in the expected location and were marked for Dr. Barry Dienes. Follow-up survey of the patient confirms presence of the radioactive seed. Order number of I-125 seed:  [id number]. Total activity:  4.098 millicuries  Reference Date: 03/07/2016 The patient tolerated the procedure well and was released from the [facility]. She was given instructions regarding seed removal. IMPRESSION: Radioactive seed localization left breast. The radioactive seed is placed in the center of the linear oriented calcifications in the upper-outer left breast which span a distance of approximately 2.5 cm. Electronically Signed   By: Everlean Alstrom M.D.   On: 03/16/2016 13:35   SCREENING MAMMOGRAM 01/23/2016 FINDINGS: In the left breast, calcifications warrant further evaluation. In the right breast, no findings suspicious for malignancy. Images were processed with CAD.  IMPRESSION: Further evaluation is suggested for calcifications in the left breast.  RECOMMENDATION: Diagnostic mammogram of the left breast. (Code:[id number]-L-53M)  ASSESSMENT & PLAN:   55 year old post menopause woman, with screening discovered left breast DCIS.   1. 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 -She has a high-grade DCIS, with focal microinvasion (<0.1CM), ER/PR negative and HER-2 positive. Her surgical margins were negative. -I discussed with Dr. Barry Dienes, giving her the small focus of microinvasion, we do not feel she needs a second surgery for sentinel lymph node biopsy. But close follow-up would be reasonable, I plan to repeat ultrasound in 6 months to evaluate her axilla adenopathy. -She would not need adjuvant chemotherapy for her focally positive for invasive  carcinoma.  -Given her negative ER and PR in tumor, she would not benefit from adjuvant antiestrogen therapy. -She will certainly benefit from breast radiation, she was seen by a radiation oncologist Dr. Isidore Moos yesterday -We discussed breast cancer surveillance after she completes treatment, Including annual mammogram, breast exam every 6-12 months. -I encouraged her to have healthy diet, exercise regularly, and try to lose some weight after her breast surgery.  2. HTN, obesity  -She will follow-up with her primary care physician  Plan -She will start adjuvant breast radiation soon -I'll see her in 6 months for follow up with mammogram and ultrasound for visit  No orders of the defined types were placed in this encounter.   All questions were answered. The patient knows to call the clinic with  any problems, questions or concerns. I spent 25 minutes counseling the patient face to face. The total time spent in the appointment was 30 minutes and more than 50% was on counseling.     Truitt Merle, MD 04/05/2016

## 2016-04-05 NOTE — Telephone Encounter (Signed)
Appointments scheduled per 10/26 LOS. Patient given AVS report and calendar of future scheduled appointments. °

## 2016-04-08 ENCOUNTER — Encounter: Payer: Self-pay | Admitting: Hematology

## 2016-04-09 NOTE — Telephone Encounter (Signed)
error 

## 2016-04-09 NOTE — Addendum Note (Signed)
Addended by: Truitt Merle on: 04/09/2016 09:06 AM   Modules accepted: Orders

## 2016-04-11 ENCOUNTER — Ambulatory Visit
Admission: RE | Admit: 2016-04-11 | Discharge: 2016-04-11 | Disposition: A | Discharge status: Home / Self Care | Payer: 59 | Source: Ambulatory Visit | Attending: Radiation Oncology | Admitting: Radiation Oncology

## 2016-04-11 ENCOUNTER — Encounter: Payer: Self-pay | Admitting: Radiation Oncology

## 2016-04-11 DIAGNOSIS — Z171 Estrogen receptor negative status [ER-]: Secondary | ICD-10-CM | POA: Diagnosis not present

## 2016-04-11 DIAGNOSIS — Z510 Encounter for antineoplastic radiation therapy: Secondary | ICD-10-CM | POA: Diagnosis not present

## 2016-04-11 DIAGNOSIS — Z86010 Personal history of colonic polyps: Secondary | ICD-10-CM | POA: Diagnosis not present

## 2016-04-11 DIAGNOSIS — I10 Essential (primary) hypertension: Secondary | ICD-10-CM | POA: Diagnosis not present

## 2016-04-11 DIAGNOSIS — R011 Cardiac murmur, unspecified: Secondary | ICD-10-CM | POA: Diagnosis not present

## 2016-04-11 DIAGNOSIS — C50412 Malignant neoplasm of upper-outer quadrant of left female breast: Secondary | ICD-10-CM | POA: Diagnosis not present

## 2016-04-11 DIAGNOSIS — A159 Respiratory tuberculosis unspecified: Secondary | ICD-10-CM | POA: Diagnosis not present

## 2016-04-11 DIAGNOSIS — E785 Hyperlipidemia, unspecified: Secondary | ICD-10-CM | POA: Diagnosis not present

## 2016-04-11 NOTE — Addendum Note (Signed)
Encounter addended by: Eppie Gibson, MD on: 04/11/2016  1:27 PM<BR>    Actions taken: Sign clinical note

## 2016-04-11 NOTE — Progress Notes (Addendum)
Radiation Oncology         [phone] ________________________________  Name: Kim Roberts MRN: [id number]  Date: 04/11/2016  DOB: Oct 28, 1960  SIMULATION AND TREATMENT PLANNING NOTE  /  Special treatment procedure  Outpatient  DIAGNOSIS:     ICD-9-CM ICD-10-CM   1. Malignant neoplasm of upper-outer quadrant of left breast in female, estrogen receptor negative (HCC) 174.4 C50.412    V86.1 Z17.1     NARRATIVE:  The patient was brought to the [facility].  Identity was confirmed.  All relevant records and images related to the planned course of therapy were reviewed.  The patient freely provided informed written consent to proceed with treatment after reviewing the details related to the planned course of therapy. The consent form was witnessed and verified by the simulation staff.    Then, the patient was set-up in a stable reproducible supine position for radiation therapy with her ipsilateral arm over her head, and her upper body secured in a custom-made Vac-lok device.  CT images were obtained.  Surface markings were placed.  The CT images were loaded into the planning software.    Special treatment procedure:  Special treatment procedure was performed today due to the extra time and effort required by myself to plan and prepare this patient for deep inspiration breath hold technique.  I have determined cardiac sparing to be of benefit to this patient to prevent long term cardiac damage due to radiation of the heart.  Bellows were placed on the patient's abdomen. To facilitate cardiac sparing, the patient was coached by the radiation therapists on breath hold techniques and breathing practice was performed. Practice waveforms were obtained. The patient was then scanned while maintaining breath hold in the treatment position.  This image was then transferred over to the imaging specialist. The imaging specialist then created a fusion of the free breathing and breath hold  scans using the chest wall as the stable structure. I personally reviewed the fusion in axial, coronal and sagittal image planes.  Excellent cardiac sparing was obtained.  I felt the patient is an appropriate candidate for breath hold and the patient will be treated as such.  The image fusion was then reviewed with the patient to reinforce the necessity of reproducible breath hold.   TREATMENT PLANNING NOTE: Treatment planning then occurred.  The radiation prescription was entered and confirmed.     A total of 3 medically necessary complex treatment devices were fabricated and supervised by me: 2 fields with MLCs for custom blocks to protect heart, and lungs;  and, a Vac-lok. MORE COMPLEX DEVICES MAY BE MADE IN DOSIMETRY FOR FIELD IN FIELD BEAMS FOR DOSE HOMOGENEITY.  I have requested : 3D Simulation  I have requested a DVH of the following structures: lungs, heart, lumpectomy cavity.    The patient will receive 50.4 Gy in 28 fractions to the left breast with 2 tangential fields.  This will be followed by a boost.  Optical Surface Tracking Plan:  Since intensity modulated radiotherapy (IMRT) and 3D conformal radiation treatment methods are predicated on accurate and precise positioning for treatment, intrafraction motion monitoring is medically necessary to ensure accurate and safe treatment delivery. The ability to quantify intrafraction motion without excessive ionizing radiation dose can only be performed with optical surface tracking. Accordingly, surface imaging offers the opportunity to obtain 3D measurements of patient position throughout IMRT and 3D treatments without excessive radiation exposure. I am ordering optical surface tracking for this patient's upcoming course  of radiotherapy.  ________________________________   Reference:  Particia Jasper, et al. Surface imaging-based analysis of intrafraction motion for breast radiotherapy patients.Journal of [place], n. 6, nov. 2014. ISSN [id number].  Available at: <http://www.jacmp.org/index.php/jacmp/article/view/4957>.    -----------------------------------  Eppie Gibson, MD

## 2016-04-12 ENCOUNTER — Encounter: Payer: Self-pay | Admitting: Radiation Oncology

## 2016-04-12 NOTE — Progress Notes (Signed)
Paperwork (matrix) received given to nurse 04/12/16

## 2016-04-13 ENCOUNTER — Encounter: Payer: 59 | Admitting: Gastroenterology

## 2016-04-16 ENCOUNTER — Encounter: Payer: Self-pay | Admitting: Radiation Oncology

## 2016-04-16 DIAGNOSIS — Z510 Encounter for antineoplastic radiation therapy: Secondary | ICD-10-CM | POA: Diagnosis not present

## 2016-04-16 DIAGNOSIS — C50412 Malignant neoplasm of upper-outer quadrant of left female breast: Secondary | ICD-10-CM | POA: Diagnosis not present

## 2016-04-16 DIAGNOSIS — R011 Cardiac murmur, unspecified: Secondary | ICD-10-CM | POA: Diagnosis not present

## 2016-04-16 DIAGNOSIS — I10 Essential (primary) hypertension: Secondary | ICD-10-CM | POA: Diagnosis not present

## 2016-04-16 DIAGNOSIS — E785 Hyperlipidemia, unspecified: Secondary | ICD-10-CM | POA: Diagnosis not present

## 2016-04-16 DIAGNOSIS — A159 Respiratory tuberculosis unspecified: Secondary | ICD-10-CM | POA: Diagnosis not present

## 2016-04-16 DIAGNOSIS — Z171 Estrogen receptor negative status [ER-]: Secondary | ICD-10-CM | POA: Diagnosis not present

## 2016-04-16 DIAGNOSIS — Z86010 Personal history of colonic polyps: Secondary | ICD-10-CM | POA: Diagnosis not present

## 2016-04-16 NOTE — Progress Notes (Signed)
Paperwork (matrix) received from doctor 11/6, faxed to [phone] conf received 11/6, copy given to patient 11/7

## 2016-04-18 ENCOUNTER — Encounter: Payer: Self-pay | Admitting: Radiation Oncology

## 2016-04-18 ENCOUNTER — Ambulatory Visit
Admission: RE | Admit: 2016-04-18 | Discharge: 2016-04-18 | Disposition: A | Discharge status: Home / Self Care | Payer: 59 | Source: Ambulatory Visit | Attending: Radiation Oncology | Admitting: Radiation Oncology

## 2016-04-18 DIAGNOSIS — R011 Cardiac murmur, unspecified: Secondary | ICD-10-CM | POA: Diagnosis not present

## 2016-04-18 DIAGNOSIS — Z86010 Personal history of colonic polyps: Secondary | ICD-10-CM | POA: Diagnosis not present

## 2016-04-18 DIAGNOSIS — C50412 Malignant neoplasm of upper-outer quadrant of left female breast: Secondary | ICD-10-CM | POA: Diagnosis not present

## 2016-04-18 DIAGNOSIS — E785 Hyperlipidemia, unspecified: Secondary | ICD-10-CM | POA: Diagnosis not present

## 2016-04-18 DIAGNOSIS — A159 Respiratory tuberculosis unspecified: Secondary | ICD-10-CM | POA: Diagnosis not present

## 2016-04-18 DIAGNOSIS — Z510 Encounter for antineoplastic radiation therapy: Secondary | ICD-10-CM | POA: Diagnosis not present

## 2016-04-18 DIAGNOSIS — Z171 Estrogen receptor negative status [ER-]: Secondary | ICD-10-CM | POA: Diagnosis not present

## 2016-04-18 DIAGNOSIS — I10 Essential (primary) hypertension: Secondary | ICD-10-CM | POA: Diagnosis not present

## 2016-04-18 NOTE — Progress Notes (Signed)
Paperwork (matrix) received from doctor 11/8, faxed to [facility] [phone], conf received 11/8, copy given to patient 11/9

## 2016-04-19 ENCOUNTER — Ambulatory Visit
Admission: RE | Admit: 2016-04-19 | Discharge: 2016-04-19 | Disposition: A | Discharge status: Home / Self Care | Payer: 59 | Source: Ambulatory Visit | Attending: Radiation Oncology | Admitting: Radiation Oncology

## 2016-04-19 DIAGNOSIS — A159 Respiratory tuberculosis unspecified: Secondary | ICD-10-CM | POA: Diagnosis not present

## 2016-04-19 DIAGNOSIS — C50412 Malignant neoplasm of upper-outer quadrant of left female breast: Secondary | ICD-10-CM | POA: Diagnosis not present

## 2016-04-19 DIAGNOSIS — Z86010 Personal history of colonic polyps: Secondary | ICD-10-CM | POA: Diagnosis not present

## 2016-04-19 DIAGNOSIS — I10 Essential (primary) hypertension: Secondary | ICD-10-CM | POA: Diagnosis not present

## 2016-04-19 DIAGNOSIS — Z510 Encounter for antineoplastic radiation therapy: Secondary | ICD-10-CM | POA: Diagnosis not present

## 2016-04-19 DIAGNOSIS — E785 Hyperlipidemia, unspecified: Secondary | ICD-10-CM | POA: Diagnosis not present

## 2016-04-19 DIAGNOSIS — R011 Cardiac murmur, unspecified: Secondary | ICD-10-CM | POA: Diagnosis not present

## 2016-04-19 DIAGNOSIS — Z171 Estrogen receptor negative status [ER-]: Secondary | ICD-10-CM | POA: Diagnosis not present

## 2016-04-19 MED ORDER — RADIAPLEXRX EX GEL
Freq: Once | CUTANEOUS | Status: AC
  Administered 2016-04-19: 14:00:00 via TOPICAL

## 2016-04-19 MED ORDER — ALRA NON-METALLIC DEODORANT (RAD-ONC)
1.0000 "application " | Freq: Once | TOPICAL | Status: AC
  Administered 2016-04-19: 1 via TOPICAL

## 2016-04-19 NOTE — Progress Notes (Signed)
Pt here for patient teaching.  Pt given Radiation and You booklet, skin care instructions, Alra deodorant and Radiaplex gel. Pt reports they have not watched the Radiation Therapy Education video, but were given the link to watch at home.  Reviewed areas of pertinence such as fatigue, skin changes, throat changes, urinary and bladder changes, breast tenderness and breast swelling . Pt able to give teach back of to pat skin, use unscented/gentle soap and drink plenty of water,apply Radiaplex bid, avoid applying anything to skin within 4 hours of treatment, avoid wearing an under wire bra and to use an electric razor if they must shave. Pt verbalizes understanding of information given and will contact nursing with any questions or concerns.     Http://rtanswers.org/treatmentinformation/whattoexpect/index

## 2016-04-20 ENCOUNTER — Ambulatory Visit
Admission: RE | Admit: 2016-04-20 | Discharge: 2016-04-20 | Disposition: A | Discharge status: Home / Self Care | Payer: 59 | Source: Ambulatory Visit | Attending: Radiation Oncology | Admitting: Radiation Oncology

## 2016-04-20 DIAGNOSIS — A159 Respiratory tuberculosis unspecified: Secondary | ICD-10-CM | POA: Diagnosis not present

## 2016-04-20 DIAGNOSIS — I10 Essential (primary) hypertension: Secondary | ICD-10-CM | POA: Diagnosis not present

## 2016-04-20 DIAGNOSIS — Z510 Encounter for antineoplastic radiation therapy: Secondary | ICD-10-CM | POA: Diagnosis not present

## 2016-04-20 DIAGNOSIS — Z171 Estrogen receptor negative status [ER-]: Secondary | ICD-10-CM | POA: Diagnosis not present

## 2016-04-20 DIAGNOSIS — C50412 Malignant neoplasm of upper-outer quadrant of left female breast: Secondary | ICD-10-CM | POA: Diagnosis not present

## 2016-04-20 DIAGNOSIS — R011 Cardiac murmur, unspecified: Secondary | ICD-10-CM | POA: Diagnosis not present

## 2016-04-20 DIAGNOSIS — E785 Hyperlipidemia, unspecified: Secondary | ICD-10-CM | POA: Diagnosis not present

## 2016-04-20 DIAGNOSIS — Z86010 Personal history of colonic polyps: Secondary | ICD-10-CM | POA: Diagnosis not present

## 2016-04-23 ENCOUNTER — Ambulatory Visit
Admission: RE | Admit: 2016-04-23 | Discharge: 2016-04-23 | Disposition: A | Discharge status: Home / Self Care | Payer: 59 | Source: Ambulatory Visit | Attending: Radiation Oncology | Admitting: Radiation Oncology

## 2016-04-23 ENCOUNTER — Encounter: Payer: Self-pay | Admitting: Radiation Oncology

## 2016-04-23 VITALS — BP 129/78 | HR 83 | Temp 98.4°F | Ht 65.0 in | Wt 267.4 lb

## 2016-04-23 DIAGNOSIS — Z86010 Personal history of colonic polyps: Secondary | ICD-10-CM | POA: Diagnosis not present

## 2016-04-23 DIAGNOSIS — I10 Essential (primary) hypertension: Secondary | ICD-10-CM | POA: Diagnosis not present

## 2016-04-23 DIAGNOSIS — C50412 Malignant neoplasm of upper-outer quadrant of left female breast: Secondary | ICD-10-CM

## 2016-04-23 DIAGNOSIS — R011 Cardiac murmur, unspecified: Secondary | ICD-10-CM | POA: Diagnosis not present

## 2016-04-23 DIAGNOSIS — Z510 Encounter for antineoplastic radiation therapy: Secondary | ICD-10-CM | POA: Diagnosis not present

## 2016-04-23 DIAGNOSIS — Z171 Estrogen receptor negative status [ER-]: Secondary | ICD-10-CM | POA: Diagnosis not present

## 2016-04-23 DIAGNOSIS — A159 Respiratory tuberculosis unspecified: Secondary | ICD-10-CM | POA: Diagnosis not present

## 2016-04-23 DIAGNOSIS — E785 Hyperlipidemia, unspecified: Secondary | ICD-10-CM | POA: Diagnosis not present

## 2016-04-23 NOTE — Progress Notes (Signed)
   Weekly Management Note:  Outpatient    ICD-9-CM ICD-10-CM   1. Malignant neoplasm of upper-outer quadrant of left breast in female, estrogen receptor negative (HCC) 174.4 C50.412    V86.1 Z17.1     Current Dose:  5.4 Gy  Projected Dose: 50.4 Gy  Initial to left breast  Narrative:  The patient presents for routine under treatment assessment.  CBCT/MVCT images/Port film x-rays were reviewed.  The chart was checked. Slight nausea and dizziness immediately after RT  Physical Findings:  height is 5\' 5"  (1.651 m) and weight is 267 lb 6.4 oz (121.3 kg). Her temperature is 98.4 F (36.9 C). Her blood pressure is 129/78 and her pulse is 83. Her oxygen saturation is 99%.   Wt Readings from Last 3 Encounters:  04/23/16 267 lb 6.4 oz (121.3 kg)  04/05/16 267 lb 14.4 oz (121.5 kg)  04/04/16 269 lb 3.2 oz (122.1 kg)   Left breast - no skin changes  Impression:  The patient is tolerating radiotherapy.  Plan:  Continue radiotherapy as planned. Patient instructed to apply Radiaplex in treatment fields. We will keep an eye on her symptoms which may be unrelated to RT.   ________________________________   Eppie Gibson, M.D.

## 2016-04-23 NOTE — Progress Notes (Signed)
Kim Roberts is here for her 3rd fraction of radiation to her Left Breast. She denies pain or fatigue. She denies skin changes to her radiation site. She does report some nausea after her radiation treatments, for about an hour but was gone by afternoon. She is using Radiaplex twice daily as directed.   BP 129/78   Pulse 83   Temp 98.4 F (36.9 C)   Ht 5\' 5"  (1.651 m)   Wt 267 lb 6.4 oz (121.3 kg)   SpO2 99% Comment: room air  BMI 44.50 kg/m    Wt Readings from Last 3 Encounters:  04/23/16 267 lb 6.4 oz (121.3 kg)  04/05/16 267 lb 14.4 oz (121.5 kg)  04/04/16 269 lb 3.2 oz (122.1 kg)

## 2016-04-24 ENCOUNTER — Ambulatory Visit
Admission: RE | Admit: 2016-04-24 | Discharge: 2016-04-24 | Disposition: A | Discharge status: Home / Self Care | Payer: 59 | Source: Ambulatory Visit | Attending: Radiation Oncology | Admitting: Radiation Oncology

## 2016-04-24 DIAGNOSIS — Z171 Estrogen receptor negative status [ER-]: Secondary | ICD-10-CM | POA: Diagnosis not present

## 2016-04-24 DIAGNOSIS — Z510 Encounter for antineoplastic radiation therapy: Secondary | ICD-10-CM | POA: Diagnosis not present

## 2016-04-24 DIAGNOSIS — Z86010 Personal history of colonic polyps: Secondary | ICD-10-CM | POA: Diagnosis not present

## 2016-04-24 DIAGNOSIS — I10 Essential (primary) hypertension: Secondary | ICD-10-CM | POA: Diagnosis not present

## 2016-04-24 DIAGNOSIS — A159 Respiratory tuberculosis unspecified: Secondary | ICD-10-CM | POA: Diagnosis not present

## 2016-04-24 DIAGNOSIS — R011 Cardiac murmur, unspecified: Secondary | ICD-10-CM | POA: Diagnosis not present

## 2016-04-24 DIAGNOSIS — C50412 Malignant neoplasm of upper-outer quadrant of left female breast: Secondary | ICD-10-CM | POA: Diagnosis not present

## 2016-04-24 DIAGNOSIS — E785 Hyperlipidemia, unspecified: Secondary | ICD-10-CM | POA: Diagnosis not present

## 2016-04-25 ENCOUNTER — Ambulatory Visit
Admission: RE | Admit: 2016-04-25 | Discharge: 2016-04-25 | Disposition: A | Discharge status: Home / Self Care | Payer: 59 | Source: Ambulatory Visit | Attending: Radiation Oncology | Admitting: Radiation Oncology

## 2016-04-25 DIAGNOSIS — A159 Respiratory tuberculosis unspecified: Secondary | ICD-10-CM | POA: Diagnosis not present

## 2016-04-25 DIAGNOSIS — R011 Cardiac murmur, unspecified: Secondary | ICD-10-CM | POA: Diagnosis not present

## 2016-04-25 DIAGNOSIS — E785 Hyperlipidemia, unspecified: Secondary | ICD-10-CM | POA: Diagnosis not present

## 2016-04-25 DIAGNOSIS — Z86010 Personal history of colonic polyps: Secondary | ICD-10-CM | POA: Diagnosis not present

## 2016-04-25 DIAGNOSIS — I10 Essential (primary) hypertension: Secondary | ICD-10-CM | POA: Diagnosis not present

## 2016-04-25 DIAGNOSIS — C50412 Malignant neoplasm of upper-outer quadrant of left female breast: Secondary | ICD-10-CM | POA: Diagnosis not present

## 2016-04-25 DIAGNOSIS — Z510 Encounter for antineoplastic radiation therapy: Secondary | ICD-10-CM | POA: Diagnosis not present

## 2016-04-25 DIAGNOSIS — Z171 Estrogen receptor negative status [ER-]: Secondary | ICD-10-CM | POA: Diagnosis not present

## 2016-04-26 ENCOUNTER — Telehealth: Payer: Self-pay | Admitting: *Deleted

## 2016-04-26 ENCOUNTER — Ambulatory Visit
Admission: RE | Admit: 2016-04-26 | Discharge: 2016-04-26 | Disposition: A | Discharge status: Home / Self Care | Payer: 59 | Source: Ambulatory Visit | Attending: Radiation Oncology | Admitting: Radiation Oncology

## 2016-04-26 DIAGNOSIS — E785 Hyperlipidemia, unspecified: Secondary | ICD-10-CM | POA: Diagnosis not present

## 2016-04-26 DIAGNOSIS — Z171 Estrogen receptor negative status [ER-]: Secondary | ICD-10-CM | POA: Diagnosis not present

## 2016-04-26 DIAGNOSIS — C50412 Malignant neoplasm of upper-outer quadrant of left female breast: Secondary | ICD-10-CM | POA: Diagnosis not present

## 2016-04-26 DIAGNOSIS — Z510 Encounter for antineoplastic radiation therapy: Secondary | ICD-10-CM | POA: Diagnosis not present

## 2016-04-26 DIAGNOSIS — I10 Essential (primary) hypertension: Secondary | ICD-10-CM | POA: Diagnosis not present

## 2016-04-26 DIAGNOSIS — Z86010 Personal history of colonic polyps: Secondary | ICD-10-CM | POA: Diagnosis not present

## 2016-04-26 DIAGNOSIS — R011 Cardiac murmur, unspecified: Secondary | ICD-10-CM | POA: Diagnosis not present

## 2016-04-26 DIAGNOSIS — A159 Respiratory tuberculosis unspecified: Secondary | ICD-10-CM | POA: Diagnosis not present

## 2016-04-26 NOTE — Telephone Encounter (Signed)
  Oncology Nurse Navigator Documentation  Navigator Location: [facility] (04/26/16 1400)   )Navigator Encounter Type: Telephone (04/26/16 1400) Telephone: Outgoing Call (04/26/16 1400)     Surgery Date: 03/19/16 (04/26/16 1400)           Treatment Initiated Date: 03/19/16 (04/26/16 1400) Patient Visit Type: RadOnc (04/26/16 1400) Treatment Phase: First Radiation Tx (04/26/16 1400) Barriers/Navigation Needs: No barriers at this time;No Questions;No Needs (04/26/16 1400)   Interventions: None required (04/26/16 1400)  Relate doing well and without complaints during xrt.          Acuity: Level 1 (04/26/16 1400)         Time Spent with Patient: 15 (04/26/16 1400)

## 2016-04-27 ENCOUNTER — Ambulatory Visit
Admission: RE | Admit: 2016-04-27 | Discharge: 2016-04-27 | Disposition: A | Discharge status: Home / Self Care | Payer: 59 | Source: Ambulatory Visit | Attending: Radiation Oncology | Admitting: Radiation Oncology

## 2016-04-27 DIAGNOSIS — I10 Essential (primary) hypertension: Secondary | ICD-10-CM | POA: Diagnosis not present

## 2016-04-27 DIAGNOSIS — R011 Cardiac murmur, unspecified: Secondary | ICD-10-CM | POA: Diagnosis not present

## 2016-04-27 DIAGNOSIS — C50412 Malignant neoplasm of upper-outer quadrant of left female breast: Secondary | ICD-10-CM | POA: Diagnosis not present

## 2016-04-27 DIAGNOSIS — Z171 Estrogen receptor negative status [ER-]: Secondary | ICD-10-CM | POA: Diagnosis not present

## 2016-04-27 DIAGNOSIS — Z510 Encounter for antineoplastic radiation therapy: Secondary | ICD-10-CM | POA: Diagnosis not present

## 2016-04-27 DIAGNOSIS — E785 Hyperlipidemia, unspecified: Secondary | ICD-10-CM | POA: Diagnosis not present

## 2016-04-27 DIAGNOSIS — A159 Respiratory tuberculosis unspecified: Secondary | ICD-10-CM | POA: Diagnosis not present

## 2016-04-27 DIAGNOSIS — Z86010 Personal history of colonic polyps: Secondary | ICD-10-CM | POA: Diagnosis not present

## 2016-04-29 ENCOUNTER — Ambulatory Visit: Payer: 59

## 2016-04-30 ENCOUNTER — Ambulatory Visit
Admission: RE | Admit: 2016-04-30 | Discharge: 2016-04-30 | Disposition: A | Discharge status: Home / Self Care | Payer: 59 | Source: Ambulatory Visit | Attending: Radiation Oncology | Admitting: Radiation Oncology

## 2016-04-30 ENCOUNTER — Encounter: Payer: Self-pay | Admitting: Radiation Oncology

## 2016-04-30 VITALS — BP 123/83 | HR 93 | Temp 98.4°F | Resp 18 | Ht 65.0 in | Wt 266.3 lb

## 2016-04-30 DIAGNOSIS — I10 Essential (primary) hypertension: Secondary | ICD-10-CM | POA: Diagnosis not present

## 2016-04-30 DIAGNOSIS — C50412 Malignant neoplasm of upper-outer quadrant of left female breast: Secondary | ICD-10-CM | POA: Diagnosis not present

## 2016-04-30 DIAGNOSIS — Z171 Estrogen receptor negative status [ER-]: Principal | ICD-10-CM

## 2016-04-30 DIAGNOSIS — Z510 Encounter for antineoplastic radiation therapy: Secondary | ICD-10-CM | POA: Diagnosis not present

## 2016-04-30 DIAGNOSIS — E785 Hyperlipidemia, unspecified: Secondary | ICD-10-CM | POA: Diagnosis not present

## 2016-04-30 DIAGNOSIS — R011 Cardiac murmur, unspecified: Secondary | ICD-10-CM | POA: Diagnosis not present

## 2016-04-30 DIAGNOSIS — Z86010 Personal history of colonic polyps: Secondary | ICD-10-CM | POA: Diagnosis not present

## 2016-04-30 DIAGNOSIS — A159 Respiratory tuberculosis unspecified: Secondary | ICD-10-CM | POA: Diagnosis not present

## 2016-04-30 NOTE — Progress Notes (Signed)
Kim Roberts is here for her 8 th fraction of radiation to her Left Breast. She denies pain .  Reports fatigue.  Had pain over the weekend,  1/10 to left armpit.  Skin changes to her radiation site armpit with hyperpigmentation,tender with burning, did not see broken skin.. She does report some nausea after her radiation treatments none this past week, reported dizziness after the radiation two hours later. Encouraged to drink more liquids, stated she do not always drink enough.  She is using Radiaplex twice daily as directed.  Wt Readings from Last 3 Encounters:  04/30/16 266 lb 4.8 oz (120.8 kg)  04/23/16 267 lb 6.4 oz (121.3 kg)  04/05/16 267 lb 14.4 oz (121.5 kg)  BP 123/83   Pulse 93   Temp 98.4 F (36.9 C) (Oral)   Resp 18   Ht 5\' 5"  (1.651 m)   Wt 266 lb 4.8 oz (120.8 kg)   SpO2 99%   BMI 44.31 kg/m

## 2016-04-30 NOTE — Progress Notes (Signed)
   Weekly Management Note:  Outpatient    ICD-9-CM ICD-10-CM   1. Malignant neoplasm of upper-outer quadrant of left breast in female, estrogen receptor negative (HCC) 174.4 C50.412    V86.1 Z17.1     Current Dose:14.4 Gy  Projected Dose: 50.4 Gy  Initial to left breast  Narrative:  The patient presents for routine under treatment assessment.  CBCT/MVCT images/Port film x-rays were reviewed.  The chart was checked. Slight nausea and dizziness immediately after RT are improving - in fact, nausea is gone know.  She thinks she could drink more fluids.  Physical Findings:  height is 5\' 5"  (1.651 m) and weight is 266 lb 4.8 oz (120.8 kg). Her oral temperature is 98.4 F (36.9 C). Her blood pressure is 123/83 and her pulse is 93. Her respiration is 18 and oxygen saturation is 99%.   Wt Readings from Last 3 Encounters:  04/30/16 266 lb 4.8 oz (120.8 kg)  04/23/16 267 lb 6.4 oz (121.3 kg)  04/05/16 267 lb 14.4 oz (121.5 kg)   Left breast - mild hyperpigmentation  Impression:  The patient is tolerating radiotherapy.  Plan:  Continue radiotherapy as planned. Patient instructed to apply Radiaplex in treatment fields.     ________________________________   Eppie Gibson, M.D.

## 2016-05-01 ENCOUNTER — Ambulatory Visit
Admission: RE | Admit: 2016-05-01 | Discharge: 2016-05-01 | Disposition: A | Discharge status: Home / Self Care | Payer: 59 | Source: Ambulatory Visit | Attending: Radiation Oncology | Admitting: Radiation Oncology

## 2016-05-01 DIAGNOSIS — A159 Respiratory tuberculosis unspecified: Secondary | ICD-10-CM | POA: Diagnosis not present

## 2016-05-01 DIAGNOSIS — I10 Essential (primary) hypertension: Secondary | ICD-10-CM | POA: Diagnosis not present

## 2016-05-01 DIAGNOSIS — R011 Cardiac murmur, unspecified: Secondary | ICD-10-CM | POA: Diagnosis not present

## 2016-05-01 DIAGNOSIS — E785 Hyperlipidemia, unspecified: Secondary | ICD-10-CM | POA: Diagnosis not present

## 2016-05-01 DIAGNOSIS — C50412 Malignant neoplasm of upper-outer quadrant of left female breast: Secondary | ICD-10-CM | POA: Diagnosis not present

## 2016-05-01 DIAGNOSIS — Z510 Encounter for antineoplastic radiation therapy: Secondary | ICD-10-CM | POA: Diagnosis not present

## 2016-05-01 DIAGNOSIS — Z171 Estrogen receptor negative status [ER-]: Secondary | ICD-10-CM | POA: Diagnosis not present

## 2016-05-01 DIAGNOSIS — Z86010 Personal history of colonic polyps: Secondary | ICD-10-CM | POA: Diagnosis not present

## 2016-05-02 ENCOUNTER — Ambulatory Visit
Admission: RE | Admit: 2016-05-02 | Discharge: 2016-05-02 | Disposition: A | Discharge status: Home / Self Care | Payer: 59 | Source: Ambulatory Visit | Attending: Radiation Oncology | Admitting: Radiation Oncology

## 2016-05-02 DIAGNOSIS — Z86010 Personal history of colonic polyps: Secondary | ICD-10-CM | POA: Diagnosis not present

## 2016-05-02 DIAGNOSIS — R011 Cardiac murmur, unspecified: Secondary | ICD-10-CM | POA: Diagnosis not present

## 2016-05-02 DIAGNOSIS — A159 Respiratory tuberculosis unspecified: Secondary | ICD-10-CM | POA: Diagnosis not present

## 2016-05-02 DIAGNOSIS — C50412 Malignant neoplasm of upper-outer quadrant of left female breast: Secondary | ICD-10-CM | POA: Diagnosis not present

## 2016-05-02 DIAGNOSIS — Z171 Estrogen receptor negative status [ER-]: Secondary | ICD-10-CM | POA: Diagnosis not present

## 2016-05-02 DIAGNOSIS — I10 Essential (primary) hypertension: Secondary | ICD-10-CM | POA: Diagnosis not present

## 2016-05-02 DIAGNOSIS — E785 Hyperlipidemia, unspecified: Secondary | ICD-10-CM | POA: Diagnosis not present

## 2016-05-02 DIAGNOSIS — Z510 Encounter for antineoplastic radiation therapy: Secondary | ICD-10-CM | POA: Diagnosis not present

## 2016-05-04 ENCOUNTER — Ambulatory Visit: Payer: 59

## 2016-05-07 ENCOUNTER — Encounter: Payer: Self-pay | Admitting: Radiation Oncology

## 2016-05-07 ENCOUNTER — Ambulatory Visit
Admission: RE | Admit: 2016-05-07 | Discharge: 2016-05-07 | Disposition: A | Discharge status: Home / Self Care | Payer: 59 | Source: Ambulatory Visit | Attending: Radiation Oncology | Admitting: Radiation Oncology

## 2016-05-07 VITALS — BP 126/77 | HR 84 | Temp 98.0°F | Resp 18 | Ht 65.0 in | Wt 269.6 lb

## 2016-05-07 DIAGNOSIS — R011 Cardiac murmur, unspecified: Secondary | ICD-10-CM | POA: Diagnosis not present

## 2016-05-07 DIAGNOSIS — Z510 Encounter for antineoplastic radiation therapy: Secondary | ICD-10-CM | POA: Diagnosis not present

## 2016-05-07 DIAGNOSIS — Z171 Estrogen receptor negative status [ER-]: Secondary | ICD-10-CM | POA: Diagnosis not present

## 2016-05-07 DIAGNOSIS — I10 Essential (primary) hypertension: Secondary | ICD-10-CM | POA: Diagnosis not present

## 2016-05-07 DIAGNOSIS — C50412 Malignant neoplasm of upper-outer quadrant of left female breast: Secondary | ICD-10-CM

## 2016-05-07 DIAGNOSIS — A159 Respiratory tuberculosis unspecified: Secondary | ICD-10-CM | POA: Diagnosis not present

## 2016-05-07 DIAGNOSIS — Z86010 Personal history of colonic polyps: Secondary | ICD-10-CM | POA: Diagnosis not present

## 2016-05-07 DIAGNOSIS — E785 Hyperlipidemia, unspecified: Secondary | ICD-10-CM | POA: Diagnosis not present

## 2016-05-07 NOTE — Progress Notes (Signed)
   Weekly Management Note:  Outpatient    ICD-9-CM ICD-10-CM   1. Malignant neoplasm of upper-outer quadrant of left breast in female, estrogen receptor negative (HCC) 174.4 C50.412    V86.1 Z17.1     Current Dose:19.8 Gy  Projected Dose: 50.4 Gy  Initial to left breast  Narrative:  The patient presents for routine under treatment assessment.  CBCT/MVCT images/Port film x-rays were reviewed.  The chart was checked. Doing well.  Dizziness /nausea are rare. Gaining weight.  Skin care going well   Physical Findings:  height is 5\' 5"  (1.651 m) and weight is 269 lb 9.6 oz (122.3 kg). Her oral temperature is 98 F (36.7 C). Her blood pressure is 126/77 and her pulse is 84. Her respiration is 18 and oxygen saturation is 100%.   Wt Readings from Last 3 Encounters:  05/07/16 269 lb 9.6 oz (122.3 kg)  04/30/16 266 lb 4.8 oz (120.8 kg)  04/23/16 267 lb 6.4 oz (121.3 kg)   Left breast - mild hyperpigmentation; skin intact.  Impression:  The patient is tolerating radiotherapy.  Plan:  Continue radiotherapy as planned. Patient instructed to apply Radiaplex in treatment fields.     ________________________________   Eppie Gibson, M.D.

## 2016-05-07 NOTE — Progress Notes (Signed)
Kim Roberts is here for her 24 th fraction of radiation to her Left Breast. She denies pain .  Reports fatigue.   Reports left armpit discomfort is much better.  Skin changes to her radiation site armpit with hyperpigmentation,tender with burning, did not see broken skin.. She does report some nausea after her radiation treatments none this past week ongoing less, reported dizziness after the radiation two hours later ongoing. Encouraged to drink more liquids, stated she do not always drink enough.  She is using Radiaplex twice daily as directed.  Wt Readings from Last 3 Encounters:  05/07/16 269 lb 9.6 oz (122.3 kg)  04/30/16 266 lb 4.8 oz (120.8 kg)  04/23/16 267 lb 6.4 oz (121.3 kg)  BP 126/77   Pulse 84   Temp 98 F (36.7 C) (Oral)   Resp 18   Ht 5\' 5"  (1.651 m)   Wt 269 lb 9.6 oz (122.3 kg)   SpO2 100%   BMI 44.86 kg/m

## 2016-05-08 ENCOUNTER — Ambulatory Visit
Admission: RE | Admit: 2016-05-08 | Discharge: 2016-05-08 | Disposition: A | Discharge status: Home / Self Care | Payer: 59 | Source: Ambulatory Visit | Attending: Radiation Oncology | Admitting: Radiation Oncology

## 2016-05-08 DIAGNOSIS — E785 Hyperlipidemia, unspecified: Secondary | ICD-10-CM | POA: Diagnosis not present

## 2016-05-08 DIAGNOSIS — C50412 Malignant neoplasm of upper-outer quadrant of left female breast: Secondary | ICD-10-CM | POA: Diagnosis not present

## 2016-05-08 DIAGNOSIS — Z510 Encounter for antineoplastic radiation therapy: Secondary | ICD-10-CM | POA: Diagnosis not present

## 2016-05-08 DIAGNOSIS — Z171 Estrogen receptor negative status [ER-]: Secondary | ICD-10-CM | POA: Diagnosis not present

## 2016-05-08 DIAGNOSIS — Z86010 Personal history of colonic polyps: Secondary | ICD-10-CM | POA: Diagnosis not present

## 2016-05-08 DIAGNOSIS — A159 Respiratory tuberculosis unspecified: Secondary | ICD-10-CM | POA: Diagnosis not present

## 2016-05-08 DIAGNOSIS — R011 Cardiac murmur, unspecified: Secondary | ICD-10-CM | POA: Diagnosis not present

## 2016-05-08 DIAGNOSIS — I10 Essential (primary) hypertension: Secondary | ICD-10-CM | POA: Diagnosis not present

## 2016-05-09 ENCOUNTER — Ambulatory Visit
Admission: RE | Admit: 2016-05-09 | Discharge: 2016-05-09 | Disposition: A | Discharge status: Home / Self Care | Payer: 59 | Source: Ambulatory Visit | Attending: Radiation Oncology | Admitting: Radiation Oncology

## 2016-05-09 DIAGNOSIS — C50412 Malignant neoplasm of upper-outer quadrant of left female breast: Secondary | ICD-10-CM | POA: Diagnosis not present

## 2016-05-09 DIAGNOSIS — R011 Cardiac murmur, unspecified: Secondary | ICD-10-CM | POA: Diagnosis not present

## 2016-05-09 DIAGNOSIS — E785 Hyperlipidemia, unspecified: Secondary | ICD-10-CM | POA: Diagnosis not present

## 2016-05-09 DIAGNOSIS — A159 Respiratory tuberculosis unspecified: Secondary | ICD-10-CM | POA: Diagnosis not present

## 2016-05-09 DIAGNOSIS — Z171 Estrogen receptor negative status [ER-]: Secondary | ICD-10-CM | POA: Diagnosis not present

## 2016-05-09 DIAGNOSIS — Z510 Encounter for antineoplastic radiation therapy: Secondary | ICD-10-CM | POA: Diagnosis not present

## 2016-05-09 DIAGNOSIS — Z86010 Personal history of colonic polyps: Secondary | ICD-10-CM | POA: Diagnosis not present

## 2016-05-09 DIAGNOSIS — I10 Essential (primary) hypertension: Secondary | ICD-10-CM | POA: Diagnosis not present

## 2016-05-10 ENCOUNTER — Ambulatory Visit
Admission: RE | Admit: 2016-05-10 | Discharge: 2016-05-10 | Disposition: A | Discharge status: Home / Self Care | Payer: 59 | Source: Ambulatory Visit | Attending: Radiation Oncology | Admitting: Radiation Oncology

## 2016-05-10 DIAGNOSIS — I10 Essential (primary) hypertension: Secondary | ICD-10-CM | POA: Diagnosis not present

## 2016-05-10 DIAGNOSIS — C50412 Malignant neoplasm of upper-outer quadrant of left female breast: Secondary | ICD-10-CM | POA: Diagnosis not present

## 2016-05-10 DIAGNOSIS — Z510 Encounter for antineoplastic radiation therapy: Secondary | ICD-10-CM | POA: Diagnosis not present

## 2016-05-10 DIAGNOSIS — E785 Hyperlipidemia, unspecified: Secondary | ICD-10-CM | POA: Diagnosis not present

## 2016-05-10 DIAGNOSIS — R011 Cardiac murmur, unspecified: Secondary | ICD-10-CM | POA: Diagnosis not present

## 2016-05-10 DIAGNOSIS — A159 Respiratory tuberculosis unspecified: Secondary | ICD-10-CM | POA: Diagnosis not present

## 2016-05-10 DIAGNOSIS — Z171 Estrogen receptor negative status [ER-]: Secondary | ICD-10-CM | POA: Diagnosis not present

## 2016-05-10 DIAGNOSIS — Z86010 Personal history of colonic polyps: Secondary | ICD-10-CM | POA: Diagnosis not present

## 2016-05-11 ENCOUNTER — Ambulatory Visit
Admission: RE | Admit: 2016-05-11 | Discharge: 2016-05-11 | Disposition: A | Discharge status: Home / Self Care | Payer: 59 | Source: Ambulatory Visit | Attending: Radiation Oncology | Admitting: Radiation Oncology

## 2016-05-11 DIAGNOSIS — C50412 Malignant neoplasm of upper-outer quadrant of left female breast: Secondary | ICD-10-CM | POA: Diagnosis not present

## 2016-05-11 DIAGNOSIS — Z86010 Personal history of colonic polyps: Secondary | ICD-10-CM | POA: Diagnosis not present

## 2016-05-11 DIAGNOSIS — I10 Essential (primary) hypertension: Secondary | ICD-10-CM | POA: Diagnosis not present

## 2016-05-11 DIAGNOSIS — E785 Hyperlipidemia, unspecified: Secondary | ICD-10-CM | POA: Diagnosis not present

## 2016-05-11 DIAGNOSIS — A159 Respiratory tuberculosis unspecified: Secondary | ICD-10-CM | POA: Diagnosis not present

## 2016-05-11 DIAGNOSIS — Z171 Estrogen receptor negative status [ER-]: Secondary | ICD-10-CM | POA: Diagnosis not present

## 2016-05-11 DIAGNOSIS — R011 Cardiac murmur, unspecified: Secondary | ICD-10-CM | POA: Diagnosis not present

## 2016-05-11 DIAGNOSIS — Z510 Encounter for antineoplastic radiation therapy: Secondary | ICD-10-CM | POA: Diagnosis not present

## 2016-05-14 ENCOUNTER — Encounter: Payer: Self-pay | Admitting: Radiation Oncology

## 2016-05-14 ENCOUNTER — Ambulatory Visit
Admission: RE | Admit: 2016-05-14 | Discharge: 2016-05-14 | Disposition: A | Discharge status: Home / Self Care | Payer: 59 | Source: Ambulatory Visit | Attending: Radiation Oncology | Admitting: Radiation Oncology

## 2016-05-14 VITALS — BP 125/84 | HR 84 | Temp 98.2°F | Ht 65.0 in | Wt 270.2 lb

## 2016-05-14 DIAGNOSIS — R011 Cardiac murmur, unspecified: Secondary | ICD-10-CM | POA: Diagnosis not present

## 2016-05-14 DIAGNOSIS — C50412 Malignant neoplasm of upper-outer quadrant of left female breast: Secondary | ICD-10-CM | POA: Insufficient documentation

## 2016-05-14 DIAGNOSIS — A159 Respiratory tuberculosis unspecified: Secondary | ICD-10-CM | POA: Diagnosis not present

## 2016-05-14 DIAGNOSIS — E785 Hyperlipidemia, unspecified: Secondary | ICD-10-CM | POA: Diagnosis not present

## 2016-05-14 DIAGNOSIS — Z171 Estrogen receptor negative status [ER-]: Secondary | ICD-10-CM | POA: Diagnosis not present

## 2016-05-14 DIAGNOSIS — L814 Other melanin hyperpigmentation: Secondary | ICD-10-CM | POA: Diagnosis not present

## 2016-05-14 DIAGNOSIS — Z86010 Personal history of colonic polyps: Secondary | ICD-10-CM | POA: Diagnosis not present

## 2016-05-14 DIAGNOSIS — I10 Essential (primary) hypertension: Secondary | ICD-10-CM | POA: Diagnosis not present

## 2016-05-14 DIAGNOSIS — Z510 Encounter for antineoplastic radiation therapy: Secondary | ICD-10-CM | POA: Diagnosis not present

## 2016-05-14 NOTE — Progress Notes (Signed)
   Weekly Management Note:  Outpatient    ICD-9-CM ICD-10-CM   1. Malignant neoplasm of upper-outer quadrant of left breast in female, estrogen receptor negative (HCC) 174.4 C50.412    V86.1 Z17.1     Current Dose 28.8 Gy  Projected Dose: 50.4 Gy  Initial to left breast  Narrative:  The patient presents for routine under treatment assessment.  CBCT/MVCT images/Port film x-rays were reviewed.  The chart was checked. Has been putting H2O2 in left axilla for skin irritation.  Physical Findings:  height is 5\' 5"  (1.651 m) and weight is 270 lb 3.2 oz (122.6 kg). Her temperature is 98.2 F (36.8 C). Her blood pressure is 125/84 and her pulse is 84. Her oxygen saturation is 100%.   Wt Readings from Last 3 Encounters:  05/14/16 270 lb 3.2 oz (122.6 kg)  05/07/16 269 lb 9.6 oz (122.3 kg)  04/30/16 266 lb 4.8 oz (120.8 kg)   Left breast -  Hyperpigmentation with dryness in axilla.  Impression:  The patient is tolerating radiotherapy.  Plan:  Continue radiotherapy as planned. Patient instructed to apply Radiaplex in treatment fields.   She may also apply hydrogel pads in axilla, but not hydrogen peroxide, for irritation.   ________________________________   Eppie Gibson, M.D.

## 2016-05-14 NOTE — Progress Notes (Signed)
Ms. Doetsch presents for her 16th fraction of radiation to her Left Breast. She reports irritation to her Left Axilla which she rates and 1/10. She reports fatigue. She will rest in the afternoon to help with her fatigue. She has some hyperpigmentation to her Left Axilla and underneath her Left Breast. She does have some residual baking soda powder to her Left Axilla visible. She also reports she has been using hydrogen peroxide to her Left Axilla for relief of the irritation. She is using the Radiaplex three times daily at the advice of Dr. Isidore Moos. She has several questions regarding her bill.   BP 125/84   Pulse 84   Temp 98.2 F (36.8 C)   Ht 5\' 5"  (1.651 m)   Wt 270 lb 3.2 oz (122.6 kg)   SpO2 100% Comment: room air  BMI 44.96 kg/m    Wt Readings from Last 3 Encounters:  05/14/16 270 lb 3.2 oz (122.6 kg)  05/07/16 269 lb 9.6 oz (122.3 kg)  04/30/16 266 lb 4.8 oz (120.8 kg)

## 2016-05-15 ENCOUNTER — Ambulatory Visit
Admission: RE | Admit: 2016-05-15 | Discharge: 2016-05-15 | Disposition: A | Discharge status: Home / Self Care | Payer: 59 | Source: Ambulatory Visit | Attending: Radiation Oncology | Admitting: Radiation Oncology

## 2016-05-15 ENCOUNTER — Encounter: Payer: Self-pay | Admitting: Radiation Oncology

## 2016-05-15 DIAGNOSIS — Z171 Estrogen receptor negative status [ER-]: Secondary | ICD-10-CM | POA: Diagnosis not present

## 2016-05-15 DIAGNOSIS — I10 Essential (primary) hypertension: Secondary | ICD-10-CM | POA: Diagnosis not present

## 2016-05-15 DIAGNOSIS — Z86010 Personal history of colonic polyps: Secondary | ICD-10-CM | POA: Diagnosis not present

## 2016-05-15 DIAGNOSIS — A159 Respiratory tuberculosis unspecified: Secondary | ICD-10-CM | POA: Diagnosis not present

## 2016-05-15 DIAGNOSIS — Z510 Encounter for antineoplastic radiation therapy: Secondary | ICD-10-CM | POA: Diagnosis not present

## 2016-05-15 DIAGNOSIS — E785 Hyperlipidemia, unspecified: Secondary | ICD-10-CM | POA: Diagnosis not present

## 2016-05-15 DIAGNOSIS — C50412 Malignant neoplasm of upper-outer quadrant of left female breast: Secondary | ICD-10-CM | POA: Diagnosis not present

## 2016-05-15 DIAGNOSIS — R011 Cardiac murmur, unspecified: Secondary | ICD-10-CM | POA: Diagnosis not present

## 2016-05-16 ENCOUNTER — Ambulatory Visit
Admission: RE | Admit: 2016-05-16 | Discharge: 2016-05-16 | Disposition: A | Discharge status: Home / Self Care | Payer: 59 | Source: Ambulatory Visit | Attending: Radiation Oncology | Admitting: Radiation Oncology

## 2016-05-16 DIAGNOSIS — I10 Essential (primary) hypertension: Secondary | ICD-10-CM | POA: Diagnosis not present

## 2016-05-16 DIAGNOSIS — Z86010 Personal history of colonic polyps: Secondary | ICD-10-CM | POA: Diagnosis not present

## 2016-05-16 DIAGNOSIS — C50412 Malignant neoplasm of upper-outer quadrant of left female breast: Secondary | ICD-10-CM | POA: Diagnosis not present

## 2016-05-16 DIAGNOSIS — Z171 Estrogen receptor negative status [ER-]: Secondary | ICD-10-CM | POA: Diagnosis not present

## 2016-05-16 DIAGNOSIS — R011 Cardiac murmur, unspecified: Secondary | ICD-10-CM | POA: Diagnosis not present

## 2016-05-16 DIAGNOSIS — E785 Hyperlipidemia, unspecified: Secondary | ICD-10-CM | POA: Diagnosis not present

## 2016-05-16 DIAGNOSIS — A159 Respiratory tuberculosis unspecified: Secondary | ICD-10-CM | POA: Diagnosis not present

## 2016-05-16 DIAGNOSIS — Z510 Encounter for antineoplastic radiation therapy: Secondary | ICD-10-CM | POA: Diagnosis not present

## 2016-05-17 ENCOUNTER — Encounter: Payer: Self-pay | Admitting: Radiation Oncology

## 2016-05-17 ENCOUNTER — Ambulatory Visit
Admission: RE | Admit: 2016-05-17 | Discharge: 2016-05-17 | Disposition: A | Discharge status: Home / Self Care | Payer: 59 | Source: Ambulatory Visit | Attending: Radiation Oncology | Admitting: Radiation Oncology

## 2016-05-17 DIAGNOSIS — Z86010 Personal history of colonic polyps: Secondary | ICD-10-CM | POA: Diagnosis not present

## 2016-05-17 DIAGNOSIS — A159 Respiratory tuberculosis unspecified: Secondary | ICD-10-CM | POA: Diagnosis not present

## 2016-05-17 DIAGNOSIS — I10 Essential (primary) hypertension: Secondary | ICD-10-CM | POA: Diagnosis not present

## 2016-05-17 DIAGNOSIS — C50412 Malignant neoplasm of upper-outer quadrant of left female breast: Secondary | ICD-10-CM | POA: Diagnosis not present

## 2016-05-17 DIAGNOSIS — Z171 Estrogen receptor negative status [ER-]: Secondary | ICD-10-CM | POA: Diagnosis not present

## 2016-05-17 DIAGNOSIS — R011 Cardiac murmur, unspecified: Secondary | ICD-10-CM | POA: Diagnosis not present

## 2016-05-17 DIAGNOSIS — E785 Hyperlipidemia, unspecified: Secondary | ICD-10-CM | POA: Diagnosis not present

## 2016-05-17 DIAGNOSIS — Z510 Encounter for antineoplastic radiation therapy: Secondary | ICD-10-CM | POA: Diagnosis not present

## 2016-05-17 NOTE — Progress Notes (Signed)
Paperwork Holland Falling) received 05/17/16, given to nurse

## 2016-05-18 ENCOUNTER — Ambulatory Visit
Admission: RE | Admit: 2016-05-18 | Discharge: 2016-05-18 | Disposition: A | Discharge status: Home / Self Care | Payer: 59 | Source: Ambulatory Visit | Attending: Radiation Oncology | Admitting: Radiation Oncology

## 2016-05-18 DIAGNOSIS — E785 Hyperlipidemia, unspecified: Secondary | ICD-10-CM | POA: Diagnosis not present

## 2016-05-18 DIAGNOSIS — Z510 Encounter for antineoplastic radiation therapy: Secondary | ICD-10-CM | POA: Diagnosis not present

## 2016-05-18 DIAGNOSIS — C50412 Malignant neoplasm of upper-outer quadrant of left female breast: Secondary | ICD-10-CM | POA: Diagnosis not present

## 2016-05-18 DIAGNOSIS — I10 Essential (primary) hypertension: Secondary | ICD-10-CM | POA: Diagnosis not present

## 2016-05-18 DIAGNOSIS — A159 Respiratory tuberculosis unspecified: Secondary | ICD-10-CM | POA: Diagnosis not present

## 2016-05-18 DIAGNOSIS — Z171 Estrogen receptor negative status [ER-]: Secondary | ICD-10-CM | POA: Diagnosis not present

## 2016-05-18 DIAGNOSIS — Z86010 Personal history of colonic polyps: Secondary | ICD-10-CM | POA: Diagnosis not present

## 2016-05-18 DIAGNOSIS — R011 Cardiac murmur, unspecified: Secondary | ICD-10-CM | POA: Diagnosis not present

## 2016-05-21 ENCOUNTER — Ambulatory Visit
Admission: RE | Admit: 2016-05-21 | Discharge: 2016-05-21 | Disposition: A | Discharge status: Home / Self Care | Payer: 59 | Source: Ambulatory Visit | Attending: Radiation Oncology | Admitting: Radiation Oncology

## 2016-05-21 ENCOUNTER — Encounter: Payer: Self-pay | Admitting: Radiation Oncology

## 2016-05-21 VITALS — BP 125/64 | HR 103 | Temp 98.5°F | Resp 20 | Ht 65.0 in | Wt 271.4 lb

## 2016-05-21 DIAGNOSIS — C50412 Malignant neoplasm of upper-outer quadrant of left female breast: Secondary | ICD-10-CM

## 2016-05-21 DIAGNOSIS — R011 Cardiac murmur, unspecified: Secondary | ICD-10-CM | POA: Diagnosis not present

## 2016-05-21 DIAGNOSIS — I10 Essential (primary) hypertension: Secondary | ICD-10-CM | POA: Diagnosis not present

## 2016-05-21 DIAGNOSIS — Z171 Estrogen receptor negative status [ER-]: Secondary | ICD-10-CM | POA: Diagnosis not present

## 2016-05-21 DIAGNOSIS — E785 Hyperlipidemia, unspecified: Secondary | ICD-10-CM | POA: Diagnosis not present

## 2016-05-21 DIAGNOSIS — Z510 Encounter for antineoplastic radiation therapy: Secondary | ICD-10-CM | POA: Diagnosis not present

## 2016-05-21 DIAGNOSIS — Z86010 Personal history of colonic polyps: Secondary | ICD-10-CM | POA: Diagnosis not present

## 2016-05-21 DIAGNOSIS — A159 Respiratory tuberculosis unspecified: Secondary | ICD-10-CM | POA: Diagnosis not present

## 2016-05-21 NOTE — Progress Notes (Signed)
   Weekly Management Note:  Outpatient    ICD-9-CM ICD-10-CM   1. Malignant neoplasm of upper-outer quadrant of left breast in female, estrogen receptor negative (HCC) 174.4 C50.412    V86.1 Z17.1     Current Dose 37.8 Gy  Projected Dose: 50.4 Gy  Initial to left breast  Narrative:  The patient presents for routine under treatment assessment.  CBCT/MVCT images/Port film x-rays were reviewed.  The chart was checked. Hydrogel pads help axilla soreness.  She is noting peeling at IM fold   Physical Findings:  height is 5\' 5"  (1.651 m) and weight is 271 lb 6.4 oz (123.1 kg). Her oral temperature is 98.5 F (36.9 C). Her blood pressure is 125/64 and her pulse is 103 (abnormal). Her respiration is 20 and oxygen saturation is 100%.   Wt Readings from Last 3 Encounters:  05/21/16 271 lb 6.4 oz (123.1 kg)  05/14/16 270 lb 3.2 oz (122.6 kg)  05/07/16 269 lb 9.6 oz (122.3 kg)   Left breast -  Hyperpigmentation with dryness in axilla. Early moist peeling at IM fold  Impression:  The patient is tolerating radiotherapy.  Plan:  Continue radiotherapy as planned. Patient instructed to apply Radiaplex in treatment fields but instead use neosporin at IM fold.   She may also apply hydrogel pads in axilla.  Telfa pads given for IM fold.  ________________________________   Eppie Gibson, M.D.

## 2016-05-21 NOTE — Progress Notes (Signed)
Kim Roberts presents for her 21th fraction of radiation to her Left Breast. She reports irritation to her Left Axilla has improved. She reports fatigue. She will rest in the afternoon to help with her fatigue. She has some hyperpigmentation to her Left Axilla and underneath her Left Breast.  Left breast under the inframmay fold is peeling.  Using hydrogel pads under the left axilla. She is using the Radiaplex three times daily at the advice of Dr. Isidore Moos.  Appetite is good. Wt Readings from Last 3 Encounters:  05/21/16 271 lb 6.4 oz (123.1 kg)  05/14/16 270 lb 3.2 oz (122.6 kg)  05/07/16 269 lb 9.6 oz (122.3 kg)  BP 125/64   Pulse (!) 103   Temp 98.5 F (36.9 C) (Oral)   Resp 20   Ht 5\' 5"  (1.651 m)   Wt 271 lb 6.4 oz (123.1 kg)   SpO2 100%   BMI 45.16 kg/m

## 2016-05-22 ENCOUNTER — Ambulatory Visit
Admission: RE | Admit: 2016-05-22 | Discharge: 2016-05-22 | Disposition: A | Discharge status: Home / Self Care | Payer: 59 | Source: Ambulatory Visit | Attending: Radiation Oncology | Admitting: Radiation Oncology

## 2016-05-22 DIAGNOSIS — C50412 Malignant neoplasm of upper-outer quadrant of left female breast: Secondary | ICD-10-CM | POA: Diagnosis not present

## 2016-05-22 DIAGNOSIS — E785 Hyperlipidemia, unspecified: Secondary | ICD-10-CM | POA: Diagnosis not present

## 2016-05-22 DIAGNOSIS — Z86010 Personal history of colonic polyps: Secondary | ICD-10-CM | POA: Diagnosis not present

## 2016-05-22 DIAGNOSIS — I10 Essential (primary) hypertension: Secondary | ICD-10-CM | POA: Diagnosis not present

## 2016-05-22 DIAGNOSIS — R011 Cardiac murmur, unspecified: Secondary | ICD-10-CM | POA: Diagnosis not present

## 2016-05-22 DIAGNOSIS — Z171 Estrogen receptor negative status [ER-]: Secondary | ICD-10-CM | POA: Diagnosis not present

## 2016-05-22 DIAGNOSIS — A159 Respiratory tuberculosis unspecified: Secondary | ICD-10-CM | POA: Diagnosis not present

## 2016-05-22 DIAGNOSIS — Z510 Encounter for antineoplastic radiation therapy: Secondary | ICD-10-CM | POA: Diagnosis not present

## 2016-05-23 ENCOUNTER — Ambulatory Visit
Admission: RE | Admit: 2016-05-23 | Discharge: 2016-05-23 | Disposition: A | Discharge status: Home / Self Care | Payer: 59 | Source: Ambulatory Visit | Attending: Radiation Oncology | Admitting: Radiation Oncology

## 2016-05-23 ENCOUNTER — Encounter: Payer: Self-pay | Admitting: Radiation Oncology

## 2016-05-23 DIAGNOSIS — Z171 Estrogen receptor negative status [ER-]: Secondary | ICD-10-CM | POA: Diagnosis not present

## 2016-05-23 DIAGNOSIS — I10 Essential (primary) hypertension: Secondary | ICD-10-CM | POA: Diagnosis not present

## 2016-05-23 DIAGNOSIS — A159 Respiratory tuberculosis unspecified: Secondary | ICD-10-CM | POA: Diagnosis not present

## 2016-05-23 DIAGNOSIS — E785 Hyperlipidemia, unspecified: Secondary | ICD-10-CM | POA: Diagnosis not present

## 2016-05-23 DIAGNOSIS — Z86010 Personal history of colonic polyps: Secondary | ICD-10-CM | POA: Diagnosis not present

## 2016-05-23 DIAGNOSIS — R011 Cardiac murmur, unspecified: Secondary | ICD-10-CM | POA: Diagnosis not present

## 2016-05-23 DIAGNOSIS — Z510 Encounter for antineoplastic radiation therapy: Secondary | ICD-10-CM | POA: Diagnosis not present

## 2016-05-23 DIAGNOSIS — C50412 Malignant neoplasm of upper-outer quadrant of left female breast: Secondary | ICD-10-CM | POA: Diagnosis not present

## 2016-05-23 NOTE — Progress Notes (Signed)
Paperwork Holland Falling) received, patient advised no further need, given to pt 05/23/16

## 2016-05-24 ENCOUNTER — Ambulatory Visit
Admission: RE | Admit: 2016-05-24 | Discharge: 2016-05-24 | Disposition: A | Discharge status: Home / Self Care | Payer: 59 | Source: Ambulatory Visit | Attending: Radiation Oncology | Admitting: Radiation Oncology

## 2016-05-24 DIAGNOSIS — Z171 Estrogen receptor negative status [ER-]: Secondary | ICD-10-CM | POA: Diagnosis not present

## 2016-05-24 DIAGNOSIS — R011 Cardiac murmur, unspecified: Secondary | ICD-10-CM | POA: Diagnosis not present

## 2016-05-24 DIAGNOSIS — Z510 Encounter for antineoplastic radiation therapy: Secondary | ICD-10-CM | POA: Diagnosis not present

## 2016-05-24 DIAGNOSIS — A159 Respiratory tuberculosis unspecified: Secondary | ICD-10-CM | POA: Diagnosis not present

## 2016-05-24 DIAGNOSIS — I10 Essential (primary) hypertension: Secondary | ICD-10-CM | POA: Diagnosis not present

## 2016-05-24 DIAGNOSIS — Z86010 Personal history of colonic polyps: Secondary | ICD-10-CM | POA: Diagnosis not present

## 2016-05-24 DIAGNOSIS — E785 Hyperlipidemia, unspecified: Secondary | ICD-10-CM | POA: Diagnosis not present

## 2016-05-24 DIAGNOSIS — C50412 Malignant neoplasm of upper-outer quadrant of left female breast: Secondary | ICD-10-CM | POA: Diagnosis not present

## 2016-05-25 ENCOUNTER — Ambulatory Visit
Admission: RE | Admit: 2016-05-25 | Discharge: 2016-05-25 | Disposition: A | Discharge status: Home / Self Care | Payer: 59 | Source: Ambulatory Visit | Attending: Radiation Oncology | Admitting: Radiation Oncology

## 2016-05-25 DIAGNOSIS — E785 Hyperlipidemia, unspecified: Secondary | ICD-10-CM | POA: Diagnosis not present

## 2016-05-25 DIAGNOSIS — Z510 Encounter for antineoplastic radiation therapy: Secondary | ICD-10-CM | POA: Diagnosis not present

## 2016-05-25 DIAGNOSIS — R011 Cardiac murmur, unspecified: Secondary | ICD-10-CM | POA: Diagnosis not present

## 2016-05-25 DIAGNOSIS — I10 Essential (primary) hypertension: Secondary | ICD-10-CM | POA: Diagnosis not present

## 2016-05-25 DIAGNOSIS — Z171 Estrogen receptor negative status [ER-]: Secondary | ICD-10-CM | POA: Diagnosis not present

## 2016-05-25 DIAGNOSIS — A159 Respiratory tuberculosis unspecified: Secondary | ICD-10-CM | POA: Diagnosis not present

## 2016-05-25 DIAGNOSIS — Z86010 Personal history of colonic polyps: Secondary | ICD-10-CM | POA: Diagnosis not present

## 2016-05-25 DIAGNOSIS — C50412 Malignant neoplasm of upper-outer quadrant of left female breast: Secondary | ICD-10-CM | POA: Diagnosis not present

## 2016-05-27 ENCOUNTER — Ambulatory Visit: Payer: 59

## 2016-05-28 ENCOUNTER — Ambulatory Visit
Admission: RE | Admit: 2016-05-28 | Discharge: 2016-05-28 | Disposition: A | Discharge status: Home / Self Care | Payer: 59 | Source: Ambulatory Visit | Attending: Radiation Oncology | Admitting: Radiation Oncology

## 2016-05-28 ENCOUNTER — Ambulatory Visit: Payer: 59

## 2016-05-28 ENCOUNTER — Encounter: Payer: Self-pay | Admitting: Radiation Oncology

## 2016-05-28 VITALS — BP 137/85 | HR 87 | Temp 98.4°F | Ht 65.0 in | Wt 268.6 lb

## 2016-05-28 DIAGNOSIS — Z86010 Personal history of colonic polyps: Secondary | ICD-10-CM | POA: Diagnosis not present

## 2016-05-28 DIAGNOSIS — C50412 Malignant neoplasm of upper-outer quadrant of left female breast: Secondary | ICD-10-CM | POA: Diagnosis not present

## 2016-05-28 DIAGNOSIS — I10 Essential (primary) hypertension: Secondary | ICD-10-CM | POA: Diagnosis not present

## 2016-05-28 DIAGNOSIS — E785 Hyperlipidemia, unspecified: Secondary | ICD-10-CM | POA: Diagnosis not present

## 2016-05-28 DIAGNOSIS — R011 Cardiac murmur, unspecified: Secondary | ICD-10-CM | POA: Diagnosis not present

## 2016-05-28 DIAGNOSIS — Z171 Estrogen receptor negative status [ER-]: Principal | ICD-10-CM

## 2016-05-28 DIAGNOSIS — A159 Respiratory tuberculosis unspecified: Secondary | ICD-10-CM | POA: Diagnosis not present

## 2016-05-28 DIAGNOSIS — Z510 Encounter for antineoplastic radiation therapy: Secondary | ICD-10-CM | POA: Diagnosis not present

## 2016-05-28 NOTE — Progress Notes (Signed)
   Weekly Management Note:  Outpatient    ICD-9-CM ICD-10-CM   1. Malignant neoplasm of upper-outer quadrant of left breast in female, estrogen receptor negative (HCC) 174.4 C50.412    V86.1 Z17.1     Current Dose 46.8 Gy  Projected Dose: 50.4 Gy  Initial to left breast  Narrative:  The patient presents for routine under treatment assessment.  CBCT/MVCT images/Port film x-rays were reviewed.  The chart was checked. More moist peeling at IM fold, left.  Dryness left axilla. Started to use vaseline b/c the neosporin burned.  Physical Findings:  height is 5\' 5"  (1.651 m) and weight is 268 lb 9.6 oz (121.8 kg). Her temperature is 98.4 F (36.9 C). Her blood pressure is 137/85 and her pulse is 87. Her oxygen saturation is 100%.   Wt Readings from Last 3 Encounters:  05/28/16 268 lb 9.6 oz (121.8 kg)  05/21/16 271 lb 6.4 oz (123.1 kg)  05/14/16 270 lb 3.2 oz (122.6 kg)   Left breast -  Hyperpigmentation with dryness in axilla. Superficial but diffuse moist peeling at IM fold  Impression:  The patient is tolerating radiotherapy.  Plan:  Continue radiotherapy as planned. Patient instructed to instead use our own samples of triple antibiotic ointment at IM fold rather than her own neosporin and see if that is better tolerated.    Aquaphor to be used instead of vaseline for dry areas. ________________________________   Eppie Gibson, M.D.

## 2016-05-28 NOTE — Progress Notes (Addendum)
Kim Roberts presents for her 26th fraction of radiation to her Left Breast. She reports pain a 5/10 to her Left Axilla and underneath her Left Breast. She plans to start taking ibuprofen or oxycodone for this pain today. She has some fatigue, some days are better than others. She has some dry peeling noted to her Left Axillary area. She also has peeling noted to the area underneath her Left Breast. This area appears slightly more moist, but she denies drainage from this area. She stopped using neosporin to these areas yesterday and started using vaseline instead. She continues to use radiaplex to the areas of her Left Breast.  BP 137/85   Pulse 87   Temp 98.4 F (36.9 C)   Ht 5\' 5"  (1.651 m)   Wt 268 lb 9.6 oz (121.8 kg)   SpO2 100% Comment: room air  BMI 44.70 kg/m    Wt Readings from Last 3 Encounters:  05/28/16 268 lb 9.6 oz (121.8 kg)  05/21/16 271 lb 6.4 oz (123.1 kg)  05/14/16 270 lb 3.2 oz (122.6 kg)

## 2016-05-29 ENCOUNTER — Ambulatory Visit: Payer: 59

## 2016-05-29 ENCOUNTER — Ambulatory Visit
Admission: RE | Admit: 2016-05-29 | Discharge: 2016-05-29 | Disposition: A | Discharge status: Home / Self Care | Payer: 59 | Source: Ambulatory Visit | Attending: Radiation Oncology | Admitting: Radiation Oncology

## 2016-05-29 DIAGNOSIS — E785 Hyperlipidemia, unspecified: Secondary | ICD-10-CM | POA: Diagnosis not present

## 2016-05-29 DIAGNOSIS — A159 Respiratory tuberculosis unspecified: Secondary | ICD-10-CM | POA: Diagnosis not present

## 2016-05-29 DIAGNOSIS — Z86010 Personal history of colonic polyps: Secondary | ICD-10-CM | POA: Diagnosis not present

## 2016-05-29 DIAGNOSIS — C50412 Malignant neoplasm of upper-outer quadrant of left female breast: Secondary | ICD-10-CM | POA: Diagnosis not present

## 2016-05-29 DIAGNOSIS — I10 Essential (primary) hypertension: Secondary | ICD-10-CM | POA: Diagnosis not present

## 2016-05-29 DIAGNOSIS — Z171 Estrogen receptor negative status [ER-]: Secondary | ICD-10-CM | POA: Diagnosis not present

## 2016-05-29 DIAGNOSIS — R011 Cardiac murmur, unspecified: Secondary | ICD-10-CM | POA: Diagnosis not present

## 2016-05-29 DIAGNOSIS — Z510 Encounter for antineoplastic radiation therapy: Secondary | ICD-10-CM | POA: Diagnosis not present

## 2016-05-30 ENCOUNTER — Ambulatory Visit: Payer: 59

## 2016-05-30 ENCOUNTER — Ambulatory Visit
Admission: RE | Admit: 2016-05-30 | Discharge: 2016-05-30 | Disposition: A | Discharge status: Home / Self Care | Payer: 59 | Source: Ambulatory Visit | Attending: Radiation Oncology | Admitting: Radiation Oncology

## 2016-05-30 DIAGNOSIS — E785 Hyperlipidemia, unspecified: Secondary | ICD-10-CM | POA: Diagnosis not present

## 2016-05-30 DIAGNOSIS — R011 Cardiac murmur, unspecified: Secondary | ICD-10-CM | POA: Diagnosis not present

## 2016-05-30 DIAGNOSIS — Z171 Estrogen receptor negative status [ER-]: Secondary | ICD-10-CM | POA: Diagnosis not present

## 2016-05-30 DIAGNOSIS — Z510 Encounter for antineoplastic radiation therapy: Secondary | ICD-10-CM | POA: Diagnosis not present

## 2016-05-30 DIAGNOSIS — A159 Respiratory tuberculosis unspecified: Secondary | ICD-10-CM | POA: Diagnosis not present

## 2016-05-30 DIAGNOSIS — Z86010 Personal history of colonic polyps: Secondary | ICD-10-CM | POA: Diagnosis not present

## 2016-05-30 DIAGNOSIS — C50412 Malignant neoplasm of upper-outer quadrant of left female breast: Secondary | ICD-10-CM | POA: Diagnosis not present

## 2016-05-30 DIAGNOSIS — I10 Essential (primary) hypertension: Secondary | ICD-10-CM | POA: Diagnosis not present

## 2016-05-31 ENCOUNTER — Ambulatory Visit: Payer: 59

## 2016-05-31 ENCOUNTER — Ambulatory Visit
Admission: RE | Admit: 2016-05-31 | Discharge: 2016-05-31 | Disposition: A | Discharge status: Home / Self Care | Payer: 59 | Source: Ambulatory Visit | Attending: Radiation Oncology | Admitting: Radiation Oncology

## 2016-05-31 DIAGNOSIS — C50412 Malignant neoplasm of upper-outer quadrant of left female breast: Secondary | ICD-10-CM | POA: Diagnosis not present

## 2016-05-31 DIAGNOSIS — A159 Respiratory tuberculosis unspecified: Secondary | ICD-10-CM | POA: Diagnosis not present

## 2016-05-31 DIAGNOSIS — E785 Hyperlipidemia, unspecified: Secondary | ICD-10-CM | POA: Diagnosis not present

## 2016-05-31 DIAGNOSIS — Z86010 Personal history of colonic polyps: Secondary | ICD-10-CM | POA: Diagnosis not present

## 2016-05-31 DIAGNOSIS — Z171 Estrogen receptor negative status [ER-]: Secondary | ICD-10-CM | POA: Diagnosis not present

## 2016-05-31 DIAGNOSIS — I10 Essential (primary) hypertension: Secondary | ICD-10-CM | POA: Diagnosis not present

## 2016-05-31 DIAGNOSIS — R011 Cardiac murmur, unspecified: Secondary | ICD-10-CM | POA: Diagnosis not present

## 2016-05-31 DIAGNOSIS — Z510 Encounter for antineoplastic radiation therapy: Secondary | ICD-10-CM | POA: Diagnosis not present

## 2016-06-01 ENCOUNTER — Ambulatory Visit
Admission: RE | Admit: 2016-06-01 | Discharge: 2016-06-01 | Disposition: A | Discharge status: Home / Self Care | Payer: 59 | Source: Ambulatory Visit | Attending: Radiation Oncology | Admitting: Radiation Oncology

## 2016-06-01 DIAGNOSIS — Z86010 Personal history of colonic polyps: Secondary | ICD-10-CM | POA: Diagnosis not present

## 2016-06-01 DIAGNOSIS — R011 Cardiac murmur, unspecified: Secondary | ICD-10-CM | POA: Diagnosis not present

## 2016-06-01 DIAGNOSIS — C50412 Malignant neoplasm of upper-outer quadrant of left female breast: Secondary | ICD-10-CM | POA: Diagnosis not present

## 2016-06-01 DIAGNOSIS — Z171 Estrogen receptor negative status [ER-]: Secondary | ICD-10-CM | POA: Diagnosis not present

## 2016-06-01 DIAGNOSIS — E785 Hyperlipidemia, unspecified: Secondary | ICD-10-CM | POA: Diagnosis not present

## 2016-06-01 DIAGNOSIS — A159 Respiratory tuberculosis unspecified: Secondary | ICD-10-CM | POA: Diagnosis not present

## 2016-06-01 DIAGNOSIS — Z510 Encounter for antineoplastic radiation therapy: Secondary | ICD-10-CM | POA: Diagnosis not present

## 2016-06-01 DIAGNOSIS — I10 Essential (primary) hypertension: Secondary | ICD-10-CM | POA: Diagnosis not present

## 2016-06-05 ENCOUNTER — Ambulatory Visit
Admission: RE | Admit: 2016-06-05 | Discharge: 2016-06-05 | Disposition: A | Discharge status: Home / Self Care | Payer: 59 | Source: Ambulatory Visit | Attending: Radiation Oncology | Admitting: Radiation Oncology

## 2016-06-05 ENCOUNTER — Encounter: Payer: Self-pay | Admitting: Radiation Oncology

## 2016-06-05 VITALS — BP 121/80 | HR 88 | Temp 97.8°F | Ht 65.0 in | Wt 270.0 lb

## 2016-06-05 DIAGNOSIS — Z171 Estrogen receptor negative status [ER-]: Secondary | ICD-10-CM | POA: Insufficient documentation

## 2016-06-05 DIAGNOSIS — C50412 Malignant neoplasm of upper-outer quadrant of left female breast: Secondary | ICD-10-CM | POA: Insufficient documentation

## 2016-06-05 DIAGNOSIS — Z86010 Personal history of colonic polyps: Secondary | ICD-10-CM | POA: Diagnosis not present

## 2016-06-05 DIAGNOSIS — I10 Essential (primary) hypertension: Secondary | ICD-10-CM | POA: Diagnosis not present

## 2016-06-05 DIAGNOSIS — R011 Cardiac murmur, unspecified: Secondary | ICD-10-CM | POA: Diagnosis not present

## 2016-06-05 DIAGNOSIS — A159 Respiratory tuberculosis unspecified: Secondary | ICD-10-CM | POA: Diagnosis not present

## 2016-06-05 DIAGNOSIS — Z510 Encounter for antineoplastic radiation therapy: Secondary | ICD-10-CM | POA: Diagnosis not present

## 2016-06-05 DIAGNOSIS — E785 Hyperlipidemia, unspecified: Secondary | ICD-10-CM | POA: Diagnosis not present

## 2016-06-05 MED ORDER — RADIAPLEXRX EX GEL
Freq: Once | CUTANEOUS | Status: AC
  Administered 2016-06-05: 14:00:00 via TOPICAL

## 2016-06-05 NOTE — Progress Notes (Signed)
Kim Roberts is here for her 31st fraction of radiation to her Left Breast. She denies pain today. She denies fatigue. She has hyperpigmentation to her Left Breast. She has peeling noted to the area underneath her Left Breast. It has increased in size over the last week. There is no drainage present. She is using neosporin, aquaphor, and Radiaplex to this area. She was given a one month follow up appointment today, and a second tube of Radiaplex.   BP 121/80   Pulse 88   Temp 97.8 F (36.6 C)   Ht 5\' 5"  (1.651 m)   Wt 270 lb (122.5 kg)   SpO2 98% Comment: room air  BMI 44.93 kg/m    Wt Readings from Last 3 Encounters:  06/05/16 270 lb (122.5 kg)  05/28/16 268 lb 9.6 oz (121.8 kg)  05/21/16 271 lb 6.4 oz (123.1 kg)

## 2016-06-05 NOTE — Progress Notes (Signed)
   Weekly Management Note:  Outpatient    ICD-9-CM ICD-10-CM   1. Malignant neoplasm of upper-outer quadrant of left breast in female, estrogen receptor negative (HCC) 174.4 C50.412 hyaluronate sodium (RADIAPLEXRX) gel   V86.1 Z17.1     Current Dose:  56.4 Gy  Projected Dose: 60.4 Gy   Narrative:  The patient presents for routine under treatment assessment.  CBCT/MVCT images/Port film x-rays were reviewed.  The chart was checked.   Kim Roberts is here for her 31st fraction of radiation to her Left Breast. She denies pain today. She denies fatigue. She has hyperpigmentation to her Left Breast. She has peeling noted to the area underneath her Left Breast. It has increased in size over the last week. There is no drainage present. She is using neosporin, aquaphor, and Radiaplex to this area. She was given a one month follow up appointment today, and a second tube of Radiaplex.   Physical Findings:  height is 5\' 5"  (1.651 m) and weight is 270 lb (122.5 kg). Her temperature is 97.8 F (36.6 C). Her blood pressure is 121/80 and her pulse is 88. Her oxygen saturation is 98%.   Wt Readings from Last 3 Encounters:  06/05/16 270 lb (122.5 kg)  05/28/16 268 lb 9.6 oz (121.8 kg)  05/21/16 271 lb 6.4 oz (123.1 kg)   New skin that has regenerated where she had prior desquamation in the inframammary fold and axilla on the left. Where her skin has peeled, there is erythematous tissue that has replaced it. In the rest of the breast, there is dryness and hyperpigmentation.   Impression:  The patient is tolerating radiotherapy.  Plan:  Continue radiotherapy as planned. She will complete treatment soon and will return for follow up in 1 month. I advised the patient to continue using Radiaplex until at least mid-January then switch to Vitamin E oil/lotion to promote skin healing and repair.   ________________________________   Kim Roberts, M.D.   This document serves as a record of services personally  performed by Kim Gibson, MD. It was created on her behalf by Arlyce Harman, a trained medical scribe. The creation of this record is based on the scribe's personal observations and the provider's statements to them. This document has been checked and approved by the attending provider.

## 2016-06-06 ENCOUNTER — Ambulatory Visit: Payer: 59

## 2016-06-06 ENCOUNTER — Ambulatory Visit
Admission: RE | Admit: 2016-06-06 | Discharge: 2016-06-06 | Disposition: A | Discharge status: Home / Self Care | Payer: 59 | Source: Ambulatory Visit | Attending: Radiation Oncology | Admitting: Radiation Oncology

## 2016-06-06 DIAGNOSIS — I10 Essential (primary) hypertension: Secondary | ICD-10-CM | POA: Diagnosis not present

## 2016-06-06 DIAGNOSIS — Z510 Encounter for antineoplastic radiation therapy: Secondary | ICD-10-CM | POA: Diagnosis not present

## 2016-06-06 DIAGNOSIS — A159 Respiratory tuberculosis unspecified: Secondary | ICD-10-CM | POA: Diagnosis not present

## 2016-06-06 DIAGNOSIS — Z86010 Personal history of colonic polyps: Secondary | ICD-10-CM | POA: Diagnosis not present

## 2016-06-06 DIAGNOSIS — C50412 Malignant neoplasm of upper-outer quadrant of left female breast: Secondary | ICD-10-CM | POA: Diagnosis not present

## 2016-06-06 DIAGNOSIS — Z171 Estrogen receptor negative status [ER-]: Secondary | ICD-10-CM | POA: Diagnosis not present

## 2016-06-06 DIAGNOSIS — E785 Hyperlipidemia, unspecified: Secondary | ICD-10-CM | POA: Diagnosis not present

## 2016-06-06 DIAGNOSIS — R011 Cardiac murmur, unspecified: Secondary | ICD-10-CM | POA: Diagnosis not present

## 2016-06-07 ENCOUNTER — Ambulatory Visit
Admission: RE | Admit: 2016-06-07 | Discharge: 2016-06-07 | Disposition: A | Discharge status: Home / Self Care | Payer: 59 | Source: Ambulatory Visit | Attending: Radiation Oncology | Admitting: Radiation Oncology

## 2016-06-07 ENCOUNTER — Telehealth: Payer: Self-pay | Admitting: *Deleted

## 2016-06-07 DIAGNOSIS — Z171 Estrogen receptor negative status [ER-]: Secondary | ICD-10-CM | POA: Diagnosis not present

## 2016-06-07 DIAGNOSIS — Z86010 Personal history of colonic polyps: Secondary | ICD-10-CM | POA: Diagnosis not present

## 2016-06-07 DIAGNOSIS — A159 Respiratory tuberculosis unspecified: Secondary | ICD-10-CM | POA: Diagnosis not present

## 2016-06-07 DIAGNOSIS — C50412 Malignant neoplasm of upper-outer quadrant of left female breast: Secondary | ICD-10-CM | POA: Diagnosis not present

## 2016-06-07 DIAGNOSIS — I10 Essential (primary) hypertension: Secondary | ICD-10-CM | POA: Diagnosis not present

## 2016-06-07 DIAGNOSIS — Z510 Encounter for antineoplastic radiation therapy: Secondary | ICD-10-CM | POA: Diagnosis not present

## 2016-06-07 DIAGNOSIS — E785 Hyperlipidemia, unspecified: Secondary | ICD-10-CM | POA: Diagnosis not present

## 2016-06-07 DIAGNOSIS — R011 Cardiac murmur, unspecified: Secondary | ICD-10-CM | POA: Diagnosis not present

## 2016-06-07 NOTE — Telephone Encounter (Signed)
  Oncology Nurse Navigator Documentation  Navigator Location: [facility] (06/07/16 1500)   )Navigator Encounter Type: Telephone (06/07/16 1500) Telephone: [facility] Cro[facility] Call (06/07/16 1500)                   Patient Visit Type: RadOnc (06/07/16 1500) Treatment Phase: Final Radiation Tx (06/07/16 1500)                            Time Spent with Patient: 15 (06/07/16 1500)

## 2016-06-08 ENCOUNTER — Encounter: Payer: Self-pay | Admitting: *Deleted

## 2016-06-08 ENCOUNTER — Ambulatory Visit: Payer: 59

## 2016-06-08 NOTE — Progress Notes (Signed)
[place] Work  Clinical Social Work was referred by Development worker, community due to financial concerns related to her medical bills.  Clinical Social Worker contacted patient at home to offer support and assess for needs.  Pt reports she is worried about her medical bills. She just finished radiation treatment and is on leave from work with no pay. CSW reviewed possible options for help through [facility] or [facility]. Since pt has completed treatment there are limited options available to help. She plans to review online both of these resources income guidelines and contact CSW for assistance with application process as needed. Pt denied other needs currently.     Clinical Social Work interventions:  Resource education and referral Loren Racer, [place] Worker [place] Phone: [phone] Fax: [phone]

## 2016-06-18 ENCOUNTER — Ambulatory Visit (HOSPITAL_COMMUNITY)
Admission: EM | Admit: 2016-06-18 | Discharge: 2016-06-18 | Disposition: A | Discharge status: Home / Self Care | Payer: 59 | Attending: Emergency Medicine | Admitting: Emergency Medicine

## 2016-06-18 ENCOUNTER — Encounter (HOSPITAL_COMMUNITY): Payer: Self-pay | Admitting: Emergency Medicine

## 2016-06-18 DIAGNOSIS — S39012A Strain of muscle, fascia and tendon of lower back, initial encounter: Secondary | ICD-10-CM | POA: Diagnosis not present

## 2016-06-18 DIAGNOSIS — S161XXA Strain of muscle, fascia and tendon at neck level, initial encounter: Secondary | ICD-10-CM | POA: Diagnosis not present

## 2016-06-18 MED ORDER — HYDROCODONE-ACETAMINOPHEN 5-325 MG PO TABS
1.0000 | ORAL_TABLET | Freq: Once | ORAL | Status: AC
  Administered 2016-06-18: 1 via ORAL

## 2016-06-18 MED ORDER — MELOXICAM 7.5 MG PO TABS: 7.5000 mg | ORAL_TABLET | Freq: Every day | ORAL | 0 refills | Status: DC

## 2016-06-18 MED ORDER — HYDROCODONE-ACETAMINOPHEN 5-325 MG PO TABS: 1.0000 | ORAL_TABLET | Freq: Four times a day (QID) | ORAL | 0 refills | Status: DC | PRN

## 2016-06-18 MED ORDER — CYCLOBENZAPRINE HCL 5 MG PO TABS: 5.0000 mg | ORAL_TABLET | Freq: Three times a day (TID) | ORAL | 0 refills | Status: DC | PRN

## 2016-06-18 MED ORDER — HYDROCODONE-ACETAMINOPHEN 5-325 MG PO TABS
ORAL_TABLET | ORAL | Status: AC
  Filled 2016-06-18: qty 1

## 2016-06-18 MED FILL — HYDROCODON-APAP 5-325: 5-325 | 3 days supply | Qty: 15 | Fill #0

## 2016-06-18 MED FILL — MELOXICAM 7.5 MG TABLET: 7.5 | 30 days supply | Qty: 30 | Fill #0

## 2016-06-18 MED FILL — CYCLOBENZAPRINE 5 MG TABLET: 5 | 10 days supply | Qty: 30 | Fill #0
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
## 2016-06-21 ENCOUNTER — Ambulatory Visit (INDEPENDENT_AMBULATORY_CARE_PROVIDER_SITE_OTHER): Payer: 59 | Admitting: Family Medicine

## 2016-06-21 ENCOUNTER — Encounter: Payer: Self-pay | Admitting: Family Medicine

## 2016-06-21 DIAGNOSIS — M542 Cervicalgia: Secondary | ICD-10-CM | POA: Diagnosis not present
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
## 2016-06-21 NOTE — Patient Instructions (Signed)
Take meloxicam with food, daily.  Use flexeril if needed for spasm.  Sedation caution with hydrocodone and flexeril.  Take care.  Glad to see you.

## 2016-06-21 NOTE — Progress Notes (Signed)
Pre visit review using our clinic review tool, if applicable. No additional management support is needed unless otherwise documented below in the visit note. 

## 2016-06-22 NOTE — Assessment & Plan Note (Signed)
She is some better today but not ready/able to go back to work.  Take meloxicam with food, daily.  Use flexeril if needed for spasm.  Sedation caution with hydrocodone and flexeril.  Return to work per note.  Update me as needed.  Should gradually improve.   D/w pt.  She agrees.

## 2016-06-25 ENCOUNTER — Encounter: Payer: Self-pay | Admitting: Gastroenterology

## 2016-06-26 ENCOUNTER — Encounter: Payer: Self-pay | Admitting: *Deleted

## 2016-06-26 ENCOUNTER — Telehealth: Payer: Self-pay | Admitting: *Deleted

## 2016-06-26 NOTE — Telephone Encounter (Signed)
Mult good options locally, I'm okay with her seeing anyone locally.  Would go with who can get her in first.  Okay to give note to return to work on 07/02/16, please give if needed.  Thanks.

## 2016-06-26 NOTE — Telephone Encounter (Signed)
Patient advised. Work note written, signed and placed at front desk for pickup.

## 2016-06-26 NOTE — Telephone Encounter (Signed)
Patient left a voicemail stating that she was seen last week for low back pain, which she is still having pain. Patient stated that she has a note to go back to work tonight, but is not going to be able to go. Patient stated that she is taking the medication and feels that is may be helping some, but still having pain. Patient stated that she thinks that she needs to see a chiropractor and wants to know who you would recommend?

## 2016-06-29 ENCOUNTER — Encounter: Payer: Self-pay | Admitting: Radiation Oncology

## 2016-06-29 NOTE — Progress Notes (Signed)
  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           Left Breast boosted to an additional 10 Gy in 5 fractions.  Beams/energy:   Left Breast : 3D  //  10X, 15X        Boost : Isodose Plan  //  10X, 6X  Narrative: The patient tolerated radiation treatment relatively well. She experienced some peeling to the treatment area, and reported using Radiaplex, Neosporin, and Aquaphor to the area as directed. She denied pain or fatigue throughout treatment.  Plan: The patient has completed radiation treatment. The patient was advised to use Radiaplex until mid-January and then switch to Vitamin E oil/lotion to promote skin healing and repair. The patient will return to radiation oncology clinic for routine followup in one month. I advised them to call or return sooner if they have any questions or concerns related to their recovery or treatment.  -----------------------------------  SEppie Gibson MD  This document serves as a record of services personally performed by SEppie Gibson MD. It was created on her behalf by SMaryla Morrow a trained medical scribe. The creation of this record is based on the scribe's personal observations and the provider's statements to them. This document has been checked and approved by the attending provider.

## 2016-07-04 ENCOUNTER — Encounter: Payer: Self-pay | Admitting: Radiation Oncology

## 2016-07-06 ENCOUNTER — Ambulatory Visit
Admission: RE | Admit: 2016-07-06 | Discharge: 2016-07-06 | Disposition: A | Discharge status: Home / Self Care | Payer: 59 | Source: Ambulatory Visit | Attending: Radiation Oncology | Admitting: Radiation Oncology

## 2016-07-06 ENCOUNTER — Encounter: Payer: Self-pay | Admitting: Radiation Oncology

## 2016-07-06 DIAGNOSIS — C50412 Malignant neoplasm of upper-outer quadrant of left female breast: Secondary | ICD-10-CM | POA: Insufficient documentation

## 2016-07-06 DIAGNOSIS — Z923 Personal history of irradiation: Secondary | ICD-10-CM | POA: Diagnosis not present

## 2016-07-06 DIAGNOSIS — Z171 Estrogen receptor negative status [ER-]: Secondary | ICD-10-CM | POA: Insufficient documentation

## 2016-07-06 HISTORY — DX: Personal history of irradiation: Z92.3
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
## 2016-07-09 ENCOUNTER — Encounter: Payer: Self-pay | Admitting: Radiation Oncology

## 2016-07-09 ENCOUNTER — Telehealth: Payer: Self-pay | Admitting: Gastroenterology

## 2016-07-09 NOTE — Progress Notes (Signed)
Paperwork (matrix) received 1/29, given to nurse

## 2016-07-11 NOTE — Telephone Encounter (Signed)
Pt has been set up for office visit.

## 2016-07-13 ENCOUNTER — Encounter: Payer: Self-pay | Admitting: Radiation Oncology

## 2016-07-13 NOTE — Progress Notes (Signed)
Paperwork (matrix) received from doctor faxed to [phone], confirmation received, copy mailed to patient 2/2

## 2016-07-26 ENCOUNTER — Ambulatory Visit (INDEPENDENT_AMBULATORY_CARE_PROVIDER_SITE_OTHER): Payer: 59 | Admitting: Gastroenterology

## 2016-07-26 ENCOUNTER — Encounter: Payer: Self-pay | Admitting: Gastroenterology

## 2016-07-26 VITALS — BP 108/60 | HR 92 | Ht 65.0 in | Wt 271.8 lb

## 2016-07-26 DIAGNOSIS — Z800 Family history of malignant neoplasm of digestive organs: Secondary | ICD-10-CM | POA: Diagnosis not present

## 2016-07-26 DIAGNOSIS — Z860100 Personal history of colon polyps, unspecified: Secondary | ICD-10-CM | POA: Insufficient documentation

## 2016-07-26 MED ORDER — NA SULFATE-K SULFATE-MG SULF 17.5-3.13-1.6 GM/177ML PO SOLN: 1.0000 | Freq: Once | ORAL | 0 refills | Status: AC

## 2016-07-26 NOTE — Progress Notes (Signed)
I agree with the above note, plan 

## 2016-07-26 NOTE — Progress Notes (Signed)
07/26/2016 Kim Roberts [id number] 10/25/1960   HISTORY OF PRESENT ILLNESS:  This is a pleasant 56 year old female who is known to Dr. Ardis Hughs. She has a family history of colon cancer in her mother and her brother. She also has a personal history of adenomatous colon polyps. Her last colonoscopy was in August 2012 at which time she was found have 2 small tubular adenomas that were removed. Recall was entered for 5 years. When she returned in the fall to have her colonoscopy performed she was undergoing radiation for treatment of breast cancer. The colonoscopy was postponed for that reason. She is here today to discuss scheduling her colonoscopy. She completed radiation at the end of December. She is feeling well. She has not received any chemotherapy and there are no plans for that. She is not having any GI complaints.   Past Medical History:  Diagnosis Date  . Colon polyp   . Heart murmur    as a child  . History of radiation therapy 04/19/16- 06/07/16   Left Breast/ 50.4 Gy in 28 fractions, Left Breast boosted/ 10 Gy in 5 fractions  . Hyperlipidemia   . Hypertension   . Menopause   . 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    Past Surgical History:  Procedure Laterality Date  . BREAST LUMPECTOMY Left 03/19/2016   Procedure: SEED LOCALIZED LEFT BREAST LUMPECTOMY;  Surgeon: Stark Klein, MD;  Location: [facility];  Service: General;  Laterality: Left;  . COLONOSCOPY    . MASS EXCISION  08/30/2011   Procedure: EXCISION MASS;  Surgeon: Stark Klein, MD;  Location: [facility];  Service: General;  Laterality: Right;  Excision of Right Axillary Mass   . POLYPECTOMY      reports that she has never smoked. She has never used smokeless tobacco. She reports that she drinks alcohol. She reports that she does not use drugs. family history includes Asthma in her other; Colon cancer (age of onset: 81) in her brother; Colon cancer (age of onset: 15) in her mother; Diabetes in her  paternal uncle; Lung cancer (age of onset: 80) in her father; Seizures in her brother. Allergies  Allergen Reactions  . Latex Rash      Outpatient Encounter Prescriptions as of 07/26/2016  Medication Sig  . hydrochlorothiazide (HYDRODIURIL) 25 MG tablet Take 1 tablet (25 mg total) by mouth daily.  Marland Kitchen HYDROcodone-acetaminophen (NORCO) 5-325 MG tablet Take 1 tablet by mouth every 6 (six) hours as needed for moderate pain.  . meloxicam (MOBIC) 7.5 MG tablet Take 1 tablet (7.5 mg total) by mouth daily. (Patient taking differently: Take 7.5 mg by mouth as needed. )  . [DISCONTINUED] cyclobenzaprine (FLEXERIL) 5 MG tablet Take 1 tablet (5 mg total) by mouth 3 (three) times daily as needed for muscle spasms.   No facility-administered encounter medications on file as of 07/26/2016.      REVIEW OF SYSTEMS  : All other systems reviewed and negative except where noted in the History of Present Illness.   PHYSICAL EXAM: BP 108/60   Pulse 92   Ht 5\' 5"  (1.651 m)   Wt 271 lb 12.8 oz (123.3 kg)   BMI 45.23 kg/m  General: Well developed black female in no acute distress Head: Normocephalic and atraumatic Eyes:  Sclerae anicteric, conjunctiva pink. Ears: Normal auditory acuity Lungs: Clear throughout to auscultation Heart: Regular rate and rhythm Abdomen: Soft, non-distended. Normal bowel sounds.  Non-tender. Rectal:  Will be done at the  time of colonoscopy. Musculoskeletal: Symmetrical with no gross deformities  Skin: No lesions on visible extremities Extremities: No edema  Neurological: Alert oriented x 4, grossly non-focal Psychological:  Alert and cooperative. Normal mood and affect  ASSESSMENT AND PLAN: -Family history of colon cancer in mother and brother -Personal history of adenomatous polyps  *Will schedule for surveillance colonoscopy with Dr. Ardis Hughs.  The risks, benefits, and alternatives to colonoscopy were discussed with the patient and she consents to proceed.    CC:   Tonia Ghent, MD

## 2016-07-26 NOTE — Patient Instructions (Signed)

## 2016-08-06 MED FILL — HYDROCHLOROTHIAZIDE 25 MG T: 25 | 90 days supply | Qty: 90 | Fill #0

## 2016-08-06 MED FILL — SUPREP BOWEL PREP KIT: 17.5-3.13-1 | 1 days supply | Qty: 354 | Fill #0

## 2016-08-14 ENCOUNTER — Ambulatory Visit: Payer: 59 | Admitting: Gastroenterology

## 2016-08-17 NOTE — Progress Notes (Signed)
Photon Boost Complex Emergency planning/management officer Note Outpatient  Diagnosis: Breast Cancer   Malignant neoplasm of upper-outer quadrant of left female breast, unspecified estrogen receptor status ([place]) 174.4 C50.412      The patient's CT images from her simulation were reviewed to plan her boost treatment to her left breast  lumpectomy cavity.  The boost to the lumpectomy cavity will be delivered with 3 photon fields using MLCs for custom blocks again heart and lungs, with 10 and 6 MV photon energy.  This constitutes 3 complex treatment devices. Isodose plan was reviewed and approved. 10 Gy in 5 fractions prescribed.  -----------------------------------  Eppie Gibson, MD

## 2016-08-22 ENCOUNTER — Encounter: Payer: Self-pay | Admitting: Gastroenterology

## 2016-09-04 ENCOUNTER — Encounter: Payer: Self-pay | Admitting: Gastroenterology

## 2016-09-04 ENCOUNTER — Ambulatory Visit (AMBULATORY_SURGERY_CENTER): Payer: 59 | Admitting: Gastroenterology

## 2016-09-04 VITALS — BP 108/50 | HR 82 | Temp 98.6°F | Resp 18 | Ht 65.0 in | Wt 271.0 lb

## 2016-09-04 DIAGNOSIS — D122 Benign neoplasm of ascending colon: Secondary | ICD-10-CM | POA: Diagnosis not present

## 2016-09-04 DIAGNOSIS — D124 Benign neoplasm of descending colon: Secondary | ICD-10-CM | POA: Diagnosis not present

## 2016-09-04 DIAGNOSIS — Z86010 Personal history of colonic polyps: Secondary | ICD-10-CM | POA: Diagnosis not present

## 2016-09-04 DIAGNOSIS — Z800 Family history of malignant neoplasm of digestive organs: Secondary | ICD-10-CM | POA: Diagnosis not present

## 2016-09-04 DIAGNOSIS — I10 Essential (primary) hypertension: Secondary | ICD-10-CM | POA: Diagnosis not present

## 2016-09-04 DIAGNOSIS — D123 Benign neoplasm of transverse colon: Secondary | ICD-10-CM

## 2016-09-04 MED ORDER — SODIUM CHLORIDE 0.9 % IV SOLN: 500.0000 mL | INTRAVENOUS | Status: DC

## 2016-09-04 NOTE — Op Note (Signed)
[facility] Patient Name: Kim Roberts Procedure Date: 09/04/2016 3:11 PM MRN: [id number] Endoscopist: Milus Banister , MD Age: 56 Referring MD:  Date of Birth: 1961/02/16 Gender: Female Account #: [id number] Procedure:                Colonoscopy Indications:              Screening in patient at increased risk: Family                            history of 1st-degree relative with colorectal                            cancer before age 8 years, High risk colon cancer                            surveillance: Personal history of colonic polyps Medicines:                Monitored Anesthesia Care Procedure:                Pre-Anesthesia Assessment:                           - Prior to the procedure, a History and Physical                            was performed, and patient medications and                            allergies were reviewed. The patient's tolerance of                            previous anesthesia was also reviewed. The risks                            and benefits of the procedure and the sedation                            options and risks were discussed with the patient.                            All questions were answered, and informed consent                            was obtained. Prior Anticoagulants: The patient has                            taken no previous anticoagulant or antiplatelet                            agents. ASA Grade Assessment: II - A patient with                            mild systemic disease. After reviewing the risks  and benefits, the patient was deemed in                            satisfactory condition to undergo the procedure.                           After obtaining informed consent, the colonoscope                            was passed under direct vision. Throughout the                            procedure, the patient's blood pressure, pulse, and                            oxygen  saturations were monitored continuously. The                            Colonoscope was introduced through the anus and                            advanced to the the cecum, identified by                            appendiceal orifice and ileocecal valve. The                            colonoscopy was performed without difficulty. The                            patient tolerated the procedure well. The quality                            of the bowel preparation was good. The ileocecal                            valve, appendiceal orifice, and rectum were                            photographed. Scope In: 3:13:47 PM Scope Out: 3:28:37 PM Scope Withdrawal Time: 0 hours 13 minutes 0 seconds  Total Procedure Duration: 0 hours 14 minutes 50 seconds  Findings:                 Six sessile polyps were found in the descending                            colon, transverse colon and ascending colon. The                            polyps were 3 to 6 mm in size. These polyps were                            removed with a cold snare. Resection and retrieval  were complete.                           The exam was otherwise without abnormality on                            direct and retroflexion views. Complications:            No immediate complications. Estimated blood loss:                            None. Estimated Blood Loss:     Estimated blood loss: none. Impression:               - Six 3 to 6 mm polyps in the descending colon, in                            the transverse colon and in the ascending colon,                            removed with a cold snare. Resected and retrieved.                           - The examination was otherwise normal on direct                            and retroflexion views. Recommendation:           - Patient has a contact number available for                            emergencies. The signs and symptoms of potential                             delayed complications were discussed with the                            patient. Return to normal activities tomorrow.                            Written discharge instructions were provided to the                            patient.                           - Resume previous diet.                           - Continue present medications.                           You will receive a letter within 2-3 weeks with the                            pathology results and my final recommendations.  If the polyp(s) is proven to be 'pre-cancerous' on                            pathology, you will need repeat colonoscopy in 3-5                            years. Milus Banister, MD 09/04/2016 3:31:08 PM This report has been signed electronically.

## 2016-09-04 NOTE — Progress Notes (Signed)
Called to room to assist during endoscopic procedure.  Patient ID and intended procedure confirmed with present staff. Received instructions for my participation in the procedure from the performing physician.  

## 2016-09-04 NOTE — Patient Instructions (Signed)
YOU HAD AN ENDOSCOPIC PROCEDURE TODAY AT THE [facility]:   Refer to the procedure report that was given to you for any specific questions about what was found during the examination.  If the procedure report does not answer your questions, please call your gastroenterologist to clarify.  If you requested that your care partner not be given the details of your procedure findings, then the procedure report has been included in a sealed envelope for you to review at your convenience later.  YOU SHOULD EXPECT: Some feelings of bloating in the abdomen. Passage of more gas than usual.  Walking can help get rid of the air that was put into your GI tract during the procedure and reduce the bloating. If you had a lower endoscopy (such as a colonoscopy or flexible sigmoidoscopy) you may notice spotting of blood in your stool or on the toilet paper. If you underwent a bowel prep for your procedure, you may not have a normal bowel movement for a few days.  Please Note:  You might notice some irritation and congestion in your nose or some drainage.  This is from the oxygen used during your procedure.  There is no need for concern and it should clear up in a day or so.  SYMPTOMS TO REPORT IMMEDIATELY:   Following lower endoscopy (colonoscopy or flexible sigmoidoscopy):  Excessive amounts of blood in the stool  Significant tenderness or worsening of abdominal pains  Swelling of the abdomen that is new, acute  Fever of 100F or higher   For urgent or emergent issues, a gastroenterologist can be reached at any hour by calling [phone].   DIET:  We do recommend a small meal at first, but then you may proceed to your regular diet.  Drink plenty of fluids but you should avoid alcoholic beverages for 24 hours.  ACTIVITY:  You should plan to take it easy for the rest of today and you should NOT DRIVE or use heavy machinery until tomorrow (because of the sedation medicines used during the test).     FOLLOW UP: Our staff will call the number listed on your records the next business day following your procedure to check on you and address any questions or concerns that you may have regarding the information given to you following your procedure. If we do not reach you, we will leave a message.  However, if you are feeling well and you are not experiencing any problems, there is no need to return our call.  We will assume that you have returned to your regular daily activities without incident.  If any biopsies were taken you will be contacted by phone or by letter within the next 1-3 weeks.  Please call us at [phone] if you have not heard about the biopsies in 3 weeks.    SIGNATURES/CONFIDENTIALITY: You and/or your care partner have signed paperwork which will be entered into your electronic medical record.  These signatures attest to the fact that that the information above on your After Visit Summary has been reviewed and is understood.  Full responsibility of the confidentiality of this discharge information lies with you and/or your care-partner.  Polyp information given. 

## 2016-09-04 NOTE — Progress Notes (Signed)
Report to PACU, RN, vss, BBS= Clear.  

## 2016-09-05 ENCOUNTER — Telehealth: Payer: Self-pay

## 2016-09-05 NOTE — Telephone Encounter (Signed)
  Follow up Call-  Call back number 09/04/2016  Post procedure Call Back phone  # [phone]  Permission to leave phone message Yes  Some recent data might be hidden     Patient questions:  Do you have a fever, pain , or abdominal swelling? No. Pain Score  0 *  Have you tolerated food without any problems? Yes.    Have you been able to return to your normal activities? Yes.    Do you have any questions about your discharge instructions: Diet   No. Medications  No. Follow up visit  No.  Do you have questions or concerns about your Care? No.  Actions: * If pain score is 4 or above: No action needed, pain <4.

## 2016-09-11 DIAGNOSIS — H52203 Unspecified astigmatism, bilateral: Secondary | ICD-10-CM | POA: Diagnosis not present

## 2016-09-11 DIAGNOSIS — H5213 Myopia, bilateral: Secondary | ICD-10-CM | POA: Diagnosis not present

## 2016-09-11 DIAGNOSIS — H524 Presbyopia: Secondary | ICD-10-CM | POA: Diagnosis not present

## 2016-09-14 ENCOUNTER — Encounter: Payer: Self-pay | Admitting: Gastroenterology

## 2016-09-27 DIAGNOSIS — R400 Somnolence: Secondary | ICD-10-CM | POA: Diagnosis not present

## 2016-09-27 DIAGNOSIS — J3489 Other specified disorders of nose and nasal sinuses: Secondary | ICD-10-CM | POA: Insufficient documentation

## 2016-09-27 DIAGNOSIS — I10 Essential (primary) hypertension: Secondary | ICD-10-CM | POA: Diagnosis not present

## 2016-09-27 DIAGNOSIS — E669 Obesity, unspecified: Secondary | ICD-10-CM | POA: Insufficient documentation

## 2016-09-27 DIAGNOSIS — M950 Acquired deformity of nose: Secondary | ICD-10-CM | POA: Diagnosis not present

## 2016-09-27 DIAGNOSIS — R0683 Snoring: Secondary | ICD-10-CM | POA: Insufficient documentation

## 2016-09-27 DIAGNOSIS — Z7289 Other problems related to lifestyle: Secondary | ICD-10-CM | POA: Diagnosis not present

## 2016-09-27 DIAGNOSIS — Z6841 Body Mass Index (BMI) 40.0 and over, adult: Secondary | ICD-10-CM | POA: Diagnosis not present

## 2016-10-03 ENCOUNTER — Other Ambulatory Visit (HOSPITAL_BASED_OUTPATIENT_CLINIC_OR_DEPARTMENT_OTHER): Payer: Self-pay

## 2016-10-03 DIAGNOSIS — R0683 Snoring: Secondary | ICD-10-CM

## 2016-10-03 DIAGNOSIS — E669 Obesity, unspecified: Secondary | ICD-10-CM

## 2016-10-03 DIAGNOSIS — R5383 Other fatigue: Secondary | ICD-10-CM

## 2016-10-05 ENCOUNTER — Ambulatory Visit (HOSPITAL_BASED_OUTPATIENT_CLINIC_OR_DEPARTMENT_OTHER): Payer: 59 | Admitting: Hematology

## 2016-10-05 ENCOUNTER — Telehealth: Payer: Self-pay | Admitting: Hematology

## 2016-10-05 ENCOUNTER — Other Ambulatory Visit (HOSPITAL_BASED_OUTPATIENT_CLINIC_OR_DEPARTMENT_OTHER): Payer: 59

## 2016-10-05 VITALS — BP 144/70 | HR 86 | Temp 98.0°F | Resp 17 | Ht 65.0 in | Wt 271.3 lb

## 2016-10-05 DIAGNOSIS — E669 Obesity, unspecified: Secondary | ICD-10-CM | POA: Diagnosis not present

## 2016-10-05 DIAGNOSIS — Z171 Estrogen receptor negative status [ER-]: Secondary | ICD-10-CM

## 2016-10-05 DIAGNOSIS — D0512 Intraductal carcinoma in situ of left breast: Secondary | ICD-10-CM

## 2016-10-05 DIAGNOSIS — C50412 Malignant neoplasm of upper-outer quadrant of left female breast: Secondary | ICD-10-CM

## 2016-10-05 DIAGNOSIS — I10 Essential (primary) hypertension: Secondary | ICD-10-CM | POA: Diagnosis not present

## 2016-10-05 LAB — CBC WITH DIFFERENTIAL/PLATELET
BASO%: 0.2 % (ref 0.0–2.0)
Basophils Absolute: 0 10*3/uL (ref 0.0–0.1)
EOS%: 2.3 % (ref 0.0–7.0)
Eosinophils Absolute: 0.1 10*3/uL (ref 0.0–0.5)
HEMATOCRIT: 38.7 % (ref 34.8–46.6)
HEMOGLOBIN: 12.8 g/dL (ref 11.6–15.9)
LYMPH#: 3.1 10*3/uL (ref 0.9–3.3)
LYMPH%: 49.8 % — ABNORMAL HIGH (ref 14.0–49.7)
MCH: 30.8 pg (ref 25.1–34.0)
MCHC: 33.1 g/dL (ref 31.5–36.0)
MCV: 93.3 fL (ref 79.5–101.0)
MONO#: 0.4 10*3/uL (ref 0.1–0.9)
MONO%: 5.9 % (ref 0.0–14.0)
NEUT%: 41.8 % (ref 38.4–76.8)
NEUTROS ABS: 2.6 10*3/uL (ref 1.5–6.5)
PLATELETS: 275 10*3/uL (ref 145–400)
RBC: 4.15 10*6/uL (ref 3.70–5.45)
RDW: 14.7 % — ABNORMAL HIGH (ref 11.2–14.5)
WBC: 6.2 10*3/uL (ref 3.9–10.3)

## 2016-10-05 LAB — COMPREHENSIVE METABOLIC PANEL
ALBUMIN: 3.9 g/dL (ref 3.5–5.0)
ALK PHOS: 82 U/L (ref 40–150)
ALT: 37 U/L (ref 0–55)
AST: 28 U/L (ref 5–34)
Anion Gap: 12 mEq/L — ABNORMAL HIGH (ref 3–11)
BUN: 19.4 mg/dL (ref 7.0–26.0)
CALCIUM: 10.3 mg/dL (ref 8.4–10.4)
CO2: 29 mEq/L (ref 22–29)
Chloride: 101 mEq/L (ref 98–109)
Creatinine: 0.8 mg/dL (ref 0.6–1.1)
GLUCOSE: 140 mg/dL (ref 70–140)
POTASSIUM: 4 meq/L (ref 3.5–5.1)
SODIUM: 142 meq/L (ref 136–145)
Total Bilirubin: 0.6 mg/dL (ref 0.20–1.20)
Total Protein: 7.4 g/dL (ref 6.4–8.3)

## 2016-10-05 NOTE — Telephone Encounter (Signed)
Gave patient AVS and calender per 4/27 los. Yasheka from [facility] said the order is able to be seen on their end and someone from GI will contact the patient with the Mammo appt and the [place] appt.
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
## 2016-10-09 DIAGNOSIS — C50412 Malignant neoplasm of upper-outer quadrant of left female breast: Secondary | ICD-10-CM | POA: Diagnosis not present

## 2016-10-17 ENCOUNTER — Ambulatory Visit: Payer: 59 | Attending: General Surgery | Admitting: Physical Therapy

## 2016-10-17 DIAGNOSIS — M6281 Muscle weakness (generalized): Secondary | ICD-10-CM | POA: Diagnosis not present

## 2016-10-17 DIAGNOSIS — M25612 Stiffness of left shoulder, not elsewhere classified: Secondary | ICD-10-CM | POA: Insufficient documentation

## 2016-10-17 DIAGNOSIS — L599 Disorder of the skin and subcutaneous tissue related to radiation, unspecified: Secondary | ICD-10-CM | POA: Insufficient documentation
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
## 2016-10-23 ENCOUNTER — Ambulatory Visit: Payer: 59 | Admitting: Physical Therapy

## 2016-10-23 DIAGNOSIS — M6281 Muscle weakness (generalized): Secondary | ICD-10-CM | POA: Diagnosis not present

## 2016-10-23 DIAGNOSIS — M25612 Stiffness of left shoulder, not elsewhere classified: Secondary | ICD-10-CM

## 2016-10-23 DIAGNOSIS — L599 Disorder of the skin and subcutaneous tissue related to radiation, unspecified: Secondary | ICD-10-CM

## 2016-10-23 NOTE — Patient Instructions (Signed)
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
## 2016-10-24 ENCOUNTER — Ambulatory Visit: Payer: 59 | Admitting: Physical Therapy

## 2016-10-24 DIAGNOSIS — M25612 Stiffness of left shoulder, not elsewhere classified: Secondary | ICD-10-CM

## 2016-10-24 DIAGNOSIS — L599 Disorder of the skin and subcutaneous tissue related to radiation, unspecified: Secondary | ICD-10-CM | POA: Diagnosis not present

## 2016-10-24 DIAGNOSIS — M6281 Muscle weakness (generalized): Secondary | ICD-10-CM | POA: Diagnosis not present
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
## 2016-10-29 ENCOUNTER — Ambulatory Visit: Payer: 59 | Admitting: Physical Therapy

## 2016-10-29 DIAGNOSIS — M6281 Muscle weakness (generalized): Secondary | ICD-10-CM | POA: Diagnosis not present

## 2016-10-29 DIAGNOSIS — M25612 Stiffness of left shoulder, not elsewhere classified: Secondary | ICD-10-CM

## 2016-10-29 DIAGNOSIS — L599 Disorder of the skin and subcutaneous tissue related to radiation, unspecified: Secondary | ICD-10-CM
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
## 2016-10-31 ENCOUNTER — Ambulatory Visit: Payer: 59

## 2016-10-31 DIAGNOSIS — L599 Disorder of the skin and subcutaneous tissue related to radiation, unspecified: Secondary | ICD-10-CM | POA: Diagnosis not present

## 2016-10-31 DIAGNOSIS — M25612 Stiffness of left shoulder, not elsewhere classified: Secondary | ICD-10-CM

## 2016-10-31 DIAGNOSIS — M6281 Muscle weakness (generalized): Secondary | ICD-10-CM | POA: Diagnosis not present

## 2016-10-31 MED FILL — KETOROLAC 10 MG TABLET: 10 | 5 days supply | Qty: 20 | Fill #0
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
## 2016-11-06 ENCOUNTER — Ambulatory Visit: Payer: 59 | Admitting: Physical Therapy

## 2016-11-06 DIAGNOSIS — L599 Disorder of the skin and subcutaneous tissue related to radiation, unspecified: Secondary | ICD-10-CM

## 2016-11-06 DIAGNOSIS — M25612 Stiffness of left shoulder, not elsewhere classified: Secondary | ICD-10-CM

## 2016-11-06 DIAGNOSIS — M6281 Muscle weakness (generalized): Secondary | ICD-10-CM | POA: Diagnosis not present
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
## 2016-11-07 ENCOUNTER — Ambulatory Visit: Payer: 59 | Admitting: Physical Therapy

## 2016-11-07 DIAGNOSIS — M6281 Muscle weakness (generalized): Secondary | ICD-10-CM

## 2016-11-07 DIAGNOSIS — M25612 Stiffness of left shoulder, not elsewhere classified: Secondary | ICD-10-CM

## 2016-11-07 DIAGNOSIS — L599 Disorder of the skin and subcutaneous tissue related to radiation, unspecified: Secondary | ICD-10-CM | POA: Diagnosis not present
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
## 2016-11-07 NOTE — Patient Instructions (Signed)
Lie on your right side with knees bent. Take your left hand over to the right hip area. From there, raise the left hand up and out in a big diagonal, watching it and going out until you feel a stretch at the left shoulder/chest/breast area. Do 10 repetitions.  Next, put a can of [employer] soup in the left hand and repeat the exercise just above.  When that gets easy to do, could try increasing weight to your 3 lb. Dumbbell.  Cut repetitions to 5 to start with, and gradually increase to 10 reps.   Over Head Pull: Narrow and Wide Grip   Cancer Rehab [phone]   On back, knees bent, feet flat, band across thighs, elbows straight but relaxed. Pull hands apart (start). Keeping elbows straight, bring arms up and over head, hands toward floor. Keep pull steady on band. Hold momentarily. Return slowly, keeping pull steady, back to start. Then do same with a wider grip on the band (past shoulder width) Repeat _5-10__ times. Band color __red____   Side Pull: Double Arm   On back, knees bent, feet flat. Arms perpendicular to body, shoulder level, elbows straight but relaxed. Pull arms out to sides, elbows straight. Resistance band comes across collarbones, hands toward floor. Hold momentarily. Slowly return to starting position. Repeat _5-10__ times. Band color _red____   Sword   On back, knees bent, feet flat, left hand on left hip, right hand above left. Pull right arm DIAGONALLY (hip to shoulder) across chest. Bring right arm along head toward floor. Hold momentarily. Slowly return to starting position. Repeat _5-10__ times. Do with left arm. Band color _red_____   Shoulder Rotation: Double Arm   On back, knees bent, feet flat, elbows tucked at sides, bent 90, hands palms up. Pull hands apart and down toward floor, keeping elbows near sides. Hold momentarily. Slowly return to starting position. Repeat _5-10__ times. Band color __red____

## 2016-11-12 ENCOUNTER — Ambulatory Visit: Payer: 59 | Attending: General Surgery | Admitting: Physical Therapy

## 2016-11-12 DIAGNOSIS — M25612 Stiffness of left shoulder, not elsewhere classified: Secondary | ICD-10-CM | POA: Diagnosis not present

## 2016-11-12 DIAGNOSIS — L599 Disorder of the skin and subcutaneous tissue related to radiation, unspecified: Secondary | ICD-10-CM | POA: Insufficient documentation

## 2016-11-12 DIAGNOSIS — M6281 Muscle weakness (generalized): Secondary | ICD-10-CM | POA: Diagnosis not present
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
## 2016-11-14 ENCOUNTER — Ambulatory Visit: Payer: 59 | Admitting: Physical Therapy

## 2016-11-14 DIAGNOSIS — M6281 Muscle weakness (generalized): Secondary | ICD-10-CM | POA: Diagnosis not present

## 2016-11-14 DIAGNOSIS — M25612 Stiffness of left shoulder, not elsewhere classified: Secondary | ICD-10-CM

## 2016-11-14 DIAGNOSIS — L599 Disorder of the skin and subcutaneous tissue related to radiation, unspecified: Secondary | ICD-10-CM | POA: Diagnosis not present
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
## 2016-11-25 ENCOUNTER — Encounter (HOSPITAL_BASED_OUTPATIENT_CLINIC_OR_DEPARTMENT_OTHER): Payer: 59

## 2016-12-06 ENCOUNTER — Ambulatory Visit (HOSPITAL_BASED_OUTPATIENT_CLINIC_OR_DEPARTMENT_OTHER): Payer: 59 | Admitting: Adult Health

## 2016-12-06 ENCOUNTER — Encounter: Payer: Self-pay | Admitting: Adult Health

## 2016-12-06 VITALS — BP 136/73 | HR 99 | Temp 98.5°F | Resp 18 | Ht 65.0 in | Wt 266.6 lb

## 2016-12-06 DIAGNOSIS — Z171 Estrogen receptor negative status [ER-]: Secondary | ICD-10-CM

## 2016-12-06 DIAGNOSIS — C50412 Malignant neoplasm of upper-outer quadrant of left female breast: Secondary | ICD-10-CM | POA: Diagnosis not present

## 2016-12-06 NOTE — Progress Notes (Signed)
CLINIC:  Survivorship   REASON FOR VISIT:  Routine follow-up post-treatment for a recent history of breast cancer.  BRIEF ONCOLOGIC HISTORY:  Oncology History   Breast cancer of upper-outer quadrant of left female breast [facility])   Staging form: Breast, AJCC 7th Edition   - Clinical stage from 02/10/2016: Stage 0 (Tis (DCIS), N0, M0) - Signed by Truitt Merle, MD on 03/06/2016   - Pathologic: Stage IA (T59mc, N0, cM0) - Signed by SEppie Gibson MD on 04/04/2016      Breast cancer of upper-outer quadrant of left female breast (Kim Roberts   02/02/2016 Mammogram    Diagnostic mammogram showed 2 adjacent group of calcifications in the upper outer quadrant of left breast, suspicious for malignancy.      02/10/2016 Initial Diagnosis    Breast cancer of upper-outer quadrant of left female breast ([facility]      02/10/2016 Initial Biopsy    Left breast biopsy showed high grade DCIS . There is a small focus suspicious for early invasion.       02/10/2016 Receptors her2    ER negative, PR negative      03/19/2016 Surgery    Left breast lumpectomy      03/19/2016 Pathology Results    Left breast lumpectomy showed high-grade DCIS, 2.6 cm, with focal microinvasion (<0.1cm), margins were negative      03/19/2016 Receptors her2    ER negative, PR negative, HER-2 positive, with ratio 2.76 and copy # 15.3       04/27/2016 - 06/07/2016 Radiation Therapy    04/19/16 - 06/07/16  Site/dose:   Left Breast treated to 50.4 Gy in 28 fractions.                      Left Breast boosted to an additional 10 Gy in 5 fractions.       INTERVAL HISTORY:  [facility] to review her survivorship care plan detailing her treatment course for breast cancer, as well as monitoring long-term side effects of that treatment, education regarding health maintenance, screening, and overall wellness and health promotion.     Overall, Kim Roberts reports feeling quite  well .  She is not having any concerns today.      REVIEW OF SYSTEMS:  Review of Systems  Constitutional: Negative for appetite change, chills, fatigue, fever and unexpected weight change.  HENT:   Negative for hearing loss.   Eyes: Negative for eye problems and icterus.  Respiratory: Negative for chest tightness, cough and shortness of breath.   Cardiovascular: Negative for chest pain, leg swelling and palpitations.  Gastrointestinal: Negative for abdominal distention, abdominal pain, constipation, diarrhea, nausea and vomiting.  Endocrine: Negative for hot flashes.  Genitourinary: Negative for difficulty urinating.   Musculoskeletal: Negative for arthralgias.  Skin: Negative for itching and rash.  Neurological: Negative for dizziness, extremity weakness, headaches and numbness.  Hematological: Negative for adenopathy. Does not bruise/bleed easily.  Psychiatric/Behavioral: Negative for depression. The patient is not nervous/anxious.   Breast: Denies any new nodularity, masses, tenderness, nipple changes, or nipple discharge.      ONCOLOGY TREATMENT TEAM:  1. [facility]  [facility] 2. Medical Oncologist: Dr. FBurr Medico 3. Radiation Oncologist: Dr. SIsidore Moos   PAST MEDICAL/SURGICAL HISTORY:  Past Medical History:  Diagnosis Date  . Breast cancer (Kim Roberts   . Colon polyp   . Heart murmur    as a child  .  History of radiation therapy 04/19/16- 06/07/16   Left Breast/ 50.4 Gy in 28 fractions, Left Breast boosted/ 10 Gy in 5 fractions  . Hyperlipidemia   . Hypertension   . Menopause   . 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    Past Surgical History:  Procedure Laterality Date  . BREAST LUMPECTOMY Left 03/19/2016   Procedure: SEED LOCALIZED LEFT BREAST LUMPECTOMY;  Surgeon: Stark Klein, MD;  Location: [facility];  Service: General;  Laterality: Left;  . COLONOSCOPY    . MASS EXCISION  08/30/2011   Procedure: EXCISION MASS;  Surgeon: Stark Klein, MD;  Location: [facility];  Service: General;  Laterality: Right;  Excision of Right Axillary Mass   . POLYPECTOMY       ALLERGIES:  Allergies  Allergen Reactions  . Latex Rash     CURRENT MEDICATIONS:  Outpatient Encounter Prescriptions as of 12/06/2016  Medication Sig  . Cholecalciferol (VITAMIN D3) 2000 units capsule Take 2,000 Units by mouth daily.  . hydrochlorothiazide (HYDRODIURIL) 25 MG tablet Take 1 tablet (25 mg total) by mouth daily.  Marland Kitchen ketorolac (TORADOL) 10 MG tablet Take 10 mg by mouth every 6 (six) hours as needed.  . Menaquinone-7 (VITAMIN K2 PO) Take 100 mcg by mouth every morning.  . Multiple Vitamins-Minerals (HAIR/SKIN/NAILS/BIOTIN) TABS Take 5,000 mcg by mouth every morning.   Facility-Administered Encounter Medications as of 12/06/2016  Medication  . 0.9 %  sodium chloride infusion     ONCOLOGIC FAMILY HISTORY:  Family History  Problem Relation Age of Onset  . Colon cancer Mother 54  . Lung cancer Father 5  . Seizures Brother   . Colon cancer Brother 54  . Diabetes Paternal Uncle   . Asthma Other   . Esophageal cancer Neg Hx   . Stomach cancer Neg Hx   . Breast cancer Neg Hx       SOCIAL HISTORY:  Kim Roberts is Single and lives alone in [place], [place].  She doesn't have any children, however has family who live nearby.  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  She denies any current or history of tobacco, alcohol, or illicit drug use.     PHYSICAL EXAMINATION:  Vital Signs:   Vitals:   12/06/16 1432  BP: 136/73  Pulse: 99  Resp: 18  Temp: 98.5 F (36.9 C)   Filed Weights   12/06/16 1432  Weight: 266 lb 9.6 oz (120.9 kg)   General: Well-nourished, well-appearing female in no acute distress.  She is unaccompanie today.   HEENT: Head is normocephalic.  Pupils equal and reactive to light. Conjunctivae clear without exudate.  Sclerae anicteric. Oral mucosa is pink, moist.  Oropharynx is  pink without lesions or erythema.  Lymph: No cervical, supraclavicular, or infraclavicular lymphadenopathy noted on palpation.  Cardiovascular: Regular rate and rhythm.Marland Kitchen Respiratory: Clear to auscultation bilaterally. Chest expansion symmetric; breathing non-labored.  GI: Abdomen soft and round; non-tender, non-distended. Bowel sounds normoactive.  GU: Deferred.  Neuro: No focal deficits. Steady gait.  Psych: Mood and affect normal and appropriate for situation.  Extremities: No edema. MSK: No focal spinal tenderness to palpation.  Full range of motion in bilateral upper extremities Skin: Warm and dry.  LABORATORY DATA:  None for this visit.  DIAGNOSTIC IMAGING:  None for this visit.      ASSESSMENT AND PLAN:  Ms.. Roberts is a pleasant 56 y.o. female with Stage IA left breast invasive ductal carcinoma,  ER-/PR-/HER2+, diagnosed in 01/2016, treated with lumpectomy and adjuvant radiation therapy.  She presents to the Survivorship Clinic for our initial meeting and routine follow-up post-completion of treatment for breast cancer.    1. Stage IA left breast cancer:  Ms. Loberg is continuing to recover from definitive treatment for breast cancer. She will follow-up with her medical oncologist, Dr. Burr Medico in 03/2017 with history and physical exam per surveillance protocol.   Today, a comprehensive survivorship care plan and treatment summary was reviewed with the patient today detailing her breast cancer diagnosis, treatment course, potential late/long-term effects of treatment, appropriate follow-up care with recommendations for the future, and patient education resources.  A copy of this summary, along with a letter will be sent to the patient's primary care provider via mail/fax/In Basket message after today's visit.    2. Bone health:  Given Ms. Leach's history of breast cancer she is at slight risk for bone demineralization.  She was given education on specific activities to promote bone  health.  3. Cancer screening:  Due to Ms. Dunnavant's history and her age, she should receive screening for skin cancers, colon cancer, and gynecologic cancers.  The information and recommendations are listed on the patient's comprehensive care plan/treatment summary and were reviewed in detail with the patient.    4. Health maintenance and wellness promotion: Ms. Elenbaas was encouraged to consume 5-7 servings of fruits and vegetables per day. We reviewed the "Nutrition Rainbow" handout, as well as the handout "Take Control of Your Health and Reduce Your Cancer Risk" from the [place].  She was also encouraged to engage in moderate to vigorous exercise for 30 minutes per day most days of the week. We discussed the [employer] fitness program, which is designed for cancer survivors to help them become more physically fit after cancer treatments.  She was instructed to limit her alcohol consumption and continue to abstain from tobacco use.     5. Support services/counseling: It is not uncommon for this period of the patient's cancer care trajectory to be one of many emotions and stressors.  We discussed an opportunity for her to participate in the next session of [facility] ("Finding Your New Normal") support group series designed for patients after they have completed treatment.   Ms. Willbanks was encouraged to take advantage of our many other support services programs, support groups, and/or counseling in coping with her new life as a cancer survivor after completing anti-cancer treatment.  She was offered support today through active listening and expressive supportive counseling.  She was given information regarding our available services and encouraged to contact me with any questions or for help enrolling in any of our support group/programs.    Dispo:   -Return to cancer center for follow up with Dr. Burr Medico in 03/2017  -Mammogram due in 01/2017 -Follow up with surgery in January,  2019 -She is welcome to return back to the Survivorship Clinic at any time; no additional follow-up needed at this time.  -Consider referral back to survivorship as a long-term survivor for continued surveillance  A total of (30) minutes of face-to-face time was spent with this patient with greater than 50% of that time in counseling and care-coordination.   Gardenia Phlegm, NP Survivorship Program [place] [phone]   Note: PRIMARY CARE PROVIDER Tonia Ghen[place], [place] [phone]

## 2016-12-27 MED FILL — HYDROCHLOROTHIAZIDE 25 MG T: 25 | 90 days supply | Qty: 90 | Fill #1

## 2016-12-28 ENCOUNTER — Ambulatory Visit (HOSPITAL_BASED_OUTPATIENT_CLINIC_OR_DEPARTMENT_OTHER): Payer: 59 | Attending: Otolaryngology | Admitting: Internal Medicine

## 2016-12-28 VITALS — Ht 66.0 in | Wt 271.0 lb

## 2016-12-28 DIAGNOSIS — G4733 Obstructive sleep apnea (adult) (pediatric): Secondary | ICD-10-CM | POA: Diagnosis not present

## 2016-12-28 DIAGNOSIS — R0683 Snoring: Secondary | ICD-10-CM | POA: Insufficient documentation

## 2016-12-28 DIAGNOSIS — R5383 Other fatigue: Secondary | ICD-10-CM | POA: Diagnosis not present

## 2016-12-28 DIAGNOSIS — E669 Obesity, unspecified: Secondary | ICD-10-CM | POA: Diagnosis not present

## 2017-01-12 DIAGNOSIS — R0683 Snoring: Secondary | ICD-10-CM | POA: Diagnosis not present

## 2017-01-12 NOTE — Procedures (Signed)
  Patient Name: Kim Roberts, Marter Date: 12/28/2016 Gender: Female D.O.B: 23-Feb-1961 Age (years): 55 Referring Provider: Izora Gala Height (inches): 66 Interpreting Physician: Baird Lyons MD, ABSM Weight (lbs): 271 RPSGT: Earney Hamburg BMI: 44 MRN: [id number] Neck Size: 17.00 CLINICAL INFORMATION Sleep Study Type: NPSG  Indication for sleep study: Fatigue, Obesity, Snoring  Epworth Sleepiness Score: 4  SLEEP STUDY TECHNIQUE As per the AASM Manual for the Scoring of Sleep and Associated Events v2.3 (April 2016) with a hypopnea requiring 4% desaturations.  The channels recorded and monitored were frontal, central and occipital EEG, electrooculogram (EOG), submentalis EMG (chin), nasal and oral airflow, thoracic and abdominal wall motion, anterior tibialis EMG, snore microphone, electrocardiogram, and pulse oximetry.  MEDICATIONS Medications self-administered by patient taken the night of the study : none reported  SLEEP ARCHITECTURE The study was initiated at 9:29:09 PM and ended at 4:36:14 AM.  Sleep onset time was 77.9 minutes and the sleep efficiency was 54.4%. The total sleep time was 232.2 minutes.  Stage REM latency was 127.0 minutes.  The patient spent 3.01% of the night in stage N1 sleep, 84.93% in stage N2 sleep, 0.00% in stage N3 and 12.06% in REM.  Alpha intrusion was absent.  Supine sleep was 44.57%.  RESPIRATORY PARAMETERS The overall apnea/hypopnea index (AHI) was 41.3 per hour. There were 125 total apneas, including 125 obstructive, 0 central and 0 mixed apneas. There were 35 hypopneas and 20 RERAs.  The AHI during Stage REM sleep was 83.6 per hour.  AHI while supine was 70.1 per hour.  The mean oxygen saturation was 94.91%. The minimum SpO2 during sleep was 0.00%.  Moderate snoring was noted during this study.  CARDIAC DATA The 2 lead EKG demonstrated sinus rhythm. The mean heart rate was 37.09 beats per minute. Other EKG findings  include: None.  LEG MOVEMENT DATA The total PLMS were 0 with a resulting PLMS index of 0.00. Associated arousal with leg movement index was 0.0 .  IMPRESSIONS - Severe obstructive sleep apnea occurred during this study (AHI = 41.3/h). - No significant central sleep apnea occurred during this study (CAI = 0.0/h). - Minimal oxygen desaturation was noted during this study (Mean O2 = 94.90%). - The patient snored with Moderate snoring volume. - No cardiac abnormalities were noted during this study. - Clinically significant periodic limb movements did not occur during sleep. No significant associated arousals.  DIAGNOSIS - Obstructive Sleep Apnea (327.23 [G47.33 ICD-10])  RECOMMENDATIONS - Therapeutic CPAP titration to determine optimal pressure required to alleviate sleep disordered breathing. - Positional therapy avoiding supine position during sleep. - Avoid alcohol, sedatives and other CNS depressants that may worsen sleep apnea and disrupt normal sleep architecture. - Sleep hygiene should be reviewed to assess factors that may improve sleep quality. - Weight management and regular exercise should be initiated or continued if appropriate.  [Electronically signed] 01/12/2017 02:37 PM  Baird Lyons MD, [place], American Board of Sleep Medicine   NPI: [id number]  [place], [place] of Sleep Medicine  ELECTRONICALLY SIGNED ON:  01/12/2017, 2:34 PM [place] PH: [phone]   FX: [phone] [place]

## 2017-01-25 ENCOUNTER — Ambulatory Visit
Admission: RE | Admit: 2017-01-25 | Discharge: 2017-01-25 | Disposition: A | Discharge status: Home / Self Care | Payer: 59 | Source: Ambulatory Visit | Attending: Hematology | Admitting: Hematology

## 2017-01-25 DIAGNOSIS — R928 Other abnormal and inconclusive findings on diagnostic imaging of breast: Secondary | ICD-10-CM | POA: Diagnosis not present

## 2017-01-25 DIAGNOSIS — Z171 Estrogen receptor negative status [ER-]: Principal | ICD-10-CM

## 2017-01-25 DIAGNOSIS — C50412 Malignant neoplasm of upper-outer quadrant of left female breast: Secondary | ICD-10-CM

## 2017-01-25 HISTORY — DX: Personal history of irradiation: Z92.3

## 2017-01-25 HISTORY — DX: Personal history of antineoplastic chemotherapy: Z92.21

## 2017-01-29 ENCOUNTER — Encounter: Payer: Self-pay | Admitting: Physician Assistant

## 2017-01-29 ENCOUNTER — Ambulatory Visit: Payer: Self-pay | Admitting: Physician Assistant

## 2017-01-29 VITALS — BP 138/90 | HR 87 | Temp 98.3°F

## 2017-01-29 DIAGNOSIS — B029 Zoster without complications: Secondary | ICD-10-CM

## 2017-01-29 MED ORDER — FAMCICLOVIR 500 MG PO TABS: 500.0000 mg | ORAL_TABLET | Freq: Three times a day (TID) | ORAL | 0 refills | Status: DC

## 2017-01-29 MED ORDER — METHYLPREDNISOLONE 4 MG PO TBPK: ORAL_TABLET | ORAL | 0 refills | Status: DC

## 2017-01-29 MED ORDER — TRAMADOL HCL 50 MG PO TABS: 50.0000 mg | ORAL_TABLET | Freq: Three times a day (TID) | ORAL | 0 refills | Status: DC | PRN

## 2017-01-29 MED ORDER — OXYCODONE-ACETAMINOPHEN 5-325 MG PO TABS: 1.0000 | ORAL_TABLET | Freq: Every day | ORAL | 0 refills | Status: DC

## 2017-01-29 NOTE — Progress Notes (Signed)
S: c/o pain in left shoulder and arm, sx for about a week, has rash in same area, is a little stressed, denies fever/chills  O: vitals wnl, nad, skin with erythematous based lesions with dry vesicular rash, no weeping noted, lesions are in patches along dermatone, n/v intact  A: shingles  P: famvir, medrol dose, tramadol 50mg  q6h during the day, percocet 5/325 #10 1 po qhs if needed for pain

## 2017-02-01 DIAGNOSIS — G4733 Obstructive sleep apnea (adult) (pediatric): Secondary | ICD-10-CM | POA: Diagnosis not present

## 2017-03-04 DIAGNOSIS — G4733 Obstructive sleep apnea (adult) (pediatric): Secondary | ICD-10-CM | POA: Diagnosis not present

## 2017-04-03 DIAGNOSIS — G4733 Obstructive sleep apnea (adult) (pediatric): Secondary | ICD-10-CM | POA: Diagnosis not present

## 2017-04-04 NOTE — Progress Notes (Signed)
[place]  [phone] Fax:(336) [phone]  Clinic follow Up Note   Patient Care Team: Kim Ghent, MD as PCP - General (Family Medicine) Kim Merle, MD as Consulting Physician (Hematology) Kim Gibson, MD as Attending Physician (Radiation Oncology) Kim Bison Charlestine Massed, NP as Nurse Practitioner (Hematology and Oncology) Kim Klein, MD as Consulting Physician (General Surgery) 04/05/2017   CHIEF COMPLAINT:  Follow up left breast cancer   Oncology History   Breast cancer of upper-outer quadrant of left female breast [facility])   Staging form: Breast, AJCC 7th Edition   - Clinical stage from 02/10/2016: Stage 0 (Tis (DCIS), N0, M0) - Signed by Kim Merle, MD on 03/06/2016   - Pathologic: Stage IA (T79mc, N0, cM0) - Signed by SEppie Gibson MD on 04/04/2016      [place]   02/02/2016 Mammogram    Diagnostic mammogram showed 2 adjacent group of calcifications in the upper outer quadrant of left breast, suspicious for malignancy.      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 There is a small focus suspicious for early invasion.       02/10/2016 Receptors her2    ER negative, PR negative      03/19/2016 Surgery    Left breast lumpectomy      03/19/2016 Pathology Results    Left breast lumpectomy showed high-grade DCIS, 2.6 cm, with focal microinvasion (<0.1cm), margins were negative      03/19/2016 Receptors her2    ER negative, PR negative, HER-2 positive, with ratio 2.76 and copy # 15.3       04/27/2016 - 06/07/2016 Radiation Therapy    04/19/16 - 06/07/16  Site/dose:   Left Breast treated to 50.4 Gy in 28 fractions.                      Left Breast boosted to an additional 10 Gy in 5 fractions.       HISTORY OF PRESENTING ILLNESS (03/06/2016):    CSheppard Coil89y.o. female is here because of Her recently diagnosed left breast DCIS. She presents to my clinic to herself today.  This was discovered by screening mammogram. She had no palpable mass, skin change or nipple discharge. She denies any new symptoms. Her mammogram on 02/02/2016 showed 2 adjacent group of calcifications in the upper outer quadrant of left breast, suspicious for malignancy. She underwent core needle biopsy of the left breast lesion, which showed high-grade DCIS. ER and PR negative.  Patient had no prior breast biopsy or abnormal mammogram. 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 She is single, lives alone, had no pregnancy. Family history was negative for breast cancer, but her mother and brother had colon cancer. She is post menopause.  GYN HISTORY  Menarchal: 16 LMP: 50  Contraceptive: 27 years HRT: no  G0P0:  CURRENT THERAPY: Surveillance  INTERIM HISTORY:  CSpencerreturns for follow-up of her breast cancer. She was last seen by me 6 months ago. She presents to the clinic today noting she is doing well. Her last mammogram was negative. She notes having occasional shooting pain in her breast area from surgery.  She was taking prednisone due to her previous shingles episode on her arm. This  was stressed induced when she was thinking about her breast exam/mammoram. She shared her concern of breast cancer recurrence.  She notes her cholesterol has been high. She has not had any imaging of her liver.    MEDICAL HISTORY:  Past Medical History:  Diagnosis Date  . Breast cancer ([place])   . Colon polyp   . Heart murmur    as a child  . History of radiation therapy 04/19/16- 06/07/16   Left Breast/ 50.4 Gy in 28 fractions, Left Breast boosted/ 10 Gy in 5 fractions  . Hyperlipidemia   . Hypertension   . Menopause   . Personal history of chemotherapy   . Personal history of radiation therapy   . Tuberculosis    in  childhood, treated.     SURGICAL HISTORY: Past Surgical History:  Procedure Laterality Date  . BREAST LUMPECTOMY Left 03/19/2016   Procedure: SEED LOCALIZED LEFT BREAST LUMPECTOMY;  Surgeon: Kim Klein, MD;  Location: [facility];  Service: General;  Laterality: Left;  . COLONOSCOPY    . MASS EXCISION  54/21/2013   Procedure: EXCISION MASS;  Surgeon: Kim Klein, MD;  Location: [facility];  Service: General;  Laterality: Right;  Excision of Right Axillary Mass   . POLYPECTOMY      SOCIAL HISTORY: Social History   Social History  . Marital status: Single    Spouse name: N/A  . Number of children: N/A  . Years of education: N/A   Occupational History  . Not on file.   Social History Main Topics  . Smoking status: Never Smoker  . Smokeless tobacco: Never Used  . Alcohol use 0.0 oz/week     Comment: occassionally  . Drug use: No  . Sexual activity: Yes    Birth control/ protection: Post-menopausal   Other Topics Concern  . 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 Colon cancer Mother 46  . Lung cancer Father 3  . Seizures Brother   . Colon cancer Brother 23  . Diabetes Paternal Uncle   . Asthma Other   . Esophageal cancer Neg Hx   . Stomach cancer Neg Hx   . Breast cancer Neg Hx     ALLERGIES:  is allergic to latex.  MEDICATIONS:  Current Outpatient Prescriptions  Medication Sig Dispense Refill  . Cholecalciferol (VITAMIN D3) 2000 units capsule Take 2,000 Units by mouth daily.    . hydrochlorothiazide (HYDRODIURIL) 25 MG tablet Take 1 tablet (25 mg total) by mouth daily. 30 tablet 11  . Multiple Vitamins-Minerals (HAIR/SKIN/NAILS/BIOTIN) TABS Take 5,000 mcg by mouth every morning.     Current Facility-Administered Medications  Medication Dose Route Frequency Provider Last Rate Last Dose  . 0.9 %  sodium chloride infusion  500 mL Intravenous Continuous  Kim Banister, MD        REVIEW OF SYSTEMS:   Constitutional: Denies fevers, chills or abnormal night sweats Eyes: Denies blurriness of vision, double vision or watery eyes Ears, nose, mouth, throat, and face: Denies mucositis or sore throat Respiratory: Denies cough, dyspnea or wheezes Cardiovascular: Denies palpitation, chest discomfort or lower extremity swelling Gastrointestinal:  Denies nausea, heartburn or change in bowel habits Skin: Denies abnormal skin rashes Lymphatics: Denies new lymphadenopathy or easy bruising Neurological:Denies numbness, tingling or new weaknesses BREAST (+) Occasional shooting pain from surgery Behavioral/Psych: Mood is stable, no new changes  All other systems were reviewed with the patient and are negative.  PHYSICAL EXAMINATION: ECOG PERFORMANCE STATUS: 0 - Asymptomatic  Vitals:   04/05/17 0954  BP: (!) 123/55  Pulse: 78  Resp: 20  Temp: 98 F (36.7 C)  SpO2: 100%   Filed Weights   04/05/17 0954  Weight: 277 lb 1.6 oz (125.7 kg)    GENERAL:alert, no distress and comfortable SKIN: skin color, texture, turgor are normal, no rashes or significant lesions EYES: normal, conjunctiva are pink and non-injected, sclera clear OROPHARYNX:no exudate, no erythema and lips, buccal mucosa, and tongue normal  NECK: supple, thyroid normal size, non-tender, without nodularity LYMPH:  no palpable lymphadenopathy in the cervical, axillary or inguinal LUNGS: clear to auscultation and percussion with normal breathing effort HEART: regular rate & rhythm and no murmurs and no lower extremity edema ABDOMEN:abdomen soft, non-tender and normal bowel sounds Musculoskeletal:no cyanosis of digits and no clubbing  PSYCH: alert & oriented x 3 with fluent speech NEURO: no focal motor/sensory deficits Breasts: Breast inspection showed them to be symmetrical with no nipple discharge. (+) The surgical incision in the left breast healed well with no significant scar.  Palpation of the breasts and axilla revealed no obvious mass that I could appreciate.   LABORATORY DATA:  I have reviewed the data as listed CBC Latest Ref Rng & Units 04/05/2017 10/05/2016 12/15/2014  WBC 3.9 - 10.3 10e3/uL 6.3 6.2 8.2  Hemoglobin 11.6 - 15.9 g/dL 12.8 12.8 13.0  Hematocrit 34.8 - 46.6 % 38.4 38.7 39.3  Platelets 145 - 400 10e3/uL 286 275 322.0   CMP Latest Ref Rng & Units 04/05/2017 10/05/2016 03/16/2016  Glucose 70 - 140 mg/dl 114 140 102(H)  BUN 7.0 - 26.0 mg/dL 14.2 19.4 17  Creatinine 0.6 - 1.1 mg/dL 0.9 0.8 0.85  Sodium 136 - 145 mEq/L 141 142 141  Potassium 3.5 - 5.1 mEq/L 3.4(L) 4.0 3.6  Chloride 101 - 111 mmol/L - - 102  CO2 22 - 29 mEq/L '28 29 29  ' Calcium 8.4 - 10.4 mg/dL 9.5 10.3 9.4  Total Protein 6.4 - 8.3 g/dL 7.1 7.4 -  Total Bilirubin 0.20 - 1.20 mg/dL 0.62 0.60 -  Alkaline Phos 40 - 150 U/L 72 82 -  AST 5 - 34 U/L 47(H) 28 -  ALT 0 - 55 U/L 61(H) 37 -   PATHOLOGY REPORT  Diagnosis 02/10/2016 Breast, left, needle core biopsy, upper outer quadrant - 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 Microscopic Comment There is a small focus suspicious for early invasion. Prognostic markers will be ordered. Dr. Lyndon Code has reviewed the case. The case was called to [facility] on 02/14/2016.  Results: IMMUNOHISTOCHEMICAL AND MORPHOMETRIC ANALYSIS PERFORMED MANUALLY Estrogen Receptor: 0%, NEGATIVE Progesterone Receptor: 0%, NEGATIVE  Diagnosis 03/19/2016 Breast, lumpectomy, Left - HIGH GRADE DUCTAL CARCINOMA IN SITU (2.6 CM) WITH FOCAL MICROINVASION (<0.1 CM). - RESECTION MARGINS ARE NEGATIVE FOR CARCINOMA. - BIOPSY SITE. - FIBROCYSTIC CHANGE. - SEE ONCOLOGY TABLE. Microscopic Comment BREAST, INVASIVE TUMOR, WITHOUT LYMPH NODES PRESENT Specimen, including laterality : Left breast lump. Procedure: Left breast lumpectomy. Histologic type: Microinvasive ductal carcinoma. Grade: 3 Tubule formation:  3 Nuclear pleomorphism: 3 Mitotic: 2 Tumor size (glass slide measurement): <0.1 cm Margins: Invasive, distance to closest margin: >0.5 cm. In-situ, distance to closest margin: >0.5 cm. If margin positive, focally or broadly: N/A. Lymphovascular invasion: Not identified. Ductal carcinoma in situ: Present. Grade: III Extensive intraductal component: Present. Lobular neoplasia: Not  identified. Tumor focality: Unifocal. Extent of tumor: Confined to breast parenchyma. Breast prognostic profile: Invasive focus is very limited, but markers will be attempted. Non-neoplastic breast: Fibrocystic change, biopsy site. TNM: pT58m, pNX Comments: The majority of the lesion is high grade ductal carcinoma in situ. There is a single small (<0.1 cm) focus of invasion, confirmed to lack basal cell markers (p63, calponin, smooth muscle myosin). 2 of 4 FINAL for FADRA, SHEPLER(([id number] JVicente MalesMD Pathologist, Electronic Signature (Case signed 03/22/2016) Specimen Gross and Clinical Information PROGNOSTIC INDICATORS Results: IMMUNOHISTOCHEMICAL AND MORPHOMETRIC ANALYSIS PERFORMED MANUALLY Estrogen Receptor: 0%, NEGATIVE Progesterone Receptor: 0%, NEGATIVE Proliferation Marker Ki67: 15% Results: HER2 - **POSITIVE** RATIO OF HER2/CEP17 SIGNALS 2.76 AVERAGE HER2 COPY NUMBER PER CELL 15.30   RADIOGRAPHIC STUDIES: I have personally reviewed the radiological images as listed and agreed with the findings in the report. No results found.   Diagnostic Mammogram Bilateral 01/25/17 IMPRESSION: No evidence of malignancy in either breast. Lumpectomy changes in the left breast. RECOMMENDATION: Diagnostic mammogram is suggested in 1 year.   SCREENING MAMMOGRAM 01/23/2016 FINDINGS: In the left breast, calcifications warrant further evaluation. In the right breast, no findings suspicious for malignancy. Images were processed with CAD.  IMPRESSION: Further evaluation is suggested for  calcifications in the left breast.  RECOMMENDATION: Diagnostic mammogram of the left breast. (Code:FI-L-61M)  ASSESSMENT & PLAN:  56y.o. 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   1. 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 -She has a high-grade DCIS, with focal microinvasion (<0.1CM), ER/PR negative and HER-2 positive. Her surgical margins were negative. -I discussed with Dr. BBarry Dienes giving her the small focus of microinvasion, we do not feel she needs a second surgery for sentinel lymph node biopsy. But close follow-up would be reasonable, I plan to repeat ultrasound with her next mammogram to evaluate her axilla adenopathy. -She would not need adjuvant chemotherapy for her focally positive for invasive carcinoma.  -Given her negative ER and PR in tumor, she would not benefit from adjuvant antiestrogen therapy. -She would certainly benefit from breast radiation, she was seen by a radiation oncologist Dr. SIsidore Moos -The patient had adjuvant radiation from 04/19/16 - 06/07/16. The patient has been released by radiation oncology. -We discussed breast cancer surveillance after she completes treatment, Including annual mammogram, breast exam every 6 months and lab every 6 months, for 5 years or more  -Labs reviewed and discussed her fluctuating mild elevated liver enzymes, AST and ALT. I explained this can be related to her higher cholesterol and her obesity which can cause fatty liver. I suggest she follows up with her PCP to control her cholesterol and manage her weight. If her liver enzymes remain high she should get a scan of her liver.  -Otherwise is she is clinically doing well, her other CBC and CMP are within normal limits. Breast Exam unremarkable and 01/2017 mammogram was normal. There is no clinical concenr for recurrence.    -I encouraged her to get regular breast and pelvic exams with her GYN.    2. HTN, obesity  -She will follow-up with her primary care physician  3. Mild intermittent transaminitis -Her liver enzymes was slightly elevated in 2014. He has been intermittently elevated since then. She is asymmetric. -Her physical exam was negative.  -Likely fatty liver from her dyslipidemia and obesity. I encouraged her to have healthy diet and exercise,  try to lose weight, and follow-up her liver function with her primary care physician  PLAN -Lab and f/u in one year     No orders of the defined types were placed in this encounter.   All questions were answered. The patient knows to call the clinic with any problems, questions or concerns. I spent 15 minutes counseling the patient face to face. The total time spent in the appointment was 20 minutes and more than 50% was on counseling.     Kim Merle, MD 04/05/2017   This document serves as a record of services personally performed by Kim Merle, MD. It was created on her behalf by Joslyn Devon, a trained medical scribe. The creation of this record is based on the scribe's personal observations and the provider's statements to them. This document has been checked and approved by the attending provider.

## 2017-04-05 ENCOUNTER — Telehealth: Payer: Self-pay | Admitting: Hematology

## 2017-04-05 ENCOUNTER — Ambulatory Visit (HOSPITAL_BASED_OUTPATIENT_CLINIC_OR_DEPARTMENT_OTHER): Payer: 59 | Admitting: Hematology

## 2017-04-05 ENCOUNTER — Other Ambulatory Visit (HOSPITAL_BASED_OUTPATIENT_CLINIC_OR_DEPARTMENT_OTHER): Payer: 59

## 2017-04-05 VITALS — BP 123/55 | HR 78 | Temp 98.0°F | Resp 20 | Ht 66.0 in | Wt 277.1 lb

## 2017-04-05 DIAGNOSIS — R740 Nonspecific elevation of levels of transaminase and lactic acid dehydrogenase [LDH]: Secondary | ICD-10-CM

## 2017-04-05 DIAGNOSIS — Z86000 Personal history of in-situ neoplasm of breast: Secondary | ICD-10-CM

## 2017-04-05 DIAGNOSIS — E669 Obesity, unspecified: Secondary | ICD-10-CM | POA: Diagnosis not present

## 2017-04-05 DIAGNOSIS — E7800 Pure hypercholesterolemia, unspecified: Secondary | ICD-10-CM

## 2017-04-05 DIAGNOSIS — I10 Essential (primary) hypertension: Secondary | ICD-10-CM

## 2017-04-05 DIAGNOSIS — C50412 Malignant neoplasm of upper-outer quadrant of left female breast: Secondary | ICD-10-CM

## 2017-04-05 DIAGNOSIS — Z171 Estrogen receptor negative status [ER-]: Principal | ICD-10-CM

## 2017-04-05 LAB — CBC WITH DIFFERENTIAL/PLATELET
BASO%: 0.2 % (ref 0.0–2.0)
BASOS ABS: 0 10*3/uL (ref 0.0–0.1)
EOS%: 1 % (ref 0.0–7.0)
Eosinophils Absolute: 0.1 10*3/uL (ref 0.0–0.5)
HCT: 38.4 % (ref 34.8–46.6)
HEMOGLOBIN: 12.8 g/dL (ref 11.6–15.9)
LYMPH%: 51.3 % — ABNORMAL HIGH (ref 14.0–49.7)
MCH: 31.3 pg (ref 25.1–34.0)
MCHC: 33.3 g/dL (ref 31.5–36.0)
MCV: 93.9 fL (ref 79.5–101.0)
MONO#: 0.5 10*3/uL (ref 0.1–0.9)
MONO%: 7.3 % (ref 0.0–14.0)
NEUT#: 2.5 10*3/uL (ref 1.5–6.5)
NEUT%: 40.2 % (ref 38.4–76.8)
Platelets: 286 10*3/uL (ref 145–400)
RBC: 4.09 10*6/uL (ref 3.70–5.45)
RDW: 15.1 % — AB (ref 11.2–14.5)
WBC: 6.3 10*3/uL (ref 3.9–10.3)
lymph#: 3.2 10*3/uL (ref 0.9–3.3)

## 2017-04-05 LAB — COMPREHENSIVE METABOLIC PANEL
ALBUMIN: 3.7 g/dL (ref 3.5–5.0)
ALK PHOS: 72 U/L (ref 40–150)
ALT: 61 U/L — ABNORMAL HIGH (ref 0–55)
AST: 47 U/L — AB (ref 5–34)
Anion Gap: 10 mEq/L (ref 3–11)
BUN: 14.2 mg/dL (ref 7.0–26.0)
CHLORIDE: 103 meq/L (ref 98–109)
CO2: 28 meq/L (ref 22–29)
Calcium: 9.5 mg/dL (ref 8.4–10.4)
Creatinine: 0.9 mg/dL (ref 0.6–1.1)
EGFR: >60 mL/min/{1.73_m2} (ref >60)
GLUCOSE: 114 mg/dL (ref 70–140)
POTASSIUM: 3.4 meq/L — AB (ref 3.5–5.1)
SODIUM: 141 meq/L (ref 136–145)
Total Bilirubin: 0.62 mg/dL (ref 0.20–1.20)
Total Protein: 7.1 g/dL (ref 6.4–8.3)

## 2017-04-05 NOTE — Telephone Encounter (Signed)
Gave avs and calendar for November 2019 °

## 2017-04-07 ENCOUNTER — Telehealth: Payer: Self-pay | Admitting: Family Medicine

## 2017-04-07 ENCOUNTER — Encounter: Payer: Self-pay | Admitting: Hematology

## 2017-04-07 NOTE — Telephone Encounter (Signed)
Call pt.  Needs f/u re: BP and LFTs and weight.  Thanks.

## 2017-04-08 NOTE — Telephone Encounter (Signed)
Patient advised. Appointment scheduled.  

## 2017-04-15 ENCOUNTER — Encounter: Payer: Self-pay | Admitting: Family Medicine

## 2017-04-15 ENCOUNTER — Ambulatory Visit (INDEPENDENT_AMBULATORY_CARE_PROVIDER_SITE_OTHER): Payer: 59 | Admitting: Family Medicine

## 2017-04-15 VITALS — BP 132/84 | HR 79 | Temp 98.4°F | Wt 280.2 lb

## 2017-04-15 DIAGNOSIS — R945 Abnormal results of liver function studies: Secondary | ICD-10-CM | POA: Diagnosis not present

## 2017-04-15 DIAGNOSIS — R739 Hyperglycemia, unspecified: Secondary | ICD-10-CM

## 2017-04-15 DIAGNOSIS — I10 Essential (primary) hypertension: Secondary | ICD-10-CM | POA: Diagnosis not present

## 2017-04-15 DIAGNOSIS — R7989 Other specified abnormal findings of blood chemistry: Secondary | ICD-10-CM

## 2017-04-15 MED ORDER — POTASSIUM CHLORIDE ER 10 MEQ PO TBCR: 10.0000 meq | EXTENDED_RELEASE_TABLET | Freq: Every day | ORAL | 3 refills | Status: DC

## 2017-04-15 MED ORDER — HYDROCHLOROTHIAZIDE 25 MG PO TABS: 25.0000 mg | ORAL_TABLET | Freq: Every day | ORAL | 3 refills | Status: DC

## 2017-04-15 NOTE — Patient Instructions (Addendum)
Kim Roberts will call about your referral. [email or web address] Think about what you want to do for exercise.  See how second shift goes.   Take care.  Glad to see you.

## 2017-04-15 NOTE — Progress Notes (Signed)
LFT elevation d/w pt.  Rare h/o NAV, not in the last few weeks.  No illness recently.  No h/o blood transfusion.  No IVDU.  No high risk exposure.    She is moving to 2nd shift soon.  She enjoys starches in her diet, d/w pt.  D/w pt about exercise.   She wanted to talk to nutrition.  D/w pt.  Plan for diet/nutrition first.  Options such as bariatric surgery discussed with patient.  Hypertension:    Using medication without problems or lightheadedness: yes Chest pain with exertion:no Edema:no Short of breath:no  Meds, vitals, and allergies reviewed.   PMH and SH reviewed  ROS: Per HPI unless specifically indicated in ROS section   GEN: nad, alert and oriented HEENT: mucous membranes moist NECK: supple w/o LA CV: rrr. PULM: ctab, no inc wob ABD: soft, +bs EXT: no edema

## 2017-04-16 IMAGING — MG NEEDLE LOCALIZATION OF THE LEFT BREAST WITH MAMMO GUIDANCE
8 of 10 series · 8 of 10 positions shown · non-contrast
Comparison: Previous exam(s).

CLINICAL DATA: 55-year-old female with recently diagnosed ductal
carcinoma in situ post stereotactic guided biopsy of calcifications
spanning a distance of approximately 2.5 cm in the upper-outer left
breast.

EXAM:
MAMMOGRAPHIC GUIDED RADIOACTIVE SEED LOCALIZATION OF THE LEFT BREAST

[L LM (1 of 3)]
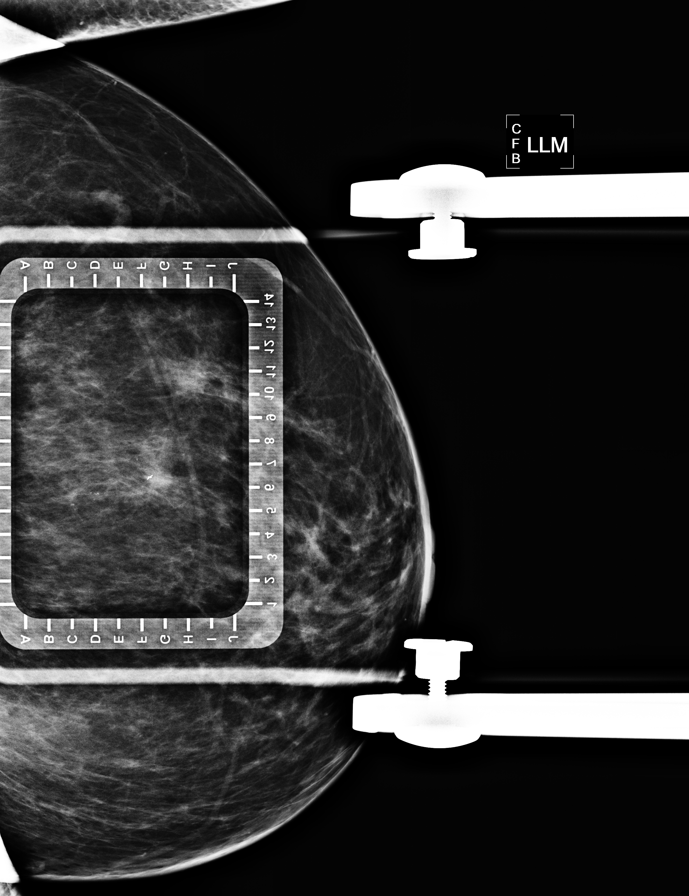

[L LM (2 of 3)]
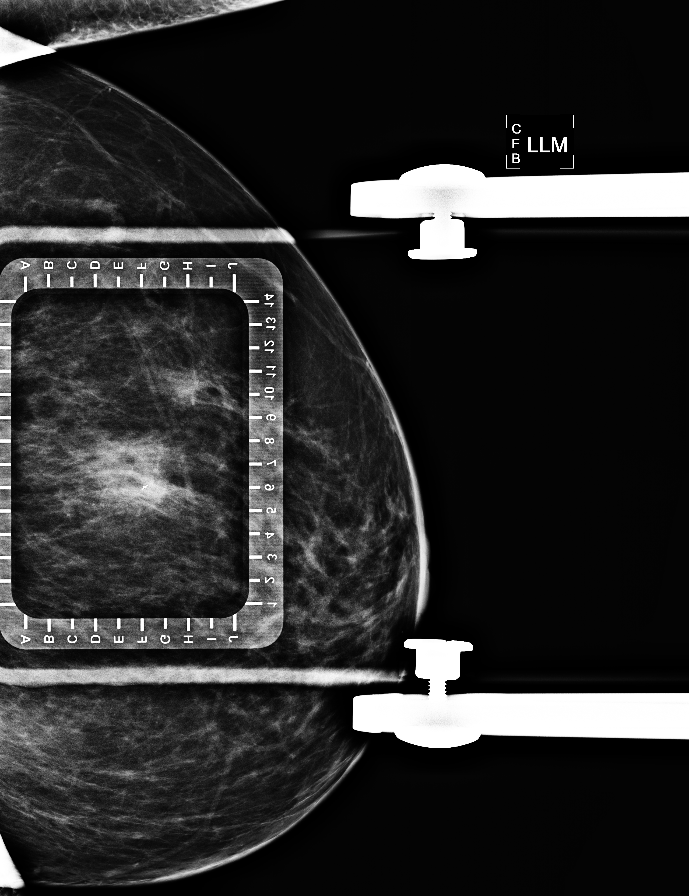

[L CC (1 of 4)]
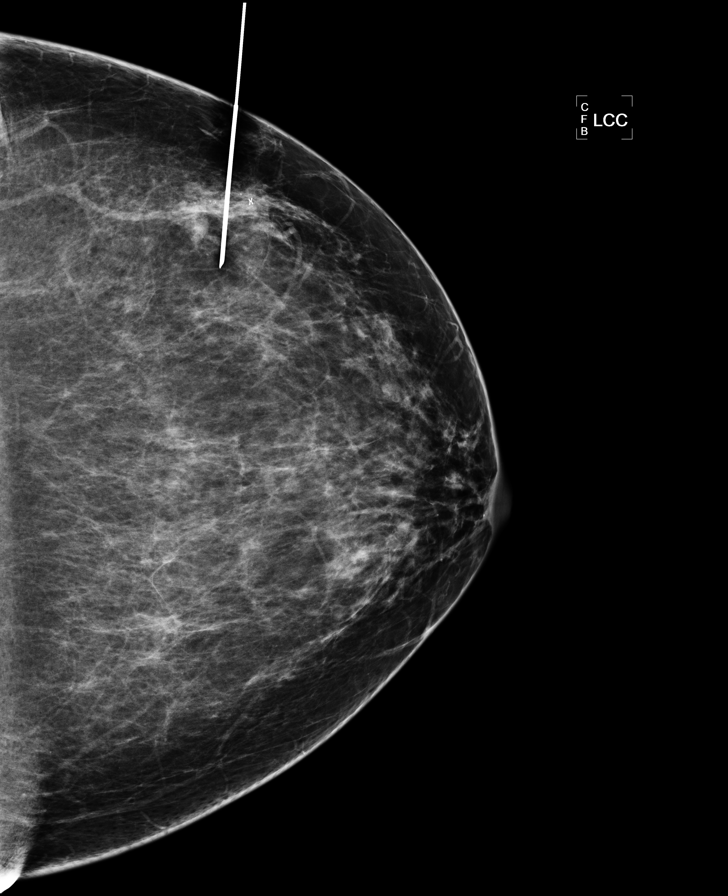

[L CC (2 of 4)]
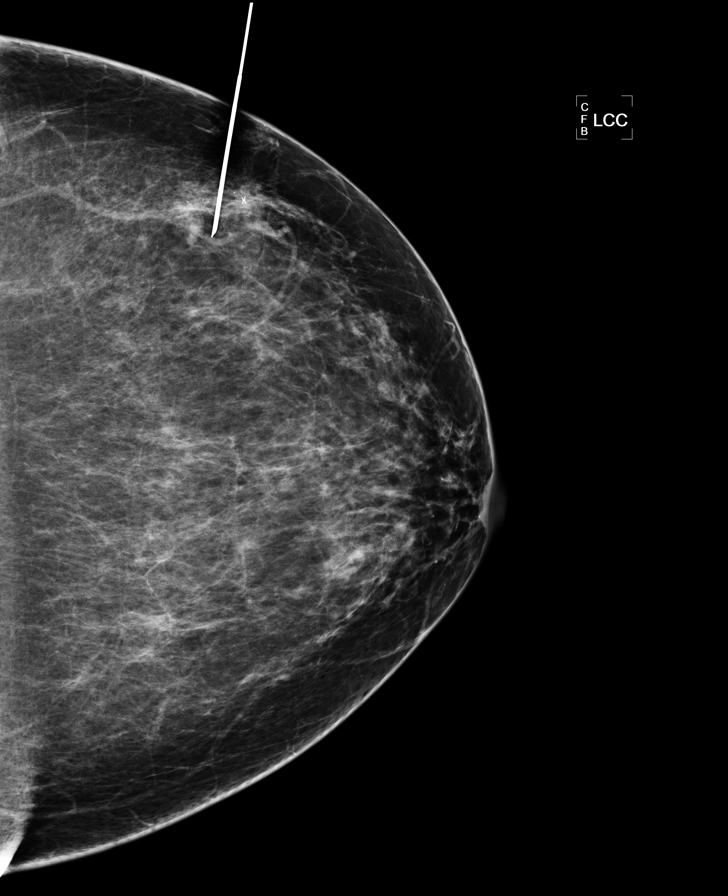

[L CC (3 of 4)]
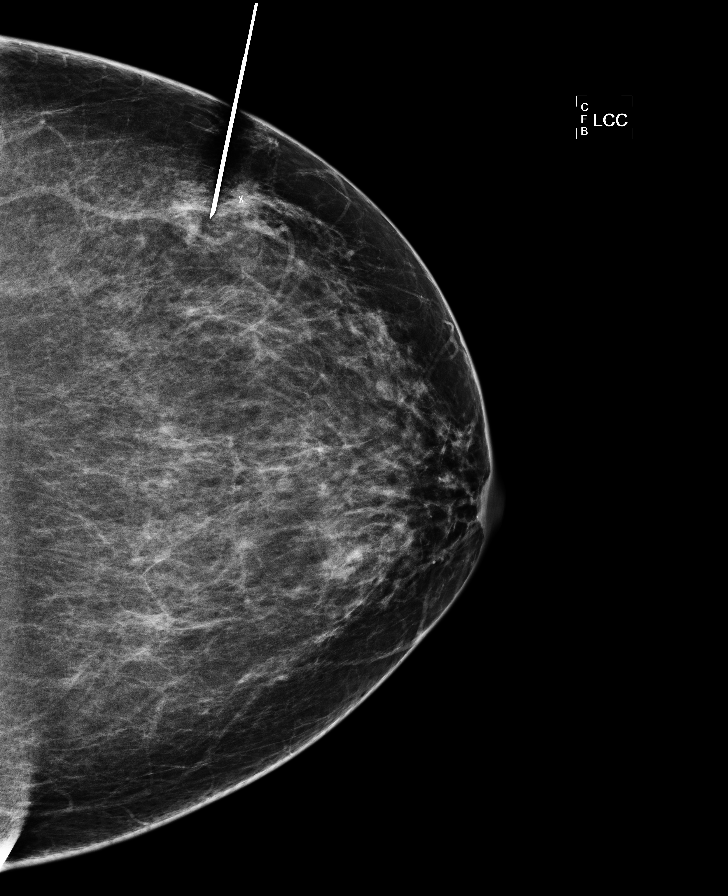

[L LM (3 of 3)]
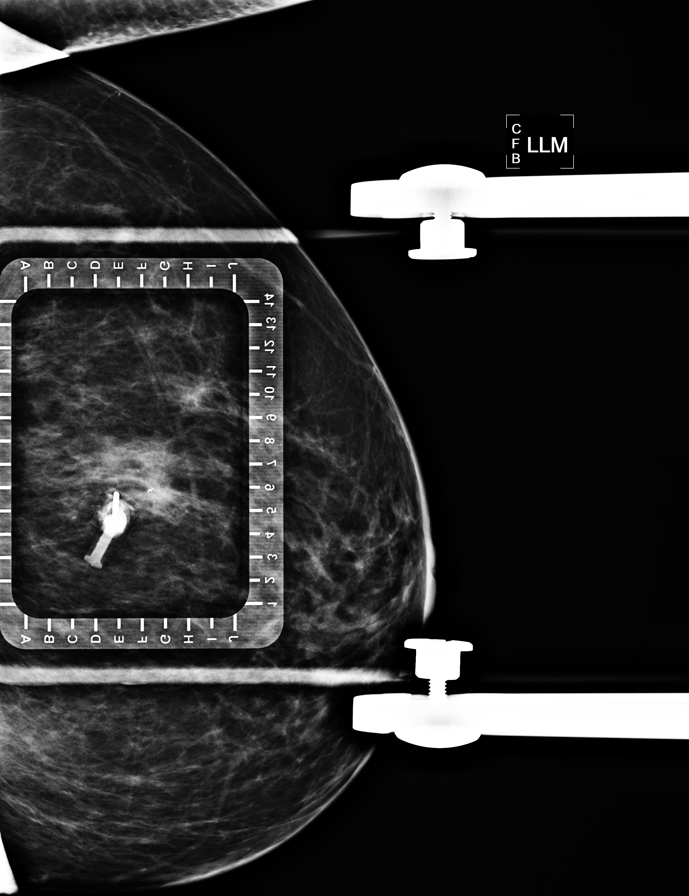

[L CC (4 of 4)]
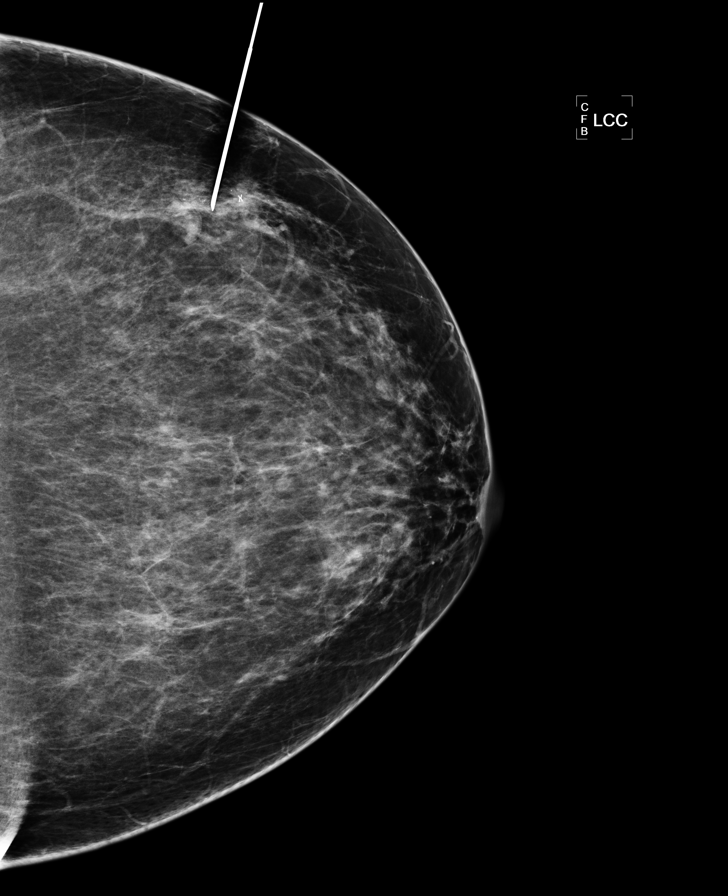

[L ML]
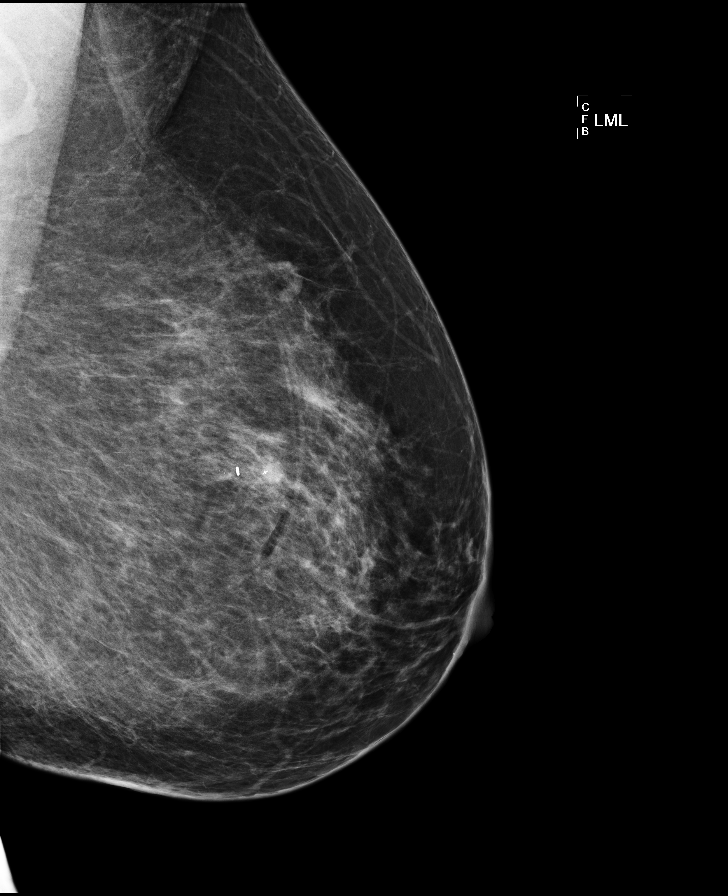

[8 of 10 positions shown; findings below may reference images not displayed]

FINDINGS: Patient presents for radioactive seed localization prior to left
breast lumpectomy. I met with the patient and we discussed the
procedure of seed localization including benefits and alternatives.
We discussed the high likelihood of a successful procedure. We
discussed the risks of the procedure including infection, bleeding,
tissue injury and further surgery. We discussed the low dose of
radioactivity involved in the procedure. Informed, written consent
was given.

The usual time-out protocol was performed immediately prior to the
procedure.

Using mammographic guidance, sterile technique, 1% lidocaine and an
M-0IK radioactive seed, the calcifications with associated X shaped
biopsy marking clip was localized using a lateral to medial
approach. The follow-up mammogram images confirm the seed in the
expected location and were marked for Dr. Kamran.

Follow-up survey of the patient confirms presence of the radioactive
seed.

Order number of M-0IK seed:  [id number].

Total activity:  0.249 millicuries  Reference Date: 03/07/2016

The patient tolerated the procedure well and was released from the
[REDACTED]. She was given instructions regarding seed removal.
IMPRESSION: Radioactive seed localization left breast. The radioactive seed is
placed in the center of the linear oriented calcifications in the
upper-outer left breast which span a distance of approximately
cm.

## 2017-04-17 DIAGNOSIS — R7989 Other specified abnormal findings of blood chemistry: Secondary | ICD-10-CM | POA: Insufficient documentation

## 2017-04-17 DIAGNOSIS — R945 Abnormal results of liver function studies: Principal | ICD-10-CM

## 2017-04-17 NOTE — Assessment & Plan Note (Signed)
Continue hydrochlorothiazide.  Add on potassium.  Return for routine labs.  Discussed with patient.  She agrees.

## 2017-04-17 NOTE — Assessment & Plan Note (Signed)
Likely from fatty liver.  Discussed with patient about diet and exercise.  Get routine labs done along with liver ultrasound.  Refer to nutrition for counseling.  She is moving to second shift and she will try to work exercise into her daily routine.  Previous labs discussed with patient.  rationale for w/u discussed with patient.  She agrees. >25 minutes spent in face to face time with patient, >50% spent in counselling or coordination of care.

## 2017-04-19 IMAGING — MG BREAST SURGICAL SPECIMEN
1 series · 1 of 1 positions shown · non-contrast
Comparison: Previous exam(s).

CLINICAL DATA: Ductal carcinoma in situ of the left breast.
Radioactive seed localization was performed 03/16/2016. Patient
underwent lumpectomy today.

EXAM:
SPECIMEN RADIOGRAPH OF THE LEFT BREAST

[L]
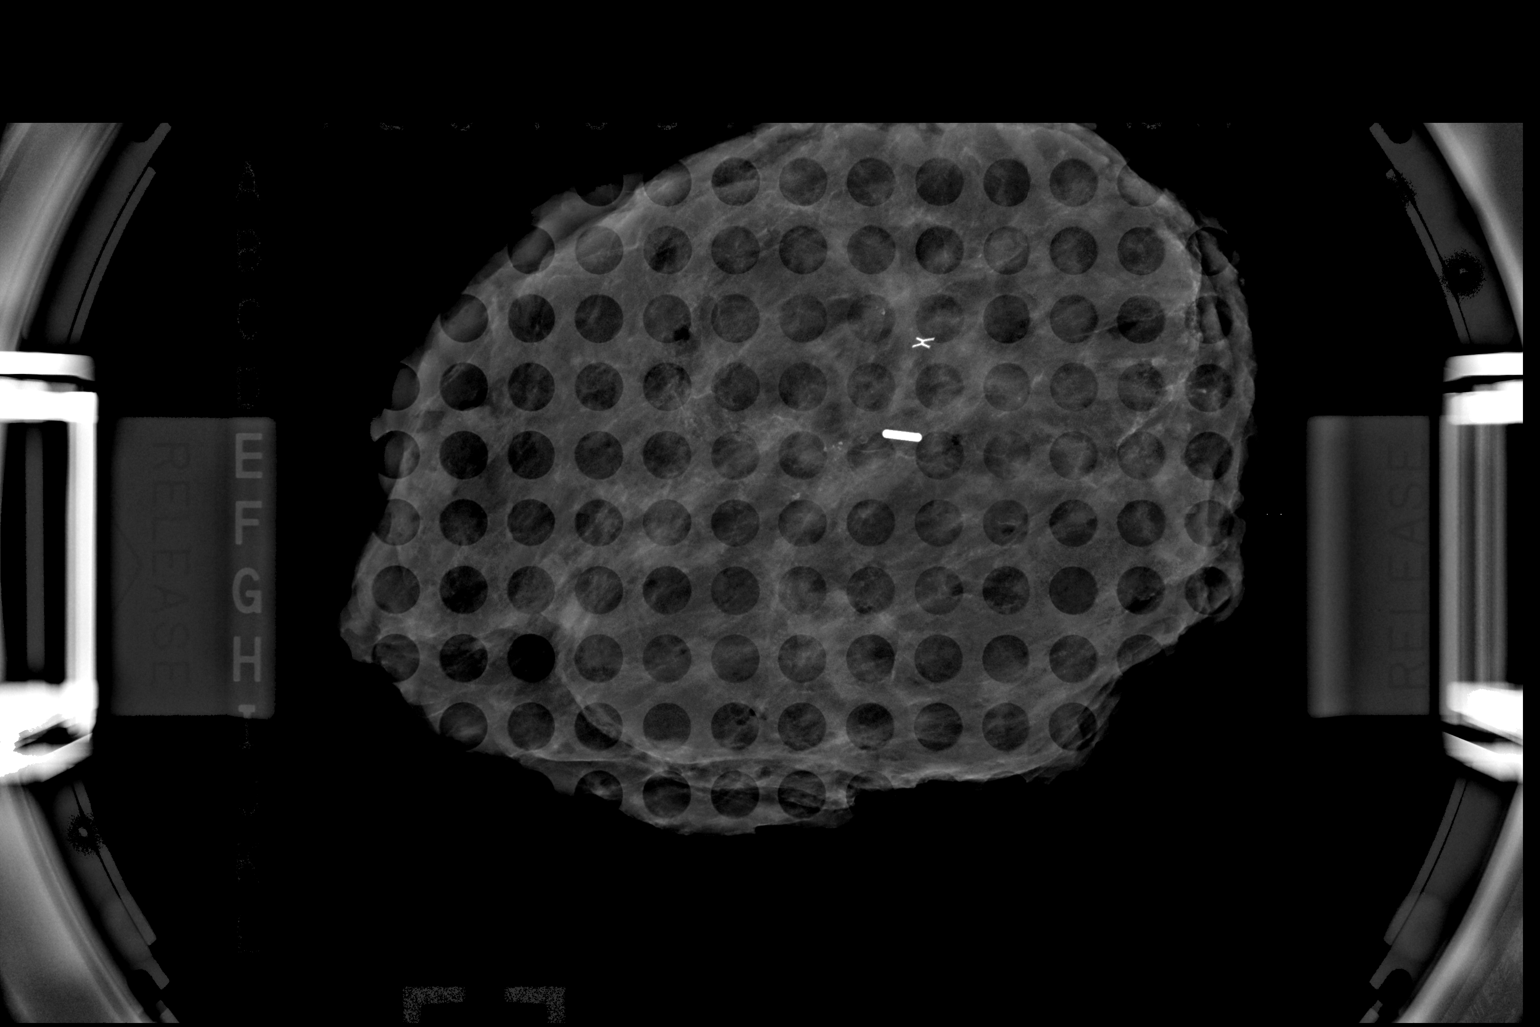

[1 of 1 positions shown; findings below may reference images not displayed]

FINDINGS: Status post excision of the left breast. The radioactive seed and
biopsy marker clip are present, completely intact, and were marked
for pathology.
IMPRESSION: Specimen radiograph of the left breast.

## 2017-04-22 ENCOUNTER — Other Ambulatory Visit (INDEPENDENT_AMBULATORY_CARE_PROVIDER_SITE_OTHER): Payer: 59

## 2017-04-22 DIAGNOSIS — R7989 Other specified abnormal findings of blood chemistry: Secondary | ICD-10-CM

## 2017-04-22 DIAGNOSIS — R739 Hyperglycemia, unspecified: Secondary | ICD-10-CM

## 2017-04-22 DIAGNOSIS — R945 Abnormal results of liver function studies: Secondary | ICD-10-CM | POA: Diagnosis not present

## 2017-04-22 LAB — BASIC METABOLIC PANEL
BUN: 13 mg/dL (ref 6–23)
CO2: 29 mEq/L (ref 19–32)
Calcium: 9.6 mg/dL (ref 8.4–10.5)
Chloride: 102 mEq/L (ref 96–112)
Creatinine, Ser: 0.79 mg/dL (ref 0.40–1.20)
GFR: 97.43 mL/min (ref >60.00)
GLUCOSE: 128 mg/dL — AB (ref 70–99)
POTASSIUM: 3.7 meq/L (ref 3.5–5.1)
SODIUM: 140 meq/L (ref 135–145)

## 2017-04-22 LAB — LIPID PANEL
CHOLESTEROL: 194 mg/dL (ref 0–200)
HDL: 44 mg/dL (ref >39.00)
LDL Cholesterol: 117 mg/dL — ABNORMAL HIGH (ref 0–99)
NonHDL: 150
Total CHOL/HDL Ratio: 4
Triglycerides: 164 mg/dL — ABNORMAL HIGH (ref 0.0–149.0)
VLDL: 32.8 mg/dL (ref 0.0–40.0)

## 2017-04-22 LAB — HEMOGLOBIN A1C: Hgb A1c MFr Bld: 6.8 % — ABNORMAL HIGH (ref 4.6–6.5)

## 2017-04-23 ENCOUNTER — Ambulatory Visit
Admission: RE | Admit: 2017-04-23 | Discharge: 2017-04-23 | Disposition: A | Discharge status: Home / Self Care | Payer: 59 | Source: Ambulatory Visit | Attending: Family Medicine | Admitting: Family Medicine

## 2017-04-23 DIAGNOSIS — R7989 Other specified abnormal findings of blood chemistry: Secondary | ICD-10-CM

## 2017-04-23 DIAGNOSIS — K7689 Other specified diseases of liver: Secondary | ICD-10-CM | POA: Diagnosis not present

## 2017-04-23 DIAGNOSIS — R945 Abnormal results of liver function studies: Principal | ICD-10-CM

## 2017-04-23 LAB — HEPATITIS PANEL, ACUTE
HEP B C IGM: NONREACTIVE
HEP C AB: NONREACTIVE
Hep A IgM: NONREACTIVE
Hepatitis B Surface Ag: NONREACTIVE
SIGNAL TO CUT-OFF: 0 (ref <1.00)

## 2017-04-26 ENCOUNTER — Encounter: Payer: Self-pay | Admitting: *Deleted

## 2017-04-26 ENCOUNTER — Encounter: Payer: Self-pay | Admitting: Family Medicine

## 2017-04-26 DIAGNOSIS — E119 Type 2 diabetes mellitus without complications: Secondary | ICD-10-CM | POA: Insufficient documentation

## 2017-05-04 DIAGNOSIS — G4733 Obstructive sleep apnea (adult) (pediatric): Secondary | ICD-10-CM | POA: Diagnosis not present

## 2017-05-07 ENCOUNTER — Encounter: Payer: Self-pay | Admitting: Dietician

## 2017-05-07 ENCOUNTER — Encounter: Payer: 59 | Attending: Family Medicine | Admitting: Dietician

## 2017-05-07 DIAGNOSIS — R945 Abnormal results of liver function studies: Secondary | ICD-10-CM | POA: Insufficient documentation

## 2017-05-07 DIAGNOSIS — E663 Overweight: Secondary | ICD-10-CM | POA: Diagnosis not present

## 2017-05-07 DIAGNOSIS — Z713 Dietary counseling and surveillance: Secondary | ICD-10-CM | POA: Insufficient documentation

## 2017-05-07 DIAGNOSIS — R7989 Other specified abnormal findings of blood chemistry: Secondary | ICD-10-CM

## 2017-05-07 NOTE — Progress Notes (Signed)
Medical Nutrition Therapy:  Appt start time: 0930 end time:  1030.   Assessment:  Primary concerns today: Patient is here today alone.  She would like to learn to eat better.  She states that she does not know how to read labels.  She wants to know what foods contain carbohydrates, proteins, and fats to make healthier choices. Referral was for increased LFT's with concerns of fatty liver. Her A1C is also elevated 6.8% 04/22/17 and meets the criteria for Type 2 Diabetes.  Other labs include AST 47 and ALT 61 04/05/17.  History includes HTN and hyperlipidemia and a history of breast cancer treated with XRT last year.    Weight Hx: 277 lbs today 213 lbs lowest adult weight 280 lbs highest adult weight 3 weeks ago at MD office.  Patient lives alone.  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 She switched from third shift to second shift last week.  Preferred Learning Style:   No preference indicated   Learning Readiness:   Ready   MEDICATIONS: see list to include vitamin D, hair and nail vitamin, potassium.   DIETARY INTAKE:  Usual eating pattern includes 3 meals and 5 snacks per day. "snack all the time"  24-hr recall:  B (7-10 AM): cereal (fruit loops), milk, yogurt OR sausage, bacon, eggs, 2 slices toast with cream cheese  Snk ( AM): yogurt or none L (1-3 PM): out to eat - salad with shrimp OR 2 slices pizza OR sub OR spaghetti OR panera Snk ( PM): grapes, potato chips, popcorn, yogurt D (6-9 PM): spaghetti and buttered toast OR other similar to lunch Snk ( PM): yogurt or ice cream, chips, grapes, popcorn Beverages: regular gingerale or coke, water (not as much as I need), sweet tea, occasional juice  Usual physical activity:  tried to start back walking  "I don't want to let my doctor and myself down."  Progress Towards Goal(s):  In progress.   Nutritional Diagnosis:  NB-1.1 Food and nutrition-related knowledge deficit As related to healthy  eating .  As evidenced by diet hx and patient report.      Intervention:  Nutrition education/counseling related to mindful/healthy eating. Discussed sources of carbohydrates, proteins, fats, portion sizes, portions when eating out, and label reading.  Discussed benefits of more plant based eating and increase of exercise.  Discussed balance of carbohydrate, protein, fat and basic recommendations that would also effect her blood sugar.  Mindfulness  Choices  Eat slowly  Stop when you are satisfied  Before eating a snack ask, "Am I hungry or eating for another reason?"   If you are hungry, look at the portion size, put it in a bowl and enjoy without distraction.  Find ways to increase your vegetable intake.  Steamable frozen vegetables  Birdseye protein blend frozen vegetables  Meals should contain small amounts of protein (meat portion the size of the palm of your hand, beans, egg, nuts or peanut butter, small amounts of cheese, yogurt)   Rethink your beverages.  Drink more water.  Limit soda. Find ways to be more active (walking, gardening, the new gym)  Teaching Method Utilized:  Visual Auditory Hands on  Handouts given during visit include:  Label reading  Snack list  My plate  Meal plan card  Barriers to learning/adherence to lifestyle change: knowledge, motivation  Demonstrated degree of understanding via:  Teach Back   Monitoring/Evaluation:  Dietary intake, exercise, label reading, and body weight in 6 week(s).

## 2017-05-07 NOTE — Patient Instructions (Addendum)
Mindfulness  Choices  Eat slowly  Stop when you are satisfied  Before eating a snack ask, "Am I hungry or eating for another reason?"   If you are hungry, look at the portion size, put it in a bowl and enjoy without distraction.  Find ways to increase your vegetable intake.  Steamable frozen vegetables  Birdseye protein blend frozen vegetables  Meals should contain small amounts of protein (meat portion the size of the palm of your hand, beans, egg, nuts or peanut butter, small amounts of cheese, yogurt)   Rethink your beverages.  Drink more water.  Limit soda. Find ways to be more active (walking, gardening, the new gym)

## 2017-06-24 ENCOUNTER — Ambulatory Visit: Payer: Self-pay | Admitting: Dietician

## 2017-07-08 ENCOUNTER — Encounter: Payer: 59 | Attending: Family Medicine | Admitting: Dietician

## 2017-07-08 DIAGNOSIS — Z713 Dietary counseling and surveillance: Secondary | ICD-10-CM | POA: Diagnosis not present

## 2017-07-08 DIAGNOSIS — R945 Abnormal results of liver function studies: Secondary | ICD-10-CM | POA: Diagnosis not present

## 2017-07-08 DIAGNOSIS — E663 Overweight: Secondary | ICD-10-CM | POA: Diagnosis not present

## 2017-07-08 DIAGNOSIS — R7989 Other specified abnormal findings of blood chemistry: Secondary | ICD-10-CM

## 2017-07-08 DIAGNOSIS — E119 Type 2 diabetes mellitus without complications: Secondary | ICD-10-CM

## 2017-07-08 NOTE — Progress Notes (Signed)
  Medical Nutrition Therapy:  Appt start time: 0930 end time:  1010.   Assessment:  Primary concerns today: Patient is here today alone for follow up.  She stated at the last admit 05/07/17 that she would like to learn to eat better.  History includes type 2 diabetes, increased LFTs, HTN, and hyperlipidemia as well as breast cancer treated with XRT 2017..   Weight Hx: 281 lbs today increased from 277 lbs 05/07/17 213 lbs lowest adult weight 280 lbs highest adult weight 3 weeks ago at MD office.  Patient lives alone.  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  She switched from third shift to second in November and prefers this shift although her eating schedule is still a problem and she will snack most of the time and eat when se gets off work around 1 am. She states that she is moving to [place] in a couple of months to be closer to family.  She would like to date but is afraid that all the men want someone thinner than she is.  Preferred Learning Style:   No preference indicated   Learning Readiness:   Contemplating   MEDICATIONS: see list to include MVI, Potassium, Vitamin D   DIETARY INTAKE:  Usual eating pattern includes 2 meals and 2 snacks per day. 24-hr recall:  B (8-11 AM): cereal (fruit loops or bran cereal, milk and yogurt OR sausage, eggs, [employer] toast (dry) (home or [employer] or other restaurant). Was making a smoothie (banana, blueberries, protein powder, milk or carnation milk Snk ( AM): 1-2 yoplait lite yogurt  L ( PM): skips at times OR out to eat Snk ( PM): potato chips, cookies, popcorn D (8 PM): leftover if she bought lunch (grilled chicken/pitta or spaghetti) Snk ( PM): potato chips, french fries OR taco bell meal OR sweet potato, steak, salad   Beverages: water, flavored water, occasional regular soda, unsweetened tea, occasional juice  Usual physical activity: difficulty getting started  Estimated energy needs: 2000  calories 225 g carbohydrates 150 g protein 56 g fat  Progress Towards Goal(s):  In progress.   Nutritional Diagnosis:  NB-1.1 Food and nutrition-related knowledge deficit As related to balance of carbohydrate, protein and fat.  As evidenced by diet hx and patient report.    Intervention:  Nutrition counseling/education related to mindful and healthy eating continued.  Discussed tips for meal planning and meal timing.  Discussed benefits of exercise but focus today on normalizing her meal schedule to improve nutrition density. Doristine Devoid job on changing your beverages! Work at [employer] schedule Aim for [employer], [employer], [employer] daily. Snack when you are hungry Avoid eating for boredom, energy, or to make you happy. Eat slowly and stop when you are satisfied. Eat at the table. Read labels.   VRemover.[employer].ee (sample menus) Loss adjuster, [email or web address]  Diabetes.[email or web address] (meal planning) [email or web address]   Teaching Method Utilized:  Visual Auditory  Handouts given during visit include:  Label reading  My plate  Barriers to learning/adherence to lifestyle change: 2nd shift, motivation  Demonstrated degree of understanding via:  Teach Back   Monitoring/Evaluation:  Dietary intake, exercise, label reading, and body weight in 1 month(s).

## 2017-07-08 NOTE — Patient Instructions (Addendum)
Great job on changing your beverages! Work at [employer] schedule Aim for [employer], [employer], [employer] daily. Snack when you are hungry Avoid eating for boredom, energy, or to make you happy. Eat slowly and stop when you are satisfied. Eat at the table. Read labels.   VRemover.com.ee (sample menus) Loss adjuster, [email or web address]  [email or web address] (meal planning) [email or web address] 

## 2017-08-05 ENCOUNTER — Encounter: Payer: 59 | Attending: Family Medicine | Admitting: Dietician

## 2017-08-05 DIAGNOSIS — E119 Type 2 diabetes mellitus without complications: Secondary | ICD-10-CM

## 2017-08-05 DIAGNOSIS — Z713 Dietary counseling and surveillance: Secondary | ICD-10-CM | POA: Diagnosis not present

## 2017-08-05 DIAGNOSIS — R945 Abnormal results of liver function studies: Secondary | ICD-10-CM | POA: Diagnosis not present

## 2017-08-05 DIAGNOSIS — E663 Overweight: Secondary | ICD-10-CM | POA: Insufficient documentation

## 2017-08-05 NOTE — Patient Instructions (Signed)
Great job on the changes you have made!  Thinking about your meal choices (less fried, more salad)  Drinking more water  Consider getting your vitamin D checked or taking 2000 units of vitamin D daily Look at your schedule related to meal timing to avoid eating before bed. Healthy snack choices when hungry Read labels for fat and carbohydrate especially with foods eaten out and beverages. Continue to drink beverages without carbohydrates. Find ways to include more vegetables. Find ways to be more active.  Find ways to focus on your health.

## 2017-08-05 NOTE — Progress Notes (Signed)
  Medical Nutrition Therapy:  Appt start time: 1100 end time:  1135.   Assessment:  Primary concerns today: Patient is here today alone for follow up.  She would continue to try to learn to eat better but struggles with behavioral change.  History includes HTN, increased LFTs, type 2 diabetes, and hyperlipidemia as well as breast cancer treated with XRT 2017.  Since last admit, she has decreased fries and increased salads and has been drinking more water.  She struggles with eating after work around [employer] am.  Weight Hx: 278 lbs today 213 lbs lowest adult weight 280 lbs highest adult weight 3 weeks ago at MD office.   Patient lives alone.  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  She switched from third shift to second in November and prefers this shift although her eating schedule is still a problem and she will snack most of the time and eat when se gets off work around 1 am. She states that she is moving to [place] in a couple of months to be closer to family.  She would like to date but is afraid that all the men want someone thinner than she is.  Preferred Learning Style:   No preference indicated   Learning Readiness:   Ready  Change in progress   MEDICATIONS: see list to include vitamin D3, potassium, and MVI.   DIETARY INTAKE: Usual eating pattern includes 3-4 meals and 1 snacks per day. 24-hr recall:  B (8-10 am AM): scrambled eggs and 2 sausage and instant grits  Snk ( AM): none  L (1-4 PM): 6" subway (tuna or [place]) and baked chips Snk ( PM): nabs D ( PM): occasional grilled chicken and vegetables at work [employer] ( PM): often drive through at [employer] Beverages: water, flavored water, unsweetened tea  Usual physical activity: none  Estimated energy needs: 2000 calories 225 g carbohydrates 150 g protein 56 g fat  Progress Towards Goal(s):  In progress.   Nutritional Diagnosis:  NB-1.1 Food and  nutrition-related knowledge deficit As related to balance of carbohydrates, protein, and fat.  As evidenced by diet hx and patient report.    Intervention:  Nutrition counseling/education continued. Discussed meal timing and choices to help avoid the midnight meal which is higher in calories and fat.  Discussed ways to be more active and meal prepping options.  Great job on the changes you have made!  Thinking about your meal choices (less fried, more salad)  Drinking more water  Consider getting your vitamin D checked or taking 2000 units of vitamin D daily Look at your schedule related to meal timing to avoid eating before bed. Healthy snack choices when hungry Read labels for fat and carbohydrate especially with foods eaten out and beverages. Continue to drink beverages without carbohydrates. Find ways to include more vegetables. Find ways to be more active.  Find ways to focus on your health.  Teaching Method Utilized:  Auditory  Handouts given during visit include:  Barriers to learning/adherence to lifestyle change: motivation  Demonstrated degree of understanding via:  Teach Back   Monitoring/Evaluation:  Dietary intake, exercise, label reading, and body weight in 2 month(s).

## 2017-08-06 ENCOUNTER — Encounter: Payer: Self-pay | Admitting: Dietician

## 2017-09-02 ENCOUNTER — Ambulatory Visit: Payer: Self-pay | Admitting: Dietician

## 2017-10-21 ENCOUNTER — Ambulatory Visit: Payer: Self-pay | Admitting: Dietician

## 2017-12-02 IMAGING — US US ABDOMEN LIMITED
1 series · 14 of 25 positions shown · non-contrast
Comparison: None.

CLINICAL DATA: Elevated liver function tests.

EXAM:
ULTRASOUND ABDOMEN LIMITED RIGHT UPPER QUADRANT

[Series 1: us abdomen limited · 0.33mm/px · 14 of 78 slices shown]
[im 1/78]
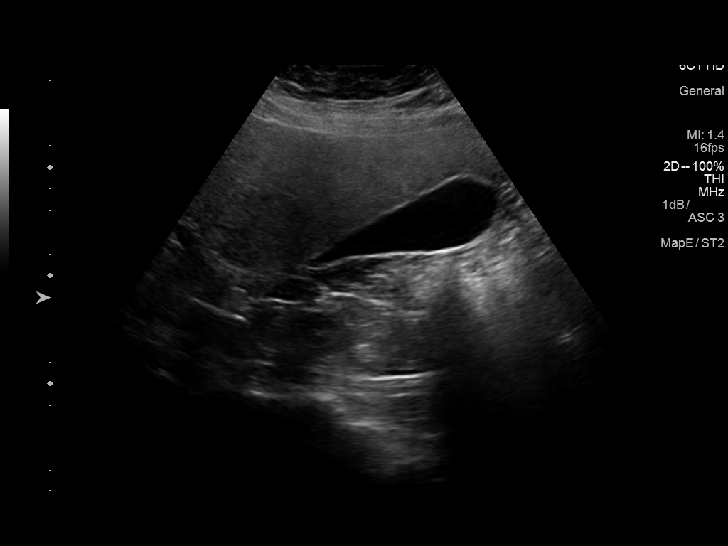
[im 7/78]
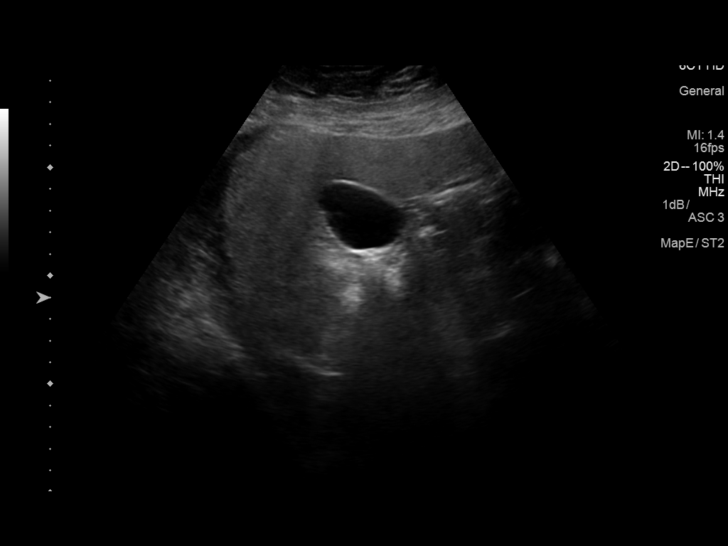
[im 13/78]
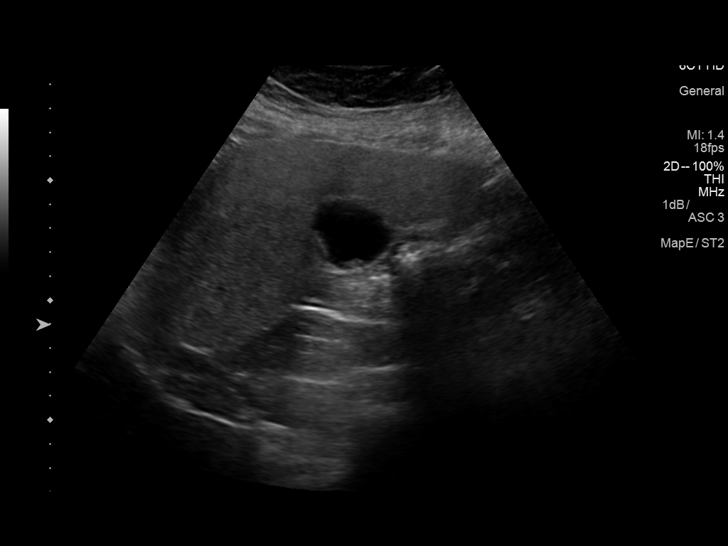
[im 20/78]
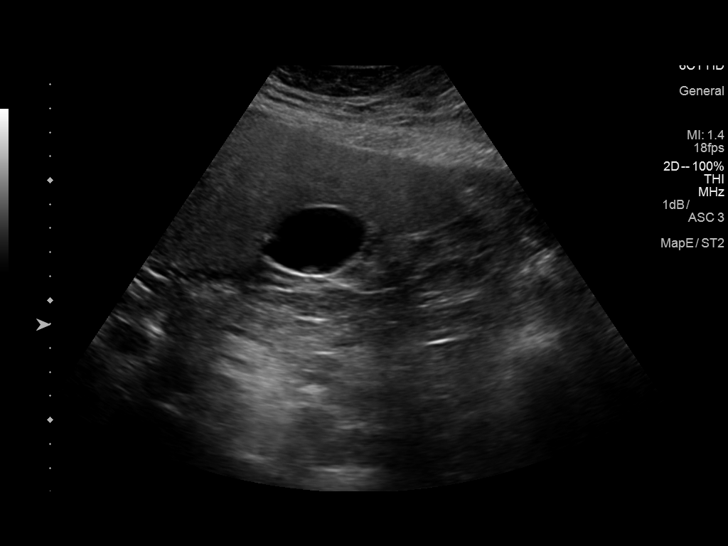
[im 26/78]
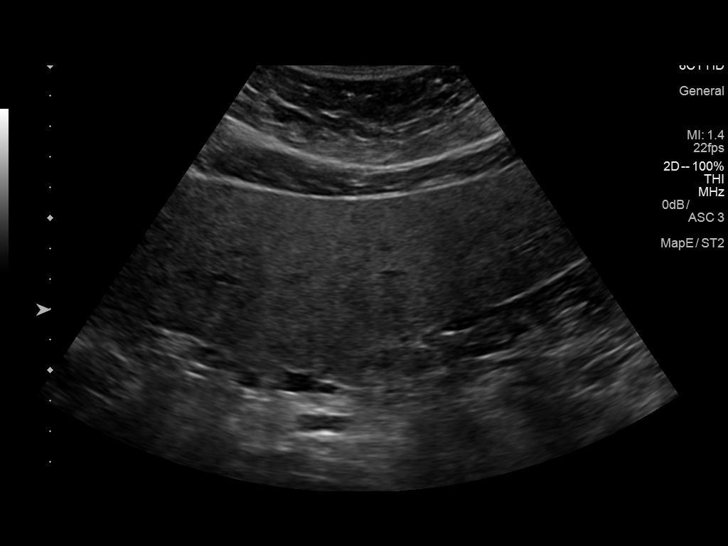
[im 29/78]
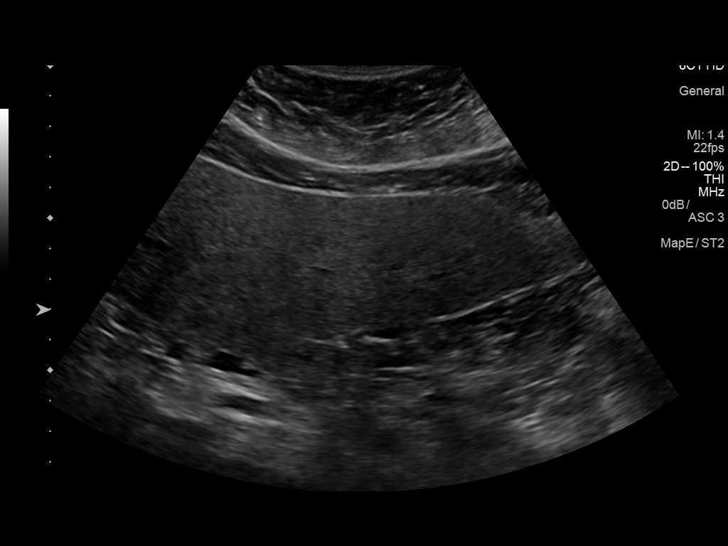
[im 36/78]
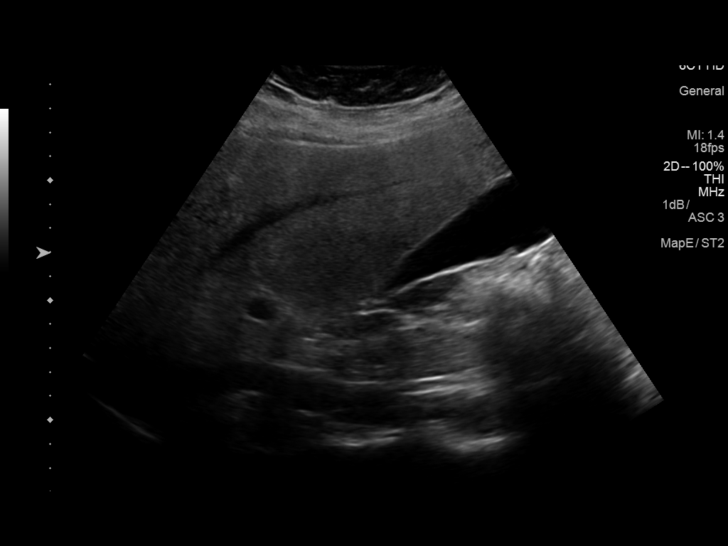
[im 42/78]
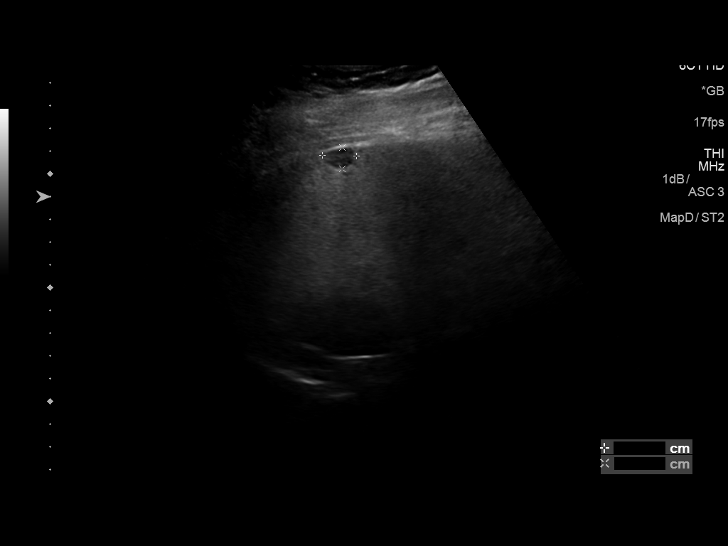
[im 49/78]
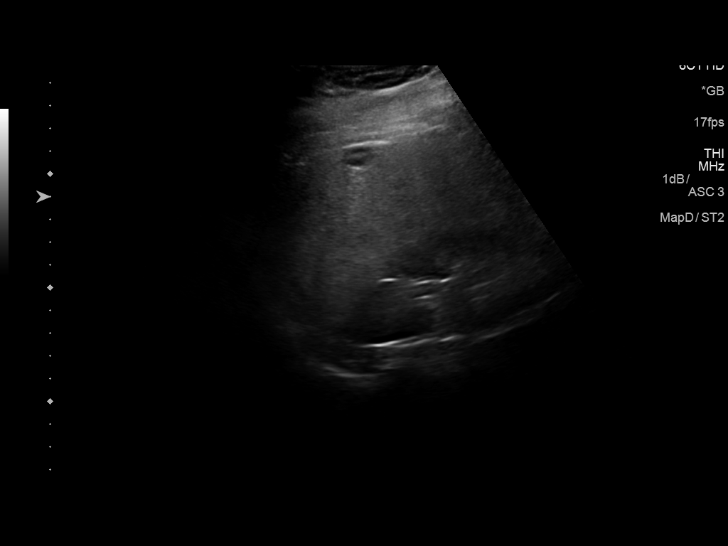
[im 52/78]
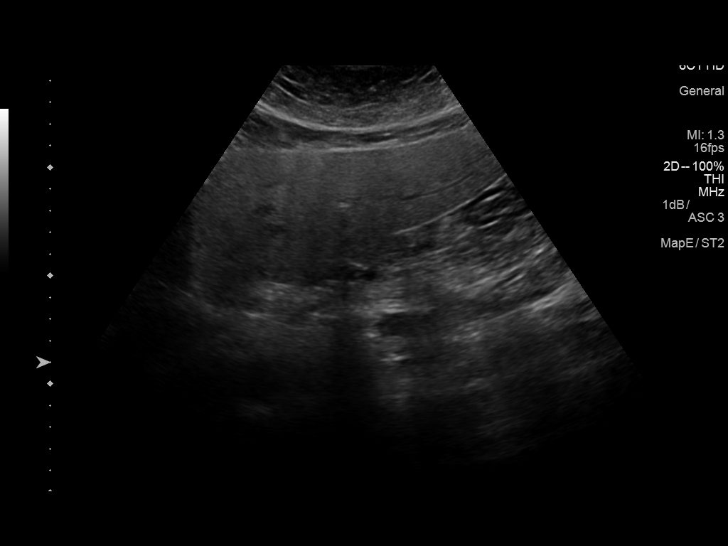
[im 58/78]
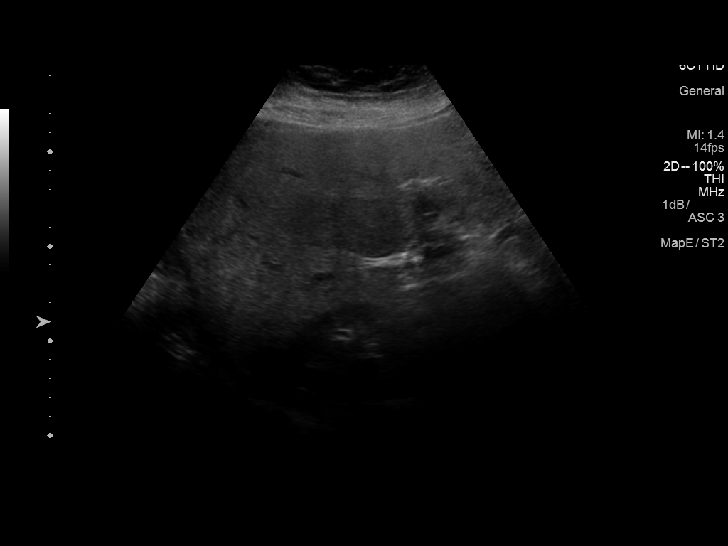
[im 65/78]
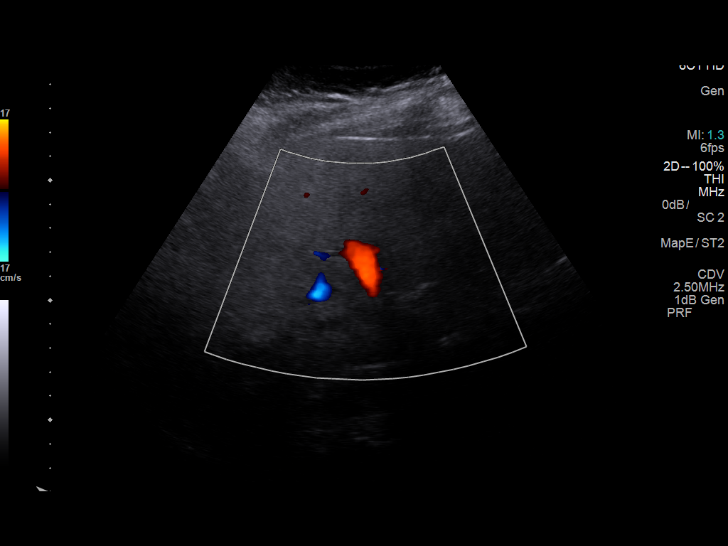
[im 71/78]
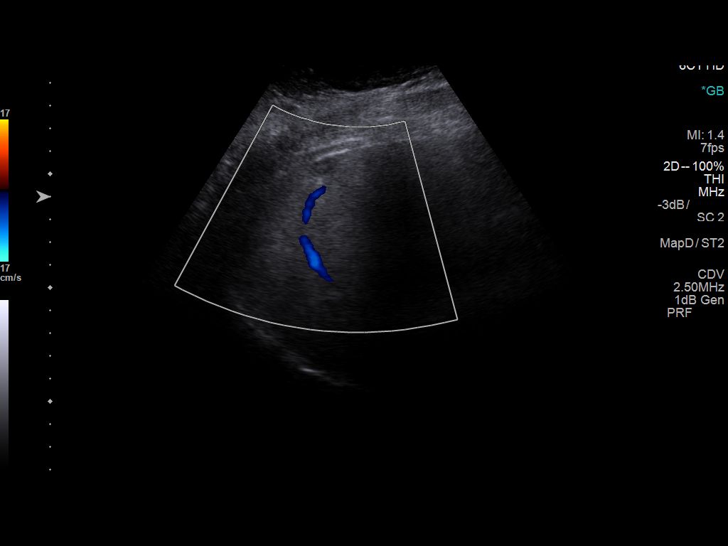
[im 78/78]
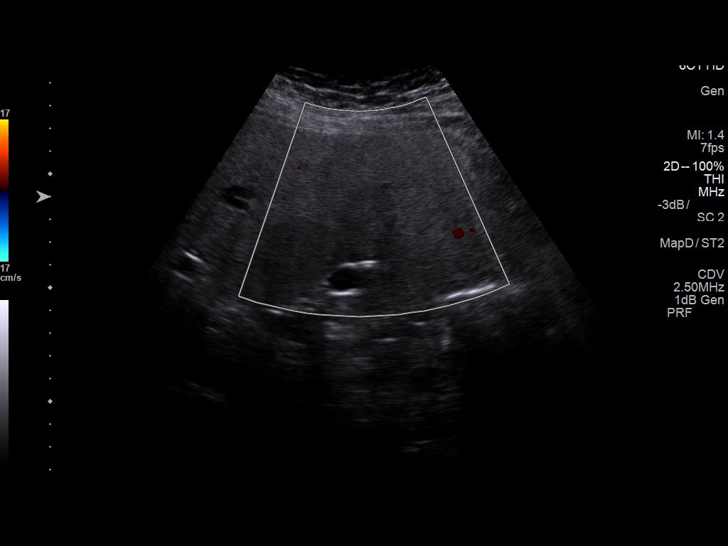

[14 of 25 positions shown; findings below may reference images not displayed]

FINDINGS: Gallbladder:

No gallstones or wall thickening visualized. No sonographic Murphy
sign noted by sonographer. 6 mm gallbladder polyp is noted.

Common bile duct:

Diameter: 7.8 mm which is mildly dilated.

Liver:

Increased echogenicity of hepatic parenchyma is noted most
consistent with fatty infiltration with probable sparing adjacent to
the gallbladder fossa. Hepatic cyst is noted anteriorly in right
hepatic lobe. Portal vein is patent on color Doppler imaging with
normal direction of blood flow towards the liver.
IMPRESSION: Increased echogenicity of hepatic parenchyma is noted most
consistent with fatty infiltration with sparing adjacent to
gallbladder fossa.

6 mm gallbladder polyp is noted. Followup ultrasound in 1 year is
recommended to ensure stability.

Common bile duct is mildly dilated at 7.8 mm. Correlation with liver
function tests is recommended to rule out distal common bile duct
obstruction.

## 2018-01-10 ENCOUNTER — Ambulatory Visit: Payer: 59 | Admitting: Family Medicine

## 2018-01-10 ENCOUNTER — Encounter: Payer: Self-pay | Admitting: Family Medicine

## 2018-01-10 VITALS — BP 118/76 | HR 84 | Temp 98.6°F | Ht 66.0 in | Wt 268.0 lb

## 2018-01-10 DIAGNOSIS — E119 Type 2 diabetes mellitus without complications: Secondary | ICD-10-CM | POA: Diagnosis not present

## 2018-01-10 LAB — MICROALBUMIN / CREATININE URINE RATIO
Creatinine,U: 114.1 mg/dL
MICROALB/CREAT RATIO: 0.6 mg/g (ref 0.0–30.0)
Microalb, Ur: <0.7 mg/dL (ref 0.0–1.9)

## 2018-01-10 LAB — POCT GLYCOSYLATED HEMOGLOBIN (HGB A1C): Hemoglobin A1C: 6.5 % — AB (ref 4.0–5.6)

## 2018-01-10 NOTE — Progress Notes (Signed)
Diabetes:  No meds.   Hypoglycemic episodes: no sx Hyperglycemic episodes: no sx Feet problems: no Blood Sugars averaging: not checked.   eye exam within last year:  Due, d/w pt.   A1c improved to 6.5.   She is down 12 lbs  "I'm trying to live better."   She is moving to 1st shift.  She is moving to [place], working at [employer].    Pap d/w pt.  No h/o abnormals.  Done about 3 years ago per patient report.  Flu shot to be done at work.    Meds, vitals, and allergies reviewed.   ROS: Per HPI unless specifically indicated in ROS section   GEN: nad, alert and oriented HEENT: mucous membranes moist NECK: supple w/o LA CV: rrr. PULM: ctab, no inc wob ABD: soft, +bs EXT: no edema SKIN: well perfused.   Diabetic foot exam: Normal inspection No skin breakdown No calluses  Normal DP pulses Normal sensation to light touch and monofilament Nails normal

## 2018-01-10 NOTE — Patient Instructions (Signed)
If you don't hear from [place] in 04/2018 about labs and ultrasound, then please contact us.   Thanks for you effort.  Take care.  Glad to see you.  Go to the lab on the way out.  We'll contact you with your lab report.

## 2018-01-11 NOTE — Assessment & Plan Note (Signed)
A1c improved to 6.5.  She is nearly not diabetic based on her A1c.  Discussed. She is down 12 lbs  "I'm trying to live better."   She is working on diet and exercise. She is moving to 1st shift.  She is moving to [place], working at [employer].    We talked about routine health maintenance and other follow-up items. If she doesn't hear from [place] in 04/2018 about labs and ultrasound, then she'll contact us.   She agrees.

## 2018-02-14 ENCOUNTER — Other Ambulatory Visit: Payer: Self-pay | Admitting: Hematology

## 2018-02-14 DIAGNOSIS — Z853 Personal history of malignant neoplasm of breast: Secondary | ICD-10-CM

## 2018-03-12 ENCOUNTER — Telehealth: Payer: Self-pay | Admitting: Hematology

## 2018-03-12 NOTE — Telephone Encounter (Signed)
YF CME 11/1 - moved lab/fu from 11/1 to 10/29. Spoke with patient and per patient moved from 10/29 to 11/4 - cannot come 10/29. Patient has new appointment for 11/4.

## 2018-03-24 ENCOUNTER — Ambulatory Visit
Admission: RE | Admit: 2018-03-24 | Discharge: 2018-03-24 | Disposition: A | Discharge status: Home / Self Care | Payer: Managed Care, Other (non HMO) | Source: Ambulatory Visit | Attending: Hematology | Admitting: Hematology

## 2018-03-24 DIAGNOSIS — Z853 Personal history of malignant neoplasm of breast: Secondary | ICD-10-CM

## 2018-04-08 ENCOUNTER — Ambulatory Visit: Payer: Self-pay | Admitting: Hematology

## 2018-04-08 ENCOUNTER — Other Ambulatory Visit: Payer: Self-pay

## 2018-04-11 ENCOUNTER — Ambulatory Visit: Payer: Self-pay | Admitting: Hematology

## 2018-04-11 ENCOUNTER — Other Ambulatory Visit: Payer: Self-pay

## 2018-04-11 ENCOUNTER — Telehealth: Payer: Self-pay | Admitting: Hematology

## 2018-04-11 NOTE — Telephone Encounter (Signed)
R/s appt per 11/1 sch message - pt is aware of appt date and time

## 2018-04-13 ENCOUNTER — Telehealth: Payer: Self-pay | Admitting: Family Medicine

## 2018-04-13 NOTE — Telephone Encounter (Signed)
Call pt.  I wanted to make sure she was going to be able to f/u about prev issues.   6 mm gallbladder polyp prev noted. Followup ultrasound due. Let me know if she needs me to put in the order.   Reasonable to recheck cmet and A1c given history of diabetes. I can order that or send her a lab slip if needed.    Please check with patient.  Thanks.

## 2018-04-14 ENCOUNTER — Ambulatory Visit: Payer: Self-pay | Admitting: Hematology

## 2018-04-14 ENCOUNTER — Other Ambulatory Visit: Payer: Self-pay

## 2018-04-14 NOTE — Telephone Encounter (Signed)
Patient states she will have to get back with [place] on the [place] because she doesn't know when she could get it done.  Please give me the diagnosis codes for the labs and I will mail her an order as requested by patient.

## 2018-04-15 ENCOUNTER — Other Ambulatory Visit: Payer: Managed Care, Other (non HMO)

## 2018-04-15 ENCOUNTER — Ambulatory Visit: Payer: Managed Care, Other (non HMO) | Admitting: Hematology

## 2018-04-15 ENCOUNTER — Telehealth: Payer: Self-pay

## 2018-04-15 ENCOUNTER — Encounter: Payer: Self-pay | Admitting: *Deleted

## 2018-04-15 NOTE — Telephone Encounter (Signed)
CMET, A1c. Dx E11.9.   I will await input from patient about scheduling u/s.   Thanks.

## 2018-04-15 NOTE — Telephone Encounter (Signed)
Per 11/5 vm return calls. Patient requested to r/s her appointment with [facility]. Appointment moved to next aval. with [facility]. Left a detailed voice msg. Will mail a calender with letter enclosed of theses [employer]

## 2018-04-15 NOTE — Telephone Encounter (Signed)
Order mailed to patient as requested.

## 2018-04-20 NOTE — Progress Notes (Signed)
[facility]  [phone] [phone]  Clinic Follow up Note   Patient Care Team: Tonia Ghent, MD as PCP - General (Family Medicine) Truitt Merle, MD as Consulting Physician (Hematology) Eppie Gibson, MD as Attending Physician (Radiation Oncology) Delice Bison Charlestine Massed, NP as Nurse Practitioner (Hematology and Oncology) Stark Klein, MD as Consulting Physician (General Surgery) 04/21/2018  SUMMARY OF ONCOLOGIC HISTORY: Oncology History   Breast cancer of upper-outer quadrant of left female breast [facility])   Staging form: Breast, AJCC 7th Edition   - Clinical stage from 02/10/2016: Stage 0 (Tis (DCIS), N0, M0) - Signed by Truitt Merle, MD on 03/06/2016   - Pathologic: Stage IA (T20mc, N0, cM0) - Signed by SEppie Gibson MD on 04/04/2016      Breast cancer of upper-outer quadrant of left female breast ([facility]   02/02/2016 Mammogram    Diagnostic mammogram showed 2 adjacent group of calcifications in the upper outer quadrant of left breast, suspicious for malignancy.    02/10/2016 Initial Diagnosis    Breast cancer of upper-outer quadrant of left female breast ([facility]    02/10/2016 Initial Biopsy    Left breast biopsy showed high grade DCIS . There is a small focus suspicious for early invasion.     02/10/2016 Receptors her2    ER negative, PR negative    03/19/2016 Surgery    Left breast lumpectomy    03/19/2016 Pathology Results    Left breast lumpectomy showed high-grade DCIS, 2.6 cm, with focal microinvasion (<0.1cm), margins were negative    03/19/2016 Receptors her2    ER negative, PR negative, HER-2 positive, with ratio 2.76 and copy # 15.3     04/27/2016 - 06/07/2016 Radiation Therapy    04/19/16 - 06/07/16  Site/dose:   Left Breast treated to 50.4 Gy in 28 fractions.                      Left Breast boosted to an additional 10 Gy in 5 fractions.    03/24/2018 Mammogram    IMPRESSION: Stable left breast lumpectomy site. No mammographic  evidence of malignancy in the bilateral breasts. RECOMMENDATION: Diagnostic mammogram is suggested in 1 year. (Code:DM-B-01Y)     CURRENT THERAPY: Surveillance   INTERVAL HISTORY: Kim Roberts for annual f/u as scheduled. She reports her health is better in general. She has lost 10 lbs intentionally since last f/u with "better living." She continues to see PCP, last f/u approx 3 months ago. She is due for PAP and plans to make appts with GYN. She denies fatigue. Appetite is normal. Denies n/v/c/d. She received the flu vaccine this year. Denies fever or chills. She had a sleep study and is in the process of getting CPAP. Denies cough, chest pain, dyspnea. She denies pain. She had mammogram in 03/2018. Denies breast changes including new mass or nipple discharge.    MEDICAL HISTORY:  Past Medical History:  Diagnosis Date  . [place]   . Colon polyp   . Heart murmur    as a child  . History of radiation therapy 04/19/16- 06/07/16   Left Breast/ 50.4 Gy in 28 fractions, Left Breast boosted/ 10 Gy in 5 fractions  . Hyperlipidemia   . Hypertension   . Menopause   . Personal history of chemotherapy   . Personal history of radiation therapy   . 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     SURGICAL HISTORY: Past Surgical History:  Procedure Laterality  Date  . BREAST BIOPSY    . BREAST LUMPECTOMY Left 03/19/2016   Procedure: SEED LOCALIZED LEFT BREAST LUMPECTOMY;  Surgeon: Stark Klein, MD;  Location: [place];  Service: General;  Laterality: Left;  . COLONOSCOPY    . MASS EXCISION  08/30/2011   Procedure: EXCISION MASS;  Surgeon: Stark Klein, MD;  Location: [facility];  Service: General;  Laterality: Right;  Excision of Right Axillary Mass   . POLYPECTOMY      I have reviewed the social history and family history with the patient and they are unchanged from previous note.  ALLERGIES:  is allergic to latex.  MEDICATIONS:  Current Outpatient  Medications  Medication Sig Dispense Refill  . Cholecalciferol (VITAMIN D3) 2000 units capsule Take 2,000 Units by mouth daily.    . Multiple Vitamins-Minerals (HAIR/SKIN/NAILS/BIOTIN) TABS Take 5,000 mcg by mouth every morning.    . vitamin B-12 (CYANOCOBALAMIN) 1000 MCG tablet Take 1,000 mcg by mouth daily.    . hydrochlorothiazide (HYDRODIURIL) 25 MG tablet TAKE 1 TABLET (25 MG TOTAL) DAILY BY MOUTH. 90 tablet 1  . potassium chloride (K-DUR) 10 MEQ tablet TAKE 1 TABLET DAILY BY MOUTH. 90 tablet 1   Current Facility-Administered Medications  Medication Dose Route Frequency Provider Last Rate Last Dose  . 0.9 %  sodium chloride infusion  500 mL Intravenous Continuous Milus Banister, MD        PHYSICAL EXAMINATION: ECOG PERFORMANCE STATUS: 0 - Asymptomatic  Vitals:   04/21/18 0823  BP: 133/87  Pulse: 92  Resp: 18  Temp: 98.7 F (37.1 C)  SpO2: 99%   Filed Weights   04/21/18 0823  Weight: 267 lb 11.2 oz (121.4 kg)    GENERAL:alert, no distress and comfortable SKIN:  no rashes or significant lesions EYES:  sclera clear LYMPH:  no palpable cervical or supraclavicular lymphadenopathy LUNGS: clear to auscultation with normal breathing effort HEART: regular rate & rhythm, no lower extremity edema ABDOMEN:abdomen soft, non-tender and normal bowel sounds Musculoskeletal:no cyanosis of digits and no clubbing  NEURO: alert & oriented x 3 with fluent speech, no focal motor deficits BREAST EXAM: inspection reveals breasts to be symmetrical without nipple discharge or inversion. Left breast s/p lumpectomy. Incision is well healed. No palpable mass in either breast or axilla that I could appreciate   LABORATORY DATA:  I have reviewed the data as listed CBC Latest Ref Rng & Units 04/21/2018 04/05/2017 10/05/2016  WBC 4.0 - 10.5 K/uL 6.2 6.3 6.2  Hemoglobin 12.0 - 15.0 g/dL 13.2 12.8 12.8  Hematocrit 36.0 - 46.0 % 40.6 38.4 38.7  Platelets 150 - 400 K/uL 263 286 275     CMP Latest  Ref Rng & Units 04/21/2018 04/22/2017 04/05/2017  Glucose 70 - 99 mg/dL 113(H) 128(H) 114  BUN 6 - 20 mg/dL 13 13 14.2  Creatinine 0.44 - 1.00 mg/dL 0.82 0.79 0.9  Sodium 135 - 145 mmol/L 141 140 141  Potassium 3.5 - 5.1 mmol/L 3.6 3.7 3.4(L)  Chloride 98 - 111 mmol/L 104 102 -  CO2 22 - 32 mmol/L '29 29 28  ' Calcium 8.9 - 10.3 mg/dL 9.4 9.6 9.5  Total Protein 6.5 - 8.1 g/dL 7.2 - 7.1  Total Bilirubin 0.3 - 1.2 mg/dL 0.8 - 0.62  Alkaline Phos 38 - 126 U/L 77 - 72  AST 15 - 41 U/L 36 - 47(H)  ALT 0 - 44 U/L 45(H) - 61(H)      RADIOGRAPHIC STUDIES: I have personally reviewed the  radiological images as listed and agreed with the findings in the report. No results found.   MAMMOGRAM 03/24/18:  IMPRESSION: Stable left breast lumpectomy site. No mammographic evidence of malignancy in the bilateral breasts.  RECOMMENDATION: Diagnostic mammogram is suggested in 1 year. (Code:DM-B-01Y)   ASSESSMENT & PLAN: 57 y.o. 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   1. 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 Her surgical margins were negative. -Dr. Barry Dienes did not feel she needed a second surgery for sentinel lymph node biopsy. But close follow-up would be reasonable. -She did not require adjuvant chemotherapy   -Given her negative ER and PR in tumor, she would not benefit from adjuvant antiestrogen therapy. -She was seen by a radiation oncologist Dr. Isidore Moos, completed adjuvant radiation from 04/19/16 - 06/07/16.  -She is clinically doing well. Breast exam is unremarkable. Labs are stable. 03/24/18 mammogram showed no evidence of malignancy in either breast. No clinical concern for recurrence.  -continue breast cancer surveillance and annual mammography.  -I encouraged her to make  f/u with GYN for routine care and cancer screening. -F/u annually with mammogram.  2. HTN, obesity  -She will follow-up with her primary care physician; she has lost weight intentionally with diet and exercise. A1c has improved. LFTs also improved from last year.   3. Mild intermittent transaminitis -Her liver enzymes was slightly elevated in 2014. He has been intermittently elevated since then; improved lately. - follow-up with her primary care physician  PLAN -Labs, mammogram reviewed, continue breast cancer surveillance  -Annual mammogram due 03/2019 -Continue f/u with PCP, make appt with GYN for PAP  -Signed and returned form for cancer benefit  -Annual f/u with lab and Dr Burr Medico in 04/2019   All questions were answered. The patient knows to call the clinic with any problems, questions or concerns. No barriers to learning was detected. I spent 20 minutes counseling the patient face to face. The total time spent in the appointment was 25 minutes and more than 50% was on counseling and review of test results     Alla Feeling, NP 04/21/18

## 2018-04-21 ENCOUNTER — Ambulatory Visit: Payer: Managed Care, Other (non HMO) | Admitting: Nurse Practitioner

## 2018-04-21 ENCOUNTER — Telehealth: Payer: Self-pay | Admitting: Nurse Practitioner

## 2018-04-21 ENCOUNTER — Other Ambulatory Visit: Payer: Self-pay | Admitting: Family Medicine

## 2018-04-21 ENCOUNTER — Inpatient Hospital Stay (HOSPITAL_BASED_OUTPATIENT_CLINIC_OR_DEPARTMENT_OTHER): Payer: Managed Care, Other (non HMO) | Admitting: Nurse Practitioner

## 2018-04-21 ENCOUNTER — Other Ambulatory Visit: Payer: Managed Care, Other (non HMO)

## 2018-04-21 ENCOUNTER — Inpatient Hospital Stay: Payer: Managed Care, Other (non HMO) | Attending: Nurse Practitioner

## 2018-04-21 ENCOUNTER — Encounter: Payer: Self-pay | Admitting: Nurse Practitioner

## 2018-04-21 VITALS — BP 133/87 | HR 92 | Temp 98.7°F | Resp 18 | Ht 66.0 in | Wt 267.7 lb

## 2018-04-21 DIAGNOSIS — C50412 Malignant neoplasm of upper-outer quadrant of left female breast: Secondary | ICD-10-CM

## 2018-04-21 DIAGNOSIS — Z79899 Other long term (current) drug therapy: Secondary | ICD-10-CM | POA: Insufficient documentation

## 2018-04-21 DIAGNOSIS — I10 Essential (primary) hypertension: Secondary | ICD-10-CM | POA: Diagnosis not present

## 2018-04-21 DIAGNOSIS — R740 Nonspecific elevation of levels of transaminase and lactic acid dehydrogenase [LDH]: Secondary | ICD-10-CM

## 2018-04-21 DIAGNOSIS — Z923 Personal history of irradiation: Secondary | ICD-10-CM

## 2018-04-21 DIAGNOSIS — Z171 Estrogen receptor negative status [ER-]: Secondary | ICD-10-CM | POA: Diagnosis not present

## 2018-04-21 DIAGNOSIS — E669 Obesity, unspecified: Secondary | ICD-10-CM

## 2018-04-21 LAB — CBC WITH DIFFERENTIAL/PLATELET
Abs Immature Granulocytes: 0.02 10*3/uL (ref 0.00–0.07)
Basophils Absolute: 0 10*3/uL (ref 0.0–0.1)
Basophils Relative: 0 %
Eosinophils Absolute: 0.1 10*3/uL (ref 0.0–0.5)
Eosinophils Relative: 2 %
HCT: 40.6 % (ref 36.0–46.0)
Hemoglobin: 13.2 g/dL (ref 12.0–15.0)
Immature Granulocytes: 0 %
Lymphocytes Relative: 48 %
Lymphs Abs: 2.9 10*3/uL (ref 0.7–4.0)
MCH: 30.6 pg (ref 26.0–34.0)
MCHC: 32.5 g/dL (ref 30.0–36.0)
MCV: 94.2 fL (ref 80.0–100.0)
Monocytes Absolute: 0.4 10*3/uL (ref 0.1–1.0)
Monocytes Relative: 7 %
Neutro Abs: 2.6 10*3/uL (ref 1.7–7.7)
Neutrophils Relative %: 43 %
Platelets: 263 10*3/uL (ref 150–400)
RBC: 4.31 MIL/uL (ref 3.87–5.11)
RDW: 14.7 % (ref 11.5–15.5)
WBC: 6.2 10*3/uL (ref 4.0–10.5)
nRBC: 0 % (ref 0.0–0.2)

## 2018-04-21 LAB — COMPREHENSIVE METABOLIC PANEL WITH GFR
ALT: 45 U/L — ABNORMAL HIGH (ref 0–44)
AST: 36 U/L (ref 15–41)
Albumin: 3.8 g/dL (ref 3.5–5.0)
Alkaline Phosphatase: 77 U/L (ref 38–126)
Anion gap: 8 (ref 5–15)
BUN: 13 mg/dL (ref 6–20)
CO2: 29 mmol/L (ref 22–32)
Calcium: 9.4 mg/dL (ref 8.9–10.3)
Chloride: 104 mmol/L (ref 98–111)
Creatinine, Ser: 0.82 mg/dL (ref 0.44–1.00)
GFR calc Af Amer: >60 mL/min
GFR calc non Af Amer: >60 mL/min
Glucose, Bld: 113 mg/dL — ABNORMAL HIGH (ref 70–99)
Potassium: 3.6 mmol/L (ref 3.5–5.1)
Sodium: 141 mmol/L (ref 135–145)
Total Bilirubin: 0.8 mg/dL (ref 0.3–1.2)
Total Protein: 7.2 g/dL (ref 6.5–8.1)

## 2018-04-21 NOTE — Telephone Encounter (Signed)
Scheduled appt per 11/11 los - pt is aware of appt date and time.  - f/u in one year - gave patient AVS

## 2018-05-02 ENCOUNTER — Other Ambulatory Visit: Payer: Managed Care, Other (non HMO)

## 2018-05-02 ENCOUNTER — Ambulatory Visit: Payer: Managed Care, Other (non HMO) | Admitting: Hematology

## 2018-07-16 ENCOUNTER — Telehealth: Payer: Self-pay | Admitting: Family Medicine

## 2018-07-16 DIAGNOSIS — K824 Cholesterolosis of gallbladder: Secondary | ICD-10-CM

## 2018-07-16 NOTE — Telephone Encounter (Signed)
Pt called office requesting to get a abdominal ultrasound. Pt states she spoke with Dr.Duncan previously. Please put in order. Pt also wanted to note that she is available on 2/19 anytime after 10am or 07/31/18 anytime after 1pm.

## 2018-07-16 NOTE — Telephone Encounter (Signed)
Ordered.  Also needs a follow-up A1c.  Let me know if she wants to get that done here.  Otherwise she can use the order that was previously mailed.  Thanks.

## 2018-07-16 NOTE — Addendum Note (Signed)
Addended by: Tonia Ghent on: 07/16/2018 03:55 PM   Modules accepted: Orders

## 2018-07-16 NOTE — Telephone Encounter (Signed)
Spoke with patient. Patient asked for lab order for CMET and A1C to be mailed to her again. She moved and lost the other lab order. Please review. (updated patient's address)

## 2018-07-17 ENCOUNTER — Encounter: Payer: Self-pay | Admitting: *Deleted

## 2018-07-17 NOTE — Telephone Encounter (Signed)
Letter printed. Thanks!

## 2018-07-17 NOTE — Telephone Encounter (Signed)
Mailed as requested.

## 2018-07-31 ENCOUNTER — Ambulatory Visit
Admission: RE | Admit: 2018-07-31 | Discharge: 2018-07-31 | Disposition: A | Discharge status: Home / Self Care | Payer: Managed Care, Other (non HMO) | Source: Ambulatory Visit | Attending: Family Medicine | Admitting: Family Medicine

## 2018-07-31 DIAGNOSIS — K824 Cholesterolosis of gallbladder: Secondary | ICD-10-CM

## 2018-09-22 ENCOUNTER — Ambulatory Visit: Payer: Managed Care, Other (non HMO) | Admitting: Family Medicine

## 2018-09-25 ENCOUNTER — Telehealth: Payer: Self-pay

## 2018-09-25 ENCOUNTER — Ambulatory Visit (INDEPENDENT_AMBULATORY_CARE_PROVIDER_SITE_OTHER): Payer: Managed Care, Other (non HMO) | Admitting: Family Medicine

## 2018-09-25 DIAGNOSIS — M109 Gout, unspecified: Secondary | ICD-10-CM | POA: Diagnosis not present

## 2018-09-25 MED ORDER — COLCHICINE 0.6 MG PO TABS: 0.6000 mg | ORAL_TABLET | Freq: Every day | ORAL | 0 refills | Status: DC | PRN

## 2018-09-25 NOTE — Telephone Encounter (Signed)
Inbound care to triage - patient reports the following concern  Possible insect bite to left big toe on 4/14.   Toe is red, swollen, and painful (9/10). Skin intact; no discharge. Affecting ability to walk.   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  Virtual video appt scheduled 09/25/18 @ 1530.

## 2018-09-25 NOTE — Telephone Encounter (Signed)
See visit note.  Thanks.

## 2018-09-25 NOTE — Progress Notes (Signed)
Interactive audio and video telecommunications were attempted between this provider and patient, however failed, due to patient having technical difficulties OR patient did not have access to video capability.  We continued and completed visit with audio only.   Virtual Visit via Telephone Note  I connected with patient on 09/25/18 at 3:35 PM by telephone and verified that I am speaking with the correct person using two identifiers.  Location of patient: at work  Location of MD: [facility] Name of referring provider (if blank then none associated): Names per persons and role in encounter:  MD: Earlyne Iba, Patient: name listed above.    I discussed the limitations, risks, security and privacy concerns of performing an evaluation and management service by telephone and the availability of in person appointments. I also discussed with the patient that there may be a patient responsible charge related to this service. The patient expressed understanding and agreed to proceed.  CC: skin lesion.   HPI: near L 1st toe. She worked on 09/23/2018.  Noted pain near the toe later that day.  No trauma.  No known insect sting, etc.  Local redness.  Local swelling.  Pain with sheet laying on her foot.  No drainage.     It sounds like she could have a gout flare.  D/w pt.  She is on HCTZ.    No fevers.    Discussed breast cancer hx, but it should still be okay for patient to work around patients.    Meds and allergies reviewed.   ROS: Per HPI unless specifically indicated in ROS section   NAD Speech wnl  A/P: presumed gout.  Try colchicine.  We may need to change HCTZ after I see her labs.    She'll get labs done.  Letter done for  BMET, A1c. Dx E11.9. Uric acid Dx M10.9.     I discussed the assessment and treatment plan with the patient. The patient was provided an opportunity to ask questions and all were answered. The patient agreed with the plan and demonstrated an understanding  of the instructions.   The patient was advised to call back or seek an in-person evaluation if the symptoms worsen or if the condition fails to improve as anticipated.  I provided 21 minutes of non-face-to-face time during this encounter.   Elsie Stain, MD

## 2018-09-26 ENCOUNTER — Encounter: Payer: Self-pay | Admitting: Family Medicine

## 2018-09-26 LAB — HEPATIC FUNCTION PANEL
ALT: 42 — AB (ref 7–35)
AST: 37 — AB (ref 13–35)

## 2018-09-26 LAB — HEMOGLOBIN A1C: Hemoglobin A1C: 6.7

## 2018-09-26 LAB — URIC ACID: Uric Acid: 9.1

## 2018-09-26 LAB — BASIC METABOLIC PANEL
Creatinine: 0.8 (ref 0.5–1.1)
Glucose: 112

## 2018-09-28 DIAGNOSIS — M109 Gout, unspecified: Secondary | ICD-10-CM | POA: Insufficient documentation

## 2018-09-28 NOTE — Assessment & Plan Note (Signed)
presumed gout.  Pathophysiology of gout discussed with patient. Try colchicine with routine cautions discussed with patient. We may need to change HCTZ after I see her labs.    She'll get labs done.  Letter done for  BMET, A1c. Dx E11.9. Uric acid Dx M10.9.   She can print off letter to get labs done. Discussed with patient about gout diet.

## 2018-10-03 ENCOUNTER — Telehealth: Payer: Self-pay

## 2018-10-03 NOTE — Telephone Encounter (Signed)
Pt returned call to the office. Pt requests call back. Pt stated if she does not answer a message can be left on her voicemail.

## 2018-10-03 NOTE — Telephone Encounter (Signed)
Left message for patient to call back to relay Dr. Josefine Class recommendations/lab results notes. (it is on paper copy on Lugene's desk). Results abstracted.

## 2018-10-06 NOTE — Telephone Encounter (Signed)
Since results are on desk in office and I am working remotely, I will forward this to Anastasiya.   Thanks.

## 2018-10-07 NOTE — Telephone Encounter (Signed)
Spoke with patient and advised her of lab results. Patient states that her symptoms have resolved after taking Colchicine for 1 week. Feels so much better. As far as her b/p, patient states she will have this checked and send a mychart message to Dr. Damita Dunnings to let him know. Sending lab report for scanning

## 2018-11-19 ENCOUNTER — Telehealth: Payer: Self-pay | Admitting: Radiology

## 2018-11-19 NOTE — Telephone Encounter (Signed)
Spoke with patient to schedule Annual Exam (last 12/2014) with Dr Kennon Rounds, patient states that she will call back in Jul to schedule for August Appointment.

## 2018-11-25 ENCOUNTER — Telehealth: Payer: Self-pay

## 2018-11-25 NOTE — Telephone Encounter (Signed)
[place] Night - Client TELEPHONE ADVICE RECORD [facility] Patient Name: Kim Roberts Gender: Unknown DOB: 1961-06-01 Age: 58 Y 10 M 13 D Return Phone Number: [phone] (Primary) Address: City/State/Zip: [place] [address] Client [place] Night - Client Client Site [place] Physician Renford Dills - MD Contact Type Call Who Is Calling Patient / Member / Family / Caregiver Call Type Triage / Clinical Relationship To Patient Self Return Phone Number [phone] (Primary) Chief Complaint Foot Pain Reason for Call Symptomatic / Request for [place] states that they are calling her gout pain and she is looking to see if she can get her medication changed. It is her right foot. Translation No Nurse Assessment Nurse: Roda Shutters, RN, Jane Date/Time (Eastern Time): 11/25/2018 7:21:27 AM Confirm and document reason for call. If symptomatic, describe symptoms. ---Caller states she is having gout pain on her right toe. The pain level is a 3. The caller wants to change the meds cos she feels it is not working. Has the patient had close contact with a person known or suspected to have the novel coronavirus illness OR traveled / lives in area with major community spread (including international travel) in the last 14 days from the onset of symptoms? * If Asymptomatic, screen for exposure and travel within the last 14 days. ---No Does the patient have any new or worsening symptoms? ---Yes Will a triage be completed? ---Yes Related visit to physician within the last 2 weeks? ---No Does the PT have any chronic conditions? (i.e. diabetes, asthma, this includes High risk factors for pregnancy, etc.) ---Yes List chronic conditions. ---gout, htn Is this a behavioral health or substance abuse call? ---No Guidelines Guideline Title Affirmed Question Affirmed Notes  Nurse Date/Time (Eastern Time) Foot Pain [1] Swollen foot AND [2] no fever (Exceptions: localized bump from bunions, calluses, insect bite, sting) G[place], RN, Opal Sidles 11/25/2018 7:24:20 AM PLEASE NOTE: All timestamps contained within this report are represented as [place] Standard Time. CONFIDENTIALTY NOTICE: This fax transmission is intended only for the addressee. It contains information that is legally privileged, confidential or otherwise protected from use or disclosure. If you are not the intended recipient, you are strictly prohibited from reviewing, disclosing, copying using or disseminating any of this information or taking any action in reliance on or regarding this information. If you have received this fax in error, please notify us immediately by telephone so that we can arrange for its return to [place]. Phone: [phone], Toll-Free: [phone], Fax: [phone] Page: 2 of 2 Call Id: [id number] [place]. Time Eilene Ghazi Time) Disposition Final User 11/25/2018 7:31:17 AM See PCP within 24 Hours Yes Roda Shutters, RN, Irving Copas Disagree/Comply Comply Caller Understands Yes PreDisposition InappropriateToAsk Care Advice Given Per Guideline SEE PCP WITHIN 24 HOURS: * IF OFFICE WILL BE OPEN: You need to be seen within the next 24 hours. Call your doctor (or NP/PA) when the office opens and make an appointment. PAIN MEDICINES: * For pain relief, take acetaminophen, ibuprofen, or naproxen. * Use the lowest amount that makes your pain feel better. IBUPROFEN (E.G., MOTRIN, ADVIL): * Take 400 mg (two 200 mg pills) by mouth every 6 hours as needed. CALL BACK IF: * Fever occurs * You become worse. CARE ADVICE given per Foot Pain (Adult) guideline. Referrals REFERRED TO PCP OFFICE

## 2018-11-25 NOTE — Telephone Encounter (Signed)
Patient is still having pain in the right foot, and in particular the left side of the right foot.  Rates pain between a 3-5.    She has been taking the colchicine twice daily for the last couple of the days and she did take ibuprofen per on call nurses recommendations.  It may be getting some better but she wants to know if he can increase her gout medication or recommend something to take in addition to help with increasing pain?  She knows that we will be back in touch with her tomorrow as it is after 5pm today.

## 2018-11-26 NOTE — Telephone Encounter (Signed)
Since she is on hydrochlorothiazide, I would stop that now.  I want her to let me know about her blood pressure as we will likely need to make adjustments if her blood pressure is greater than 140/90 when she stops the medication. Stopping the hydrochlorothiazide may lower her uric acid some and that may help prevent another flare.  I would continue to treat what is a presumed gout flare with colchicine, since that appears to be helping some. I would not add on preventive medications in the midst of a flare since that can make her situation worse. If she is doing dramatically better off the hydrochlorothiazide she may not need another preventive medication added.  Long story short- 1.  Stop hydrochlorothiazide 2.  Update me about her blood pressure 3.  Continue colchicine/ibuprofen and let me know if the gout flare does not continue to improve 4.  We may need to check labs later on regarding a follow-up uric acid level after she has been off hydrochlorothiazide for period of time.  We will go from there.  Thanks.

## 2018-11-26 NOTE — Telephone Encounter (Signed)
Pt notified as instructed and voiced understanding. Pt does have appt on 12/29/18 for annual physical.

## 2018-12-22 ENCOUNTER — Other Ambulatory Visit: Payer: Self-pay | Admitting: Family Medicine

## 2018-12-29 ENCOUNTER — Ambulatory Visit (INDEPENDENT_AMBULATORY_CARE_PROVIDER_SITE_OTHER): Payer: Managed Care, Other (non HMO) | Admitting: Obstetrics & Gynecology

## 2018-12-29 ENCOUNTER — Other Ambulatory Visit: Payer: Self-pay

## 2018-12-29 ENCOUNTER — Encounter: Payer: Self-pay | Admitting: Obstetrics & Gynecology

## 2018-12-29 ENCOUNTER — Encounter: Payer: Self-pay | Admitting: Family Medicine

## 2018-12-29 ENCOUNTER — Ambulatory Visit (INDEPENDENT_AMBULATORY_CARE_PROVIDER_SITE_OTHER): Payer: Managed Care, Other (non HMO) | Admitting: Family Medicine

## 2018-12-29 VITALS — BP 126/72 | HR 103 | Temp 98.3°F | Ht 64.5 in | Wt 276.4 lb

## 2018-12-29 VITALS — BP 140/84 | HR 72 | Wt 278.0 lb

## 2018-12-29 DIAGNOSIS — B3731 Acute candidiasis of vulva and vagina: Secondary | ICD-10-CM

## 2018-12-29 DIAGNOSIS — Z7189 Other specified counseling: Secondary | ICD-10-CM

## 2018-12-29 DIAGNOSIS — Z124 Encounter for screening for malignant neoplasm of cervix: Secondary | ICD-10-CM

## 2018-12-29 DIAGNOSIS — E119 Type 2 diabetes mellitus without complications: Secondary | ICD-10-CM | POA: Diagnosis not present

## 2018-12-29 DIAGNOSIS — Z1151 Encounter for screening for human papillomavirus (HPV): Secondary | ICD-10-CM

## 2018-12-29 DIAGNOSIS — N898 Other specified noninflammatory disorders of vagina: Secondary | ICD-10-CM

## 2018-12-29 DIAGNOSIS — Z9229 Personal history of other drug therapy: Secondary | ICD-10-CM

## 2018-12-29 DIAGNOSIS — Z113 Encounter for screening for infections with a predominantly sexual mode of transmission: Secondary | ICD-10-CM

## 2018-12-29 DIAGNOSIS — R7989 Other specified abnormal findings of blood chemistry: Secondary | ICD-10-CM

## 2018-12-29 DIAGNOSIS — Z0000 Encounter for general adult medical examination without abnormal findings: Secondary | ICD-10-CM | POA: Diagnosis not present

## 2018-12-29 DIAGNOSIS — B373 Candidiasis of vulva and vagina: Secondary | ICD-10-CM | POA: Diagnosis not present

## 2018-12-29 DIAGNOSIS — Z1231 Encounter for screening mammogram for malignant neoplasm of breast: Secondary | ICD-10-CM

## 2018-12-29 DIAGNOSIS — Z01419 Encounter for gynecological examination (general) (routine) without abnormal findings: Secondary | ICD-10-CM

## 2018-12-29 DIAGNOSIS — M109 Gout, unspecified: Secondary | ICD-10-CM

## 2018-12-29 MED ORDER — COLCHICINE 0.6 MG PO TABS: ORAL_TABLET | ORAL | 1 refills | Status: DC

## 2018-12-29 NOTE — Progress Notes (Signed)
CPE- See plan.  Routine anticipatory guidance given to patient.  See health maintenance.  The possibility exists that previously documented standard health maintenance information may have been brought forward from a previous encounter into this note.  If needed, that same information has been updated to reflect the current situation based on today's encounter.    Tetanus 2012  Flu done at work ~03/2018 PNA not due  Shingles d/w pt.  See avs.   Pap per gyn 2020 Mammogram pending 2020.   DXA d/w pt. Defer 2020.   Living will d/w pt. Brother Lupita Shutter designated if patient were incapacitated.   Diet and exercise d/w pt.   Gout.  She is off HCTZ.  She had a mild flare about 8 days ago.  She had cut out scallops.  She used colchicine prn for R1st MTP pain.  This was her 3rd episode overall.  D/w pt about taking colchicine BID for 1st day.  Defer proph med given timeline, d/w pt.  She agrees.   Prev u/s d/w pt.  H/o LFT elevation.   Stable 6 mm gallbladder polyp with second 5-6 mm gallbladder polyp not well seen on the previous exam. Recommend additional follow-up ultrasound 1 year.  Mild hepatic steatosis. Stable 1 cm hypoechoic mass over the right lobe likely complicated cyst.  See notes on follow-up labs  DM2.  No meds.  No tingling.  Not checking sugar.  No sx of low sugar.  Prev A1c d/w pt.    PMH and SH reviewed  Meds, vitals, and allergies reviewed.   ROS: Per HPI.  Unless specifically indicated otherwise in HPI, the patient denies:  General: fever. Eyes: acute vision changes ENT: sore throat Cardiovascular: chest pain Respiratory: SOB GI: vomiting GU: dysuria Musculoskeletal: acute back pain Derm: acute rash Neuro: acute motor dysfunction Psych: worsening mood Endocrine: polydipsia Heme: bleeding Allergy: hayfever  GEN: nad, alert and oriented HEENT: mucous membranes moist NECK: supple w/o LA CV: rrr. PULM: ctab, no inc wob ABD: soft, +bs EXT: no edema SKIN: no  acute rash  Diabetic foot exam: Normal inspection No skin breakdown No calluses  Normal DP pulses Normal sensation to light touch and monofilament Nails normal

## 2018-12-29 NOTE — Patient Instructions (Addendum)
Check with your insurance to see if they will cover the shingrix shot. Use the eat right diet and check [email or web address] with the type 2 section.   Go to the lab on the way out.  We'll contact you with your lab report.  We'll make plans when I see your labs.  Take care.  Glad to see you.  Update me as needed.

## 2018-12-29 NOTE — Progress Notes (Signed)
GYNECOLOGY ANNUAL PREVENTATIVE CARE ENCOUNTER NOTE  History:     Kim Roberts is a 58 y.o. female here for a routine annual gynecologic exam.  Current complaints: has occasional, non-irritating vaginal discharge. Desires annual STI vaginal screen.   Denies abnormal vaginal bleeding, discharge, pelvic pain, problems with intercourse or other gynecologic concerns.    Gynecologic History No LMP recorded. Patient is postmenopausal. Contraception: post menopausal status Last Pap: 12/2014. Results were: normal with negative HPV Last mammogram: 03/30/19. Results were: normal  Obstetric History OB History  No obstetric history on file.    Past Medical History:  Diagnosis Date  . Breast cancer ([place])   . Colon polyp   . Heart murmur    as a child  . History of radiation therapy 04/19/16- 06/07/16   Left Breast/ 50.4 Gy in 28 fractions, Left Breast boosted/ 10 Gy in 5 fractions  . Hyperlipidemia   . Hypertension   . Menopause   . Personal history of chemotherapy   . Personal history of radiation therapy   . 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     Past Surgical History:  Procedure Laterality Date  . BREAST BIOPSY    . BREAST LUMPECTOMY Left 03/19/2016   Procedure: SEED LOCALIZED LEFT BREAST LUMPECTOMY;  Surgeon: Stark Klein, MD;  Location: [facility];  Service: General;  Laterality: Left;  . COLONOSCOPY    . MASS EXCISION  08/30/2011   Procedure: EXCISION MASS;  Surgeon: Stark Klein, MD;  Location: [facility];  Service: General;  Laterality: Right;  Excision of Right Axillary Mass   . POLYPECTOMY      Current Outpatient Medications on File Prior to Visit  Medication Sig Dispense Refill  . Cholecalciferol (VITAMIN D3) 2000 units capsule Take 2,000 Units by mouth daily.    . colchicine 0.6 MG tablet Take 1 tablet (0.6 mg total) by mouth daily as needed (for gout). Okay to fill with mitigare/colchicine/colcrys. 30 tablet 0  . Multiple Vitamins-Minerals  (HAIR/SKIN/NAILS/BIOTIN) TABS Take 5,000 mcg by mouth every morning.    . potassium chloride (K-DUR) 10 MEQ tablet TAKE 1 TABLET DAILY BY MOUTH. 90 tablet 1  . vitamin B-12 (CYANOCOBALAMIN) 1000 MCG tablet Take 1,000 mcg by mouth daily.    . [DISCONTINUED] potassium chloride (K-DUR) 10 MEQ tablet TAKE 1 TABLET BY MOUTH DAILY 30 tablet 3   Current Facility-Administered Medications on File Prior to Visit  Medication Dose Route Frequency Provider Last Rate Last Dose  . 0.9 %  sodium chloride infusion  500 mL Intravenous Continuous Milus Banister, MD        Allergies  Allergen Reactions  . Latex Rash    Social History:  reports that she has never smoked. She has never used smokeless tobacco. She reports current alcohol use. She reports that she does not use drugs.  Family History  Problem Relation Age of Onset  . Colon cancer Mother 38  . Lung cancer Father 25  . Seizures Brother   . Colon cancer Brother 36  . Diabetes Paternal Uncle   . Asthma Other   . Esophageal cancer Neg Hx   . Stomach cancer Neg Hx   . Breast cancer Neg Hx     The following portions of the patient's history were reviewed and updated as appropriate: allergies, current medications, past family history, past medical history, past social history, past surgical history and problem list.  Review of Systems Pertinent items noted in HPI and remainder of  comprehensive ROS otherwise negative.  Physical Exam:  BP 140/84   Pulse 72   Wt 278 lb (126.1 kg)   BMI 44.87 kg/m  CONSTITUTIONAL: Well-developed, well-nourished female in no acute distress.  HENT:  Normocephalic, atraumatic, External right and left ear normal. Oropharynx is clear and moist EYES: Conjunctivae and EOM are normal. Pupils are equal, round, and reactive to light. No scleral icterus.  NECK: Normal range of motion, supple, no masses.  Normal thyroid.  SKIN: Skin is warm and dry. No rash noted. Not diaphoretic. No erythema. No pallor. NEUROLOGIC:  Alert and oriented to person, place, and time. Normal reflexes, muscle tone coordination. No cranial nerve deficit noted. PSYCHIATRIC: Normal mood and affect. Normal behavior. Normal judgment and thought content. CARDIOVASCULAR: Normal heart rate noted, regular rhythm RESPIRATORY: Clear to auscultation bilaterally. Effort and breath sounds normal, no problems with respiration noted. BREASTS: Symmetric in size. No masses, skin changes, nipple drainage, or lymphadenopathy. ABDOMEN: Soft, obese, normal bowel sounds, no distention appreciated.  No tenderness, rebound or guarding.  MUSCULOSKELETAL: Normal range of motion. No tenderness.  No cyanosis, clubbing, or edema.  2+ distal pulses. PELVIC: Normal appearing external genitalia; normal appearing vaginal mucosa and cervix.  Normal appearing discharge.  Pap smear obtained, cervical stenosis noted.  Unable to palpate uterus or adnexa secondary to habitus.   Assessment and Plan:    1. Breast cancer screening by mammogram Diagnostic mamm[facility] given history of left lumpectomy. - MM DIAG BREAST TOMO BILATERAL; Future  2. Well woman exam with routine gynecological exam - Cytology - PAP( [facility]) - Cervicovaginal ancillary only( [facility]) Will follow up results of pap smear, testing sample and manage accordingly. Routine preventative health maintenance measures emphasized. Please refer to After Visit Summary for other counseling recommendations.      Verita Schneiders, MD, [place] for [employer], [place]

## 2018-12-29 NOTE — Patient Instructions (Signed)

## 2018-12-30 LAB — LIPID PANEL
Chol/HDL Ratio: 4 ratio (ref 0.0–4.4)
Cholesterol, Total: 197 mg/dL (ref 100–199)
HDL: 49 mg/dL (ref >39)
LDL Calculated: 113 mg/dL — ABNORMAL HIGH (ref 0–99)
Triglycerides: 174 mg/dL — ABNORMAL HIGH (ref 0–149)
VLDL Cholesterol Cal: 35 mg/dL (ref 5–40)

## 2018-12-30 LAB — COMPREHENSIVE METABOLIC PANEL
ALT: 55 IU/L — ABNORMAL HIGH (ref 0–32)
AST: 49 IU/L — ABNORMAL HIGH (ref 0–40)
Albumin/Globulin Ratio: 1.8 (ref 1.2–2.2)
Albumin: 4.4 g/dL (ref 3.8–4.9)
Alkaline Phosphatase: 82 IU/L (ref 39–117)
BUN/Creatinine Ratio: 20 (ref 9–23)
BUN: 15 mg/dL (ref 6–24)
Bilirubin Total: 0.5 mg/dL (ref 0.0–1.2)
CO2: 20 mmol/L (ref 20–29)
Calcium: 9.5 mg/dL (ref 8.7–10.2)
Chloride: 103 mmol/L (ref 96–106)
Creatinine, Ser: 0.76 mg/dL (ref 0.57–1.00)
GFR calc Af Amer: 102 mL/min/{1.73_m2} (ref >59)
GFR calc non Af Amer: 89 mL/min/{1.73_m2} (ref >59)
Globulin, Total: 2.4 g/dL (ref 1.5–4.5)
Glucose: 96 mg/dL (ref 65–99)
Potassium: 4.2 mmol/L (ref 3.5–5.2)
Sodium: 144 mmol/L (ref 134–144)
Total Protein: 6.8 g/dL (ref 6.0–8.5)

## 2018-12-30 LAB — MICROALBUMIN / CREATININE URINE RATIO
Creatinine, Urine: 304 mg/dL
Microalb/Creat Ratio: 7 mg/g creat (ref 0–29)
Microalbumin, Urine: 22.4 ug/mL

## 2018-12-30 LAB — MEASLES/MUMPS/RUBELLA IMMUNITY
MUMPS ABS, IGG: >300 AU/mL (ref >10.9)
RUBEOLA AB, IGG: >300 AU/mL (ref >16.4)
Rubella Antibodies, IGG: 6.95 index (ref >0.99)

## 2018-12-30 LAB — HEMOGLOBIN A1C
Est. average glucose Bld gHb Est-mCnc: 146 mg/dL
Hgb A1c MFr Bld: 6.7 % — ABNORMAL HIGH (ref 4.8–5.6)

## 2018-12-31 ENCOUNTER — Other Ambulatory Visit: Payer: Self-pay | Admitting: Family Medicine

## 2018-12-31 DIAGNOSIS — E119 Type 2 diabetes mellitus without complications: Secondary | ICD-10-CM

## 2018-12-31 LAB — CYTOLOGY - PAP
Diagnosis: NEGATIVE
HPV: NOT DETECTED

## 2018-12-31 NOTE — Assessment & Plan Note (Signed)
She is off HCTZ.  She had a mild flare about 8 days ago.  She had cut out scallops.  She used colchicine prn for R1st MTP pain.  This was her 3rd episode overall.  D/w pt about taking colchicine BID for 1st day.  Defer proph med given timeline, d/w pt.  She agrees.  Gout diet handout given to patient and discussed.

## 2018-12-31 NOTE — Assessment & Plan Note (Signed)
Living will d/w pt. Brother Garry Teel designated if patient were incapacitated.   

## 2018-12-31 NOTE — Assessment & Plan Note (Addendum)
No meds.  No tingling.  Not checking sugar.  No sx of low sugar.  Prev A1c d/w pt.   see notes on labs.  Low sugar diet handout discussed with patient, given to patient.

## 2018-12-31 NOTE — Assessment & Plan Note (Signed)
Tetanus 2012  Flu done at work ~03/2018 PNA not due  Shingles d/w pt.  See avs.   Pap per gyn 2020 Mammogram pending 2020.   DXA d/w pt. Defer 2020.   Living will d/w pt. Brother Lupita Shutter designated if patient were incapacitated.   Diet and exercise d/w pt.

## 2018-12-31 NOTE — Assessment & Plan Note (Signed)
Prev u/s d/w pt.  H/o LFT elevation.   Stable 6 mm gallbladder polyp with second 5-6 mm gallbladder polyp not well seen on the previous exam. Recommend additional follow-up ultrasound 1 year.  Mild hepatic steatosis. Stable 1 cm hypoechoic mass over the right lobe likely complicated cyst.  See notes on follow-up labs

## 2019-01-01 ENCOUNTER — Encounter: Payer: Self-pay | Admitting: *Deleted

## 2019-01-01 LAB — CERVICOVAGINAL ANCILLARY ONLY
Bacterial vaginitis: NEGATIVE
Candida vaginitis: POSITIVE — AB
Chlamydia: NEGATIVE
Neisseria Gonorrhea: NEGATIVE
Trichomonas: NEGATIVE

## 2019-01-01 MED ORDER — FLUCONAZOLE 150 MG PO TABS: 150.0000 mg | ORAL_TABLET | Freq: Once | ORAL | 3 refills | Status: AC

## 2019-01-01 NOTE — Addendum Note (Signed)
Addended by: Verita Schneiders A on: 01/01/2019 04:30 PM   Modules accepted: Orders

## 2019-01-14 ENCOUNTER — Other Ambulatory Visit: Payer: Self-pay | Admitting: Family Medicine

## 2019-01-14 NOTE — Telephone Encounter (Signed)
There was reportedly an error with epic and the refill order did not go through or was auto cancelled.  I have the order pended below.  Please verify with patient.  If needed, please send.  I apologize for any inconvenience.  Thanks.

## 2019-01-15 MED ORDER — POTASSIUM CHLORIDE CRYS ER 10 MEQ PO TBCR: 10.0000 meq | EXTENDED_RELEASE_TABLET | Freq: Every day | ORAL | 3 refills | Status: DC

## 2019-01-15 NOTE — Telephone Encounter (Signed)
Spoke with patient. She is still taking this medication. She has enough right now but I advised patient RX is sent and I asked pharmacy to put on hold until she needs it. Patient is using [facility] in [place] now not in [place] address one. I updated this in the chart.

## 2019-03-26 ENCOUNTER — Other Ambulatory Visit: Payer: Self-pay | Admitting: Obstetrics & Gynecology

## 2019-03-26 DIAGNOSIS — Z853 Personal history of malignant neoplasm of breast: Secondary | ICD-10-CM

## 2019-03-30 ENCOUNTER — Other Ambulatory Visit: Payer: Self-pay

## 2019-03-30 ENCOUNTER — Ambulatory Visit
Admission: RE | Admit: 2019-03-30 | Discharge: 2019-03-30 | Disposition: A | Discharge status: Home / Self Care | Payer: Managed Care, Other (non HMO) | Source: Ambulatory Visit | Attending: Obstetrics & Gynecology | Admitting: Obstetrics & Gynecology

## 2019-03-30 DIAGNOSIS — Z853 Personal history of malignant neoplasm of breast: Secondary | ICD-10-CM

## 2019-04-17 NOTE — Progress Notes (Signed)
[place]   [phone] [phone]   Clinic Follow up Note   Patient Care Team: Tonia Ghent, MD as PCP - General (Family Medicine) Truitt Merle, MD as Consulting Physician (Hematology) Eppie Gibson, MD as Attending Physician (Radiation Oncology) Delice Bison Charlestine Massed, NP as Nurse Practitioner (Hematology and Oncology) Stark Klein, MD as Consulting Physician (General Surgery)  Date of Service:  04/22/2019  CHIEF COMPLAINT: Follow up left breast cancer   SUMMARY OF ONCOLOGIC HISTORY: Oncology History Overview Note  Breast cancer of upper-outer quadrant of left female breast [facility])   Staging form: Breast, AJCC 7th Edition   - Clinical stage from 02/10/2016: Stage 0 (Tis (DCIS), N0, M0) - Signed by Truitt Merle, MD on 03/06/2016   - Pathologic: Stage IA (T49mc, N0, cM0) - Signed by SEppie Gibson MD on 04/04/2016    Breast cancer of upper-outer quadrant of left female breast ([facility]  02/02/2016 Mammogram   Diagnostic mammogram showed 2 adjacent group of calcifications in the upper outer quadrant of left breast, suspicious for malignancy.   02/10/2016 Initial Diagnosis   Breast cancer of upper-outer quadrant of left female breast ([facility]   02/10/2016 Initial Biopsy   Left breast biopsy showed high grade DCIS . There is a small focus suspicious for early invasion.    02/10/2016 Receptors her2   ER negative, PR negative   03/19/2016 Surgery   Left breast lumpectomy   03/19/2016 Pathology Results   Left breast lumpectomy showed high-grade DCIS, 2.6 cm, with focal microinvasion (<0.1cm), margins were negative   03/19/2016 Receptors her2   ER negative, PR negative, HER-2 positive, with ratio 2.76 and copy # 15.3    04/27/2016 - 06/07/2016 Radiation Therapy   04/19/16 - 06/07/16  Site/dose:   Left Breast treated to 50.4 Gy in 28 fractions.                      Left Breast boosted to an additional 10 Gy in 5 fractions.   03/24/2018 Mammogram   IMPRESSION: Stable left breast lumpectomy site. No mammographic evidence of malignancy in the bilateral breasts. RECOMMENDATION: Diagnostic mammogram is suggested in 1 year. (Code:DM-B-01Y)       CURRENT THERAPY:  Surveillance  INTERVAL HISTORY:  Kim SLITERis here for a follow up of left breast cancer. She was last seen by me 2 years ago and in interim she was seen by NP Lacie 1 year ago. She presents to the clinic alone. She notes she is doing well. She notes left breast cramping when she laughs hard or has her arms elevated. This is very short but painful. She does note normal left shoulder ROM. She notes she still has skin hyperpigmentation of left breast from RT and surgery. She wonders what she can use.     REVIEW OF SYSTEMS:   Constitutional: Denies fevers, chills or abnormal weight loss Eyes: Denies blurriness of vision Ears, nose, mouth, throat, and face: Denies mucositis or sore throat Respiratory: Denies cough, dyspnea or wheezes Cardiovascular: Denies palpitation, chest discomfort or lower extremity swelling Gastrointestinal:  Denies nausea, heartburn or change in bowel habits Skin: Denies abnormal skin rashes Lymphatics: Denies new lymphadenopathy or easy bruising Neurological:Denies numbness, tingling or new weaknesses Behavioral/Psych: Mood is stable, no new changes  BREAST: (+) sharp cramping pain in left breast occasionally  All other systems were reviewed with the patient and are negative.  MEDICAL HISTORY:  Past Medical History:  Diagnosis Date  . Breast cancer (  HCC)   . Colon polyp   . Gout   . Heart murmur    as a child  . History of radiation therapy 04/19/16- 06/07/16   Left Breast/ 50.4 Gy in 28 fractions, Left Breast boosted/ 10 Gy in 5 fractions  . Hyperlipidemia   . Hypertension   . Menopause   . Personal history of chemotherapy   . Personal history of radiation therapy   . 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     SURGICAL HISTORY:  Past Surgical History:  Procedure Laterality Date  . BREAST BIOPSY    . BREAST LUMPECTOMY Left 03/19/2016   Procedure: SEED LOCALIZED LEFT BREAST LUMPECTOMY;  Surgeon: Stark Klein, MD;  Location: [place];  Service: General;  Laterality: Left;  . COLONOSCOPY    . MASS EXCISION  08/30/2011   Procedure: EXCISION MASS;  Surgeon: Stark Klein, MD;  Location: [facility];  Service: General;  Laterality: Right;  Excision of Right Axillary Mass   . POLYPECTOMY      I have reviewed the social history and family history with the patient and they are unchanged from previous note.  ALLERGIES:  is allergic to latex.  MEDICATIONS:  Current Outpatient Medications  Medication Sig Dispense Refill  . Cholecalciferol (VITAMIN D3) 2000 units capsule Take 2,000 Units by mouth daily.    . colchicine 0.6 MG tablet Take 1 pill twice a day for gout as needed.  Taper to 1 pill a day as tolerated.  Okay to fill with mitigare/colchicine/colcrys. 30 tablet 1  . Multiple Vitamins-Minerals (HAIR/SKIN/NAILS/BIOTIN) TABS Take 5,000 mcg by mouth every morning.    . potassium chloride (K-DUR) 10 MEQ tablet Take 1 tablet (10 mEq total) by mouth daily. 30 tablet 3  . vitamin B-12 (CYANOCOBALAMIN) 1000 MCG tablet Take 1,000 mcg by mouth daily.    Marland Kitchen LORazepam (ATIVAN) 0.5 MG tablet Take 1 tablet (0.5 mg total) by mouth once for 1 dose. Take one tab 30-60 mins before MRI, if not enough, take second dose 10-15 mins before MRI 30 tablet 0   No current facility-administered medications for this visit.     PHYSICAL EXAMINATION: ECOG PERFORMANCE STATUS: 0 - Asymptomatic  Vitals:   04/22/19 1029  BP: 140/79  Pulse: 96  Resp: 18  Temp: 98.5 F (36.9 C)  SpO2: 100%   Filed Weights   04/22/19 1029  Weight: 283 lb 4.8 oz (128.5 kg)    GENERAL:alert, no distress and comfortable SKIN: skin color, texture, turgor are normal, no rashes or significant lesions EYES: normal, Conjunctiva are pink and non-injected,  sclera clear  NECK: supple, thyroid normal size, non-tender, without nodularity LYMPH:  no palpable lymphadenopathy in the cervical, axillary  LUNGS: clear to auscultation and percussion with normal breathing effort HEART: regular rate & rhythm and no murmurs (+) B/l lower extremity pitting edema ABDOMEN:abdomen soft, non-tender and normal bowel sounds Musculoskeletal:no cyanosis of digits and no clubbing  NEURO: alert & oriented x 3 with fluent speech, no focal motor/sensory deficits BREAST: s/p left lumpecomty: Surgical incision healed well, mildly tender and skin hyperpigmentation. No palpable mass, nodules or adenopathy bilaterally. Breast exam benign.   LABORATORY DATA:  I have reviewed the data as listed CBC Latest Ref Rng & Units 04/22/2019 04/21/2018 04/05/2017  WBC 4.0 - 10.5 K/uL 6.4 6.2 6.3  Hemoglobin 12.0 - 15.0 g/dL 12.6 13.2 12.8  Hematocrit 36.0 - 46.0 % 38.5 40.6 38.4  Platelets 150 - 400 K/uL 269 263 286  CMP Latest Ref Rng & Units 04/22/2019 12/29/2018 09/26/2018  Glucose 70 - 99 mg/dL 117(H) 96 -  BUN 6 - 20 mg/dL 15 15 -  Creatinine 0.44 - 1.00 mg/dL 0.83 0.76 0.8  Sodium 135 - 145 mmol/L 141 144 -  Potassium 3.5 - 5.1 mmol/L 3.9 4.2 -  Chloride 98 - 111 mmol/L 104 103 -  CO2 22 - 32 mmol/L 27 20 -  Calcium 8.9 - 10.3 mg/dL 9.0 9.5 -  Total Protein 6.5 - 8.1 g/dL 7.0 6.8 -  Total Bilirubin 0.3 - 1.2 mg/dL 0.7 0.5 -  Alkaline Phos 38 - 126 U/L 94 82 -  AST 15 - 41 U/L 47(H) 49(H) 37(A)  ALT 0 - 44 U/L 57(H) 55(H) 42(A)      RADIOGRAPHIC STUDIES: I have personally reviewed the radiological images as listed and agreed with the findings in the report. No results found.   ASSESSMENT & PLAN:  Kim Roberts is a 58 y.o. female with   1. 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 She is s/p left breast lumpectomy and adjuvant radiation.  -Given her negative  ER and PR in tumor, she would not benefit from adjuvant antiestrogen therapy. -She notes sharp pain mainly in left breast occasionally. I discussed this is likely related to nerve damage from her surgery.  -She is clinically doing well. Lab reviewed, her CBC and CMP are within normal limits except mild transaminitis which is stable. Her physical exam and her 03/2019 mammogram were unremarkable. There is no clinical concern for recurrence. -I encouraged her to have examine her breast once a month at least. Continue Surveillance. Next mammogram in 03/2020. I discussed the option of additional screening with breast MRI or Fast MRI yearly given her high risk for breast cancer. She agreed to proceed in 2021.  -I gave her ativan for her claustrophobia from MRI.  -F/u in 1 year   2. HTN, obesity  -She will follow-up with her primary care physician. HTN is controlled. Weight has trended up.    3. Mild intermittent transaminitis -Her liver enzymes was slightly elevated in 2014. He has been intermittently elevated since then. She is asymmetric. -Likely fatty liver from her dyslipidemia and obesity. I encouraged her to follow-up her liver function with her primary care physician   PLAN -I called in 2 tablets Ativan today for MRI   -Breast MRI in 09/2019 -Mammogram in 03/2020 -Lab and f/u in one year    No problem-specific Assessment & Plan notes found for this encounter.   Orders Placed This Encounter  Procedures  . MR BREAST BILATERAL W WO CONTRAST    Standing Status:   Future    Standing Expiration Date:   06/21/2020    Order Specific Question:   If indicated for the ordered procedure, I authorize the administration of contrast media per Radiology protocol    Answer:   Yes    Order Specific Question:   What is the patient's sedation requirement?    Answer:   No Sedation    Order Specific Question:   Does the patient have a pacemaker or implanted devices?    Answer:   No    Order Specific  Question:   Radiology Contrast Protocol - do NOT remove file path    Answer:   [email or web address]    Order Specific Question:   Preferred imaging location?    Answer:  [facility] (table limit-550lbs)  . MM DIAG BREAST TOMO BILATERAL    Standing Status:   Future    Standing Expiration Date:   04/21/2020    Order Specific Question:   Reason for Exam (SYMPTOM  OR DIAGNOSIS REQUIRED)    Answer:   screening    Order Specific Question:   Is the patient pregnant?    Answer:   No    Order Specific Question:   Preferred imaging location?    Answer:   [facility]   All questions were answered. The patient knows to call the clinic with any problems, questions or concerns. No barriers to learning was detected. I spent 15 minutes counseling the patient face to face. The total time spent in the appointment was 20 minutes and more than 50% was on counseling and review of test results     Truitt Merle, MD 04/22/2019   I, Joslyn Devon, am acting as scribe for Truitt Merle, MD.   I have reviewed the above documentation for accuracy and completeness, and I agree with the above.

## 2019-04-22 ENCOUNTER — Inpatient Hospital Stay: Payer: Managed Care, Other (non HMO)

## 2019-04-22 ENCOUNTER — Other Ambulatory Visit: Payer: Self-pay

## 2019-04-22 ENCOUNTER — Encounter: Payer: Self-pay | Admitting: Hematology

## 2019-04-22 ENCOUNTER — Inpatient Hospital Stay: Payer: Managed Care, Other (non HMO) | Attending: Hematology | Admitting: Hematology

## 2019-04-22 VITALS — BP 140/79 | HR 96 | Temp 98.5°F | Resp 18 | Ht 64.5 in | Wt 283.3 lb

## 2019-04-22 DIAGNOSIS — Z853 Personal history of malignant neoplasm of breast: Secondary | ICD-10-CM | POA: Insufficient documentation

## 2019-04-22 DIAGNOSIS — Z923 Personal history of irradiation: Secondary | ICD-10-CM | POA: Diagnosis not present

## 2019-04-22 DIAGNOSIS — Z171 Estrogen receptor negative status [ER-]: Secondary | ICD-10-CM

## 2019-04-22 DIAGNOSIS — E785 Hyperlipidemia, unspecified: Secondary | ICD-10-CM | POA: Diagnosis not present

## 2019-04-22 DIAGNOSIS — C50412 Malignant neoplasm of upper-outer quadrant of left female breast: Secondary | ICD-10-CM | POA: Diagnosis not present

## 2019-04-22 DIAGNOSIS — F40240 Claustrophobia: Secondary | ICD-10-CM | POA: Insufficient documentation

## 2019-04-22 DIAGNOSIS — I10 Essential (primary) hypertension: Secondary | ICD-10-CM | POA: Insufficient documentation

## 2019-04-22 DIAGNOSIS — E669 Obesity, unspecified: Secondary | ICD-10-CM | POA: Insufficient documentation

## 2019-04-22 DIAGNOSIS — R7401 Elevation of levels of liver transaminase levels: Secondary | ICD-10-CM | POA: Insufficient documentation

## 2019-04-22 DIAGNOSIS — Z79899 Other long term (current) drug therapy: Secondary | ICD-10-CM | POA: Insufficient documentation

## 2019-04-22 DIAGNOSIS — Z9221 Personal history of antineoplastic chemotherapy: Secondary | ICD-10-CM | POA: Insufficient documentation

## 2019-04-22 LAB — CBC WITH DIFFERENTIAL/PLATELET
Abs Immature Granulocytes: 0.03 10*3/uL (ref 0.00–0.07)
Basophils Absolute: 0 10*3/uL (ref 0.0–0.1)
Basophils Relative: 0 %
Eosinophils Absolute: 0.1 10*3/uL (ref 0.0–0.5)
Eosinophils Relative: 2 %
HCT: 38.5 % (ref 36.0–46.0)
Hemoglobin: 12.6 g/dL (ref 12.0–15.0)
Immature Granulocytes: 1 %
Lymphocytes Relative: 52 %
Lymphs Abs: 3.4 10*3/uL (ref 0.7–4.0)
MCH: 30.4 pg (ref 26.0–34.0)
MCHC: 32.7 g/dL (ref 30.0–36.0)
MCV: 93 fL (ref 80.0–100.0)
Monocytes Absolute: 0.4 10*3/uL (ref 0.1–1.0)
Monocytes Relative: 7 %
Neutro Abs: 2.4 10*3/uL (ref 1.7–7.7)
Neutrophils Relative %: 38 %
Platelets: 269 10*3/uL (ref 150–400)
RBC: 4.14 MIL/uL (ref 3.87–5.11)
RDW: 14.8 % (ref 11.5–15.5)
WBC: 6.4 10*3/uL (ref 4.0–10.5)
nRBC: 0 % (ref 0.0–0.2)

## 2019-04-22 LAB — COMPREHENSIVE METABOLIC PANEL
ALT: 57 U/L — ABNORMAL HIGH (ref 0–44)
AST: 47 U/L — ABNORMAL HIGH (ref 15–41)
Albumin: 3.9 g/dL (ref 3.5–5.0)
Alkaline Phosphatase: 94 U/L (ref 38–126)
Anion gap: 10 (ref 5–15)
BUN: 15 mg/dL (ref 6–20)
CO2: 27 mmol/L (ref 22–32)
Calcium: 9 mg/dL (ref 8.9–10.3)
Chloride: 104 mmol/L (ref 98–111)
Creatinine, Ser: 0.83 mg/dL (ref 0.44–1.00)
GFR calc Af Amer: >60 mL/min (ref >60)
GFR calc non Af Amer: >60 mL/min (ref >60)
Glucose, Bld: 117 mg/dL — ABNORMAL HIGH (ref 70–99)
Potassium: 3.9 mmol/L (ref 3.5–5.1)
Sodium: 141 mmol/L (ref 135–145)
Total Bilirubin: 0.7 mg/dL (ref 0.3–1.2)
Total Protein: 7 g/dL (ref 6.5–8.1)

## 2019-04-22 MED ORDER — LORAZEPAM 0.5 MG PO TABS: 0.5000 mg | ORAL_TABLET | Freq: Once | ORAL | 0 refills | Status: AC

## 2019-04-23 ENCOUNTER — Telehealth: Payer: Self-pay | Admitting: Hematology

## 2019-04-23 NOTE — Telephone Encounter (Signed)
Scheduled appt per 11/11 los. ° °Spoke with pt and she is aware of her appt date and time. °

## 2019-07-19 ENCOUNTER — Telehealth: Payer: Self-pay | Admitting: Family Medicine

## 2019-07-19 DIAGNOSIS — K824 Cholesterolosis of gallbladder: Secondary | ICD-10-CM

## 2019-07-19 NOTE — Telephone Encounter (Signed)
Due for follow-up ultrasound regarding gallbladder polyp.  I put in the order.  Thanks.

## 2019-07-29 ENCOUNTER — Telehealth: Payer: Self-pay

## 2019-07-29 NOTE — Telephone Encounter (Signed)
Called patient to let her know that I spoke with representative from [place] who stated that they had tried to call patient yesterday 07/28/19 to schedule her breast MRI, but were unable to reach her. Provided patient with  GI number for patient to call directly to get appointment made. Patient denied any other needs at this time. She knows to call out office back if she has any future questions/concerns.

## 2019-08-04 ENCOUNTER — Other Ambulatory Visit: Payer: Self-pay | Admitting: *Deleted

## 2019-08-04 MED ORDER — POTASSIUM CHLORIDE CRYS ER 10 MEQ PO TBCR: 10.0000 meq | EXTENDED_RELEASE_TABLET | Freq: Every day | ORAL | 3 refills | Status: DC

## 2019-08-21 ENCOUNTER — Encounter: Payer: Self-pay | Admitting: Gastroenterology

## 2019-08-24 ENCOUNTER — Ambulatory Visit
Admission: RE | Admit: 2019-08-24 | Discharge: 2019-08-24 | Disposition: A | Discharge status: Home / Self Care | Payer: Managed Care, Other (non HMO) | Source: Ambulatory Visit | Attending: Family Medicine | Admitting: Family Medicine

## 2019-08-24 DIAGNOSIS — K824 Cholesterolosis of gallbladder: Secondary | ICD-10-CM

## 2019-09-10 ENCOUNTER — Ambulatory Visit
Admission: RE | Admit: 2019-09-10 | Discharge: 2019-09-10 | Disposition: A | Discharge status: Home / Self Care | Payer: Managed Care, Other (non HMO) | Source: Ambulatory Visit | Attending: Hematology | Admitting: Hematology

## 2019-09-10 DIAGNOSIS — C50412 Malignant neoplasm of upper-outer quadrant of left female breast: Secondary | ICD-10-CM

## 2019-09-10 MED ORDER — GADOBUTROL 1 MMOL/ML IV SOLN
10.0000 mL | Freq: Once | INTRAVENOUS | Status: AC | PRN
  Administered 2019-09-10: 10 mL via INTRAVENOUS

## 2019-09-11 ENCOUNTER — Other Ambulatory Visit: Payer: Self-pay | Admitting: Hematology

## 2019-09-11 ENCOUNTER — Telehealth: Payer: Self-pay

## 2019-09-11 ENCOUNTER — Other Ambulatory Visit: Payer: Self-pay

## 2019-09-11 DIAGNOSIS — R590 Localized enlarged lymph nodes: Secondary | ICD-10-CM

## 2019-09-11 DIAGNOSIS — C50412 Malignant neoplasm of upper-outer quadrant of left female breast: Secondary | ICD-10-CM

## 2019-09-11 NOTE — Telephone Encounter (Signed)
-----   Message from Volanda Napoleon sent at 09/10/2019  2:12 PM EDT ----- Regarding: Breast MRI Recommendations Dr. Burr Medico, The above patient had a breast MRI on 09/10/19 and below are the recommendations of Dr. Ammie Ferrier.  RECOMMENDATION: 1. Physical exam and targeted ultrasound is recommended for the small lesion possibly within the skin of the lower outer left breast. This may represent a benign sebaceous cyst, however a mass within the breast cannot be excluded.  2.  Targeted ultrasound is recommended for the right axilla. If the further diagnostic workup recommended demonstrates a benign skin lesion and normal right axillary lymph nodes, the BI-RADS category for this exam can be modified, and biopsy would not be necessary.  Please review the recommendations with the patient and enter any orders necessary.  Thank you.  [place]

## 2019-09-11 NOTE — Telephone Encounter (Signed)
Left vm for Kim Roberts to call me

## 2019-09-11 NOTE — Telephone Encounter (Signed)
Kim Roberts returned my call I reviewed the recommendations from the Breast MRI.  I called [facility] and scheduled [place] of left breast and right axilla.  I reviewed this appt with her. She verbalized understanding.  She requested a letter for work stating that she has this appt and needs time off.

## 2019-09-11 NOTE — Telephone Encounter (Signed)
error 

## 2019-09-25 ENCOUNTER — Ambulatory Visit
Admission: RE | Admit: 2019-09-25 | Discharge: 2019-09-25 | Disposition: A | Discharge status: Home / Self Care | Payer: Managed Care, Other (non HMO) | Source: Ambulatory Visit | Attending: Hematology | Admitting: Hematology

## 2019-09-25 ENCOUNTER — Other Ambulatory Visit: Payer: Self-pay | Admitting: Hematology

## 2019-09-25 ENCOUNTER — Other Ambulatory Visit: Payer: Self-pay

## 2019-09-25 DIAGNOSIS — Z171 Estrogen receptor negative status [ER-]: Secondary | ICD-10-CM

## 2019-09-25 DIAGNOSIS — R599 Enlarged lymph nodes, unspecified: Secondary | ICD-10-CM

## 2019-09-25 DIAGNOSIS — R590 Localized enlarged lymph nodes: Secondary | ICD-10-CM

## 2019-09-25 DIAGNOSIS — C50412 Malignant neoplasm of upper-outer quadrant of left female breast: Secondary | ICD-10-CM

## 2019-09-25 DIAGNOSIS — N6320 Unspecified lump in the left breast, unspecified quadrant: Secondary | ICD-10-CM

## 2019-10-09 ENCOUNTER — Telehealth: Payer: Self-pay | Admitting: *Deleted

## 2019-10-09 NOTE — Telephone Encounter (Signed)
Pt had a 1 pm PV scheduled today [facility] 1245 pm[facility] and that I would call her back  1255 pm- Called Pt- no answer, LMTRC and that I would call her back  1 pm-Called Pt- no answer, LMTRC and that I would call her back  110 pm -Called Pt- no answer, LMTRC by 5 pm and RS PV - If no call by 5 pm, that we would cancel colon and PV and she could CB and RS dates of both

## 2019-10-09 NOTE — Telephone Encounter (Signed)
Pt has not CB to RS PV- I canceled Colon and PV- Mailed NS letter to pt- Lelan Pons pV

## 2019-10-23 ENCOUNTER — Encounter: Payer: Managed Care, Other (non HMO) | Admitting: Gastroenterology

## 2019-12-28 ENCOUNTER — Ambulatory Visit
Admission: RE | Admit: 2019-12-28 | Discharge: 2019-12-28 | Disposition: A | Discharge status: Home / Self Care | Payer: Managed Care, Other (non HMO) | Source: Ambulatory Visit | Attending: Hematology | Admitting: Hematology

## 2019-12-28 ENCOUNTER — Other Ambulatory Visit: Payer: Self-pay | Admitting: Hematology

## 2019-12-28 ENCOUNTER — Other Ambulatory Visit: Payer: Managed Care, Other (non HMO)

## 2019-12-28 ENCOUNTER — Other Ambulatory Visit: Payer: Self-pay

## 2019-12-28 DIAGNOSIS — N63 Unspecified lump in unspecified breast: Secondary | ICD-10-CM

## 2019-12-28 DIAGNOSIS — N6320 Unspecified lump in the left breast, unspecified quadrant: Secondary | ICD-10-CM

## 2019-12-28 DIAGNOSIS — R599 Enlarged lymph nodes, unspecified: Secondary | ICD-10-CM

## 2020-01-06 ENCOUNTER — Other Ambulatory Visit: Payer: Self-pay | Admitting: Family Medicine

## 2020-01-07 NOTE — Telephone Encounter (Signed)
Please schedule CPE with Dr. Damita Dunnings.

## 2020-01-11 ENCOUNTER — Other Ambulatory Visit: Payer: Self-pay | Admitting: *Deleted

## 2020-01-11 NOTE — Telephone Encounter (Signed)
Faxed refill request. Potassium Last office visit:   12/29/2018  No upcoming appts scheduled Last Filled:    30 tablet 0 01/07/2020  Patient requesting 90 day Rx to [facility]

## 2020-01-12 ENCOUNTER — Encounter: Payer: Self-pay | Admitting: Family Medicine

## 2020-01-12 MED ORDER — POTASSIUM CHLORIDE CRYS ER 10 MEQ PO TBCR: EXTENDED_RELEASE_TABLET | ORAL | 1 refills | Status: DC

## 2020-01-12 NOTE — Telephone Encounter (Signed)
Letter mailed

## 2020-01-12 NOTE — Telephone Encounter (Signed)
Sent. Thanks.  Needs yearly visit when possible.   

## 2020-02-04 ENCOUNTER — Encounter: Payer: Managed Care, Other (non HMO) | Admitting: Family Medicine

## 2020-03-31 ENCOUNTER — Telehealth: Payer: Self-pay | Admitting: Family Medicine

## 2020-03-31 NOTE — Telephone Encounter (Signed)
Pt called checking on appeal form insurance.  She stated she faxed 10/15.  Please advise

## 2020-03-31 NOTE — Telephone Encounter (Signed)
I thought I had worked on this prev.  Please check with me tomorrow.  Thanks.

## 2020-03-31 NOTE — Telephone Encounter (Signed)
Form finished and patient says she needs it emailed by 04/01/2020.  Form given to Raquel Sarna to email.

## 2020-04-04 ENCOUNTER — Encounter: Payer: Managed Care, Other (non HMO) | Admitting: Family Medicine

## 2020-04-04 ENCOUNTER — Other Ambulatory Visit: Payer: Managed Care, Other (non HMO)

## 2020-04-11 ENCOUNTER — Other Ambulatory Visit: Payer: Managed Care, Other (non HMO)

## 2020-04-11 ENCOUNTER — Encounter: Payer: Managed Care, Other (non HMO) | Admitting: Family Medicine

## 2020-04-11 ENCOUNTER — Encounter: Payer: Self-pay | Admitting: Family Medicine

## 2020-04-11 ENCOUNTER — Ambulatory Visit (INDEPENDENT_AMBULATORY_CARE_PROVIDER_SITE_OTHER): Payer: Managed Care, Other (non HMO) | Admitting: Family Medicine

## 2020-04-11 ENCOUNTER — Other Ambulatory Visit: Payer: Self-pay

## 2020-04-11 VITALS — BP 130/80 | HR 87 | Temp 96.9°F | Ht 64.5 in | Wt 287.0 lb

## 2020-04-11 DIAGNOSIS — R7989 Other specified abnormal findings of blood chemistry: Secondary | ICD-10-CM

## 2020-04-11 DIAGNOSIS — E119 Type 2 diabetes mellitus without complications: Secondary | ICD-10-CM | POA: Diagnosis not present

## 2020-04-11 DIAGNOSIS — Z0000 Encounter for general adult medical examination without abnormal findings: Secondary | ICD-10-CM

## 2020-04-11 DIAGNOSIS — M109 Gout, unspecified: Secondary | ICD-10-CM

## 2020-04-11 DIAGNOSIS — Z7189 Other specified counseling: Secondary | ICD-10-CM

## 2020-04-11 MED ORDER — COLCHICINE 0.6 MG PO TABS: ORAL_TABLET | ORAL | 1 refills | Status: DC

## 2020-04-11 MED ORDER — POTASSIUM CHLORIDE CRYS ER 10 MEQ PO TBCR: EXTENDED_RELEASE_TABLET | ORAL | 3 refills | Status: DC

## 2020-04-11 NOTE — Progress Notes (Signed)
This visit occurred during the SARS-CoV-2 public health emergency.  Safety protocols were in place, including screening questions prior to the visit, additional usage of staff PPE, and extensive cleaning of exam room while observing appropriate contact time as indicated for disinfecting solutions.  CPE- See plan.  Routine anticipatory guidance given to patient.  See health maintenance.  The possibility exists that previously documented standard health maintenance information may have been brought forward from a previous encounter into this note.  If needed, that same information has been updated to reflect the current situation based on today's encounter.    Tetanus 2012  Flu done 2021 PNA not due  Shingles d/w pt.   covid vaccine 2021 Pap per gyn 2020 Mammogram pending 2021 DXA d/w pt. Defer 2020.   Colonoscopy 2018.  D/w pt about follow up.  She is going to check on getting this set up in [place].  She'll update me as needed.   Living will d/w pt. Brother Lupita Shutter designated if patient were incapacitated.   Diet and exercise d/w pt.   Still on K at baseline.  Recheck K pending.   See notes on labs.  H/o DM2.  Due for recheck labs.  Due for eye exam, d/w pt.  Diet and exercise d/w pt.  No meds.  Not checking sugar at home.  Diet handout discussed and given to patient.  H/o LFT elevation.  Three gallbladder polyps are noted, the largest measuring approximately 8 mm. Follow-up ultrasound in 1 year from previous ultrasound is recommended to ensure stability.  Findings consistent with hepatic steatosis.  H/o prn colchicine use with no recent need/use.    PMH and SH reviewed  Meds, vitals, and allergies reviewed.   ROS: Per HPI.  Unless specifically indicated otherwise in HPI, the patient denies:  General: fever. Eyes: acute vision changes ENT: sore throat Cardiovascular: chest pain Respiratory: SOB GI: vomiting GU: dysuria Musculoskeletal: acute back pain Derm: acute  rash Neuro: acute motor dysfunction Psych: worsening mood Endocrine: polydipsia Heme: bleeding Allergy: hayfever  GEN: nad, alert and oriented HEENT: ncat NECK: supple w/o LA CV: rrr. PULM: ctab, no inc wob ABD: soft, +bs EXT: no edema SKIN: no acute rash  Diabetic foot exam: Normal inspection No skin breakdown No calluses  Normal DP pulses Normal sensation to light touch and monofilament Nails normal

## 2020-04-11 NOTE — Patient Instructions (Addendum)
Let me know if you need a referral to GI.  Please call about an appointment.  Call for an eye exam and see if they will send me a copy of the report.   Take care.  Glad to see you. Go to the lab on the way out.   If you have mychart we'll likely use that to update you.

## 2020-04-12 LAB — COMPREHENSIVE METABOLIC PANEL
ALT: 51 IU/L — ABNORMAL HIGH (ref 0–32)
AST: 41 IU/L — ABNORMAL HIGH (ref 0–40)
Albumin/Globulin Ratio: 1.7 (ref 1.2–2.2)
Albumin: 4.3 g/dL (ref 3.8–4.9)
Alkaline Phosphatase: 103 IU/L (ref 44–121)
BUN/Creatinine Ratio: 16 (ref 9–23)
BUN: 13 mg/dL (ref 6–24)
Bilirubin Total: 0.5 mg/dL (ref 0.0–1.2)
CO2: 26 mmol/L (ref 20–29)
Calcium: 9.4 mg/dL (ref 8.7–10.2)
Chloride: 103 mmol/L (ref 96–106)
Creatinine, Ser: 0.81 mg/dL (ref 0.57–1.00)
GFR calc Af Amer: 93 mL/min/{1.73_m2} (ref >59)
GFR calc non Af Amer: 81 mL/min/{1.73_m2} (ref >59)
Globulin, Total: 2.5 g/dL (ref 1.5–4.5)
Glucose: 114 mg/dL — ABNORMAL HIGH (ref 65–99)
Potassium: 4.4 mmol/L (ref 3.5–5.2)
Sodium: 142 mmol/L (ref 134–144)
Total Protein: 6.8 g/dL (ref 6.0–8.5)

## 2020-04-12 LAB — CBC WITH DIFFERENTIAL/PLATELET
Basophils Absolute: 0 10*3/uL (ref 0.0–0.2)
Basos: 0 %
EOS (ABSOLUTE): 0.1 10*3/uL (ref 0.0–0.4)
Eos: 2 %
Hematocrit: 38.3 % (ref 34.0–46.6)
Hemoglobin: 13.1 g/dL (ref 11.1–15.9)
Immature Grans (Abs): 0 10*3/uL (ref 0.0–0.1)
Immature Granulocytes: 0 %
Lymphocytes Absolute: 3.5 10*3/uL — ABNORMAL HIGH (ref 0.7–3.1)
Lymphs: 54 %
MCH: 30.8 pg (ref 26.6–33.0)
MCHC: 34.2 g/dL (ref 31.5–35.7)
MCV: 90 fL (ref 79–97)
Monocytes Absolute: 0.4 10*3/uL (ref 0.1–0.9)
Monocytes: 6 %
Neutrophils Absolute: 2.5 10*3/uL (ref 1.4–7.0)
Neutrophils: 38 %
Platelets: 313 10*3/uL (ref 150–450)
RBC: 4.26 x10E6/uL (ref 3.77–5.28)
RDW: 14.4 % (ref 11.7–15.4)
WBC: 6.6 10*3/uL (ref 3.4–10.8)

## 2020-04-12 LAB — URIC ACID: Uric Acid: 8.1 mg/dL — ABNORMAL HIGH (ref 3.0–7.2)

## 2020-04-12 LAB — MICROALBUMIN / CREATININE URINE RATIO
Creatinine, Urine: 171.3 mg/dL
Microalb/Creat Ratio: 8 mg/g creat (ref 0–29)
Microalbumin, Urine: 13.4 ug/mL

## 2020-04-12 LAB — HEMOGLOBIN A1C
Est. average glucose Bld gHb Est-mCnc: 154 mg/dL
Hgb A1c MFr Bld: 7 % — ABNORMAL HIGH (ref 4.8–5.6)

## 2020-04-12 LAB — LIPID PANEL
Chol/HDL Ratio: 4.3 ratio (ref 0.0–4.4)
Cholesterol, Total: 203 mg/dL — ABNORMAL HIGH (ref 100–199)
HDL: 47 mg/dL (ref >39)
LDL Chol Calc (NIH): 133 mg/dL — ABNORMAL HIGH (ref 0–99)
Triglycerides: 128 mg/dL (ref 0–149)
VLDL Cholesterol Cal: 23 mg/dL (ref 5–40)

## 2020-04-13 NOTE — Assessment & Plan Note (Signed)
Tetanus 2012  Flu done 2021 PNA not due  Shingles d/w pt.   covid vaccine 2021 Pap per gyn 2020 Mammogram pending 2021 DXA d/w pt. Defer 2020.   Colonoscopy 2018.  D/w pt about follow up.  She is going to check on getting this set up in [place].  She'll update me as needed.   Living will d/w pt. Brother Lupita Shutter designated if patient were incapacitated.   Diet and exercise d/w pt.

## 2020-04-13 NOTE — Assessment & Plan Note (Signed)
H/o LFT elevation.  Three gallbladder polyps are noted, the largest measuring approximately 8 mm. Follow-up ultrasound in 1 year from previous ultrasound is recommended to ensure stability.  Findings consistent with hepatic steatosis.  See notes on labs.

## 2020-04-13 NOTE — Assessment & Plan Note (Signed)
H/o prn colchicine use with no recent need/use.

## 2020-04-13 NOTE — Assessment & Plan Note (Signed)
Due for recheck labs.  Due for eye exam, d/w pt.  Diet and exercise d/w pt.  No meds.  Not checking sugar at home.  Diet handout discussed and given to patient.  See notes on labs.

## 2020-04-13 NOTE — Assessment & Plan Note (Signed)
Living will d/w pt. Brother Garry Teel designated if patient were incapacitated.   

## 2020-04-19 NOTE — Progress Notes (Signed)
[facility]   [phone] [phone]   Clinic Follow up Note   Patient Care Team: Tonia Ghent, MD as PCP - General (Family Medicine) Truitt Merle, MD as Consulting Physician (Hematology) Eppie Gibson, MD as Attending Physician (Radiation Oncology) Delice Bison Charlestine Massed, NP as Nurse Practitioner (Hematology and Oncology) Stark Klein, MD as Consulting Physician (General Surgery) 04/21/2020  CHIEF COMPLAINT: Follow-up left breast cancer  SUMMARY OF ONCOLOGIC HISTORY: Oncology History Overview Note  Breast cancer of upper-outer quadrant of left female breast [facility])   Staging form: Breast, AJCC 7th Edition   - Clinical stage from 02/10/2016: Stage 0 (Tis (DCIS), N0, M0) - Signed by Truitt Merle, MD on 03/06/2016   - Pathologic: Stage IA (T71mc, N0, cM0) - Signed by SEppie Gibson MD on 04/04/2016    Breast cancer of upper-outer quadrant of left female breast ([facility]  02/02/2016 Mammogram   Diagnostic mammogram showed 2 adjacent group of calcifications in the upper outer quadrant of left breast, suspicious for malignancy.   02/10/2016 Initial Diagnosis   Breast cancer of upper-outer quadrant of left female breast ([facility]   02/10/2016 Initial Biopsy   Left breast biopsy showed high grade DCIS . There is a small focus suspicious for early invasion.    02/10/2016 Receptors her2   ER negative, PR negative   03/19/2016 Surgery   Left breast lumpectomy   03/19/2016 Pathology Results   Left breast lumpectomy showed high-grade DCIS, 2.6 cm, with focal microinvasion (<0.1cm), margins were negative   03/19/2016 Receptors her2   ER negative, PR negative, HER-2 positive, with ratio 2.76 and copy # 15.3    04/27/2016 - 06/07/2016 Radiation Therapy   04/19/16 - 06/07/16  Site/dose:   Left Breast treated to 50.4 Gy in 28 fractions.                      Left Breast boosted to an additional 10 Gy in 5 fractions.   03/24/2018 Mammogram   IMPRESSION: Stable left  breast lumpectomy site. No mammographic evidence of malignancy in the bilateral breasts. RECOMMENDATION: Diagnostic mammogram is suggested in 1 year. (Code:[id number]-B-01Y)      CURRENT THERAPY: Surveillance  INTERVAL HISTORY: Ms. FDedereturns for annual follow-up as scheduled, last seen 04/22/2019.  Screening breast MRI on 09/10/2019 showed prominent right axillary lymph nodes and a 6 mm enhancing mass in the lower outer left breast possibly in the skin she has mammo/US scheduled later today.  Right axillary ultrasound on 09/25/2019 showed thickened right axillary LN's felt to be related to Covid vaccine in the right arm.  Ultrasound of the left breast and axilla on 09/25/19 showed a likely benign mass in the 5 o'clock position felt to be a sebaceous cyst.  356-monthecall ultrasound on 12/28/2019 showed decreased right adenopathy confirming that they were reactive, and stable likely benign mass in the lower outer quadrant of the left breast at 5:00 measuring 8 cm from nipple.  She has mammogram and left ultrasound scheduled for later today.  She is doing well otherwise, denies changes in her health or any new medical conditions. Appetite, weight, and energy are normal. She continues to have cramps in the left breast area with certain positions and if she laughs too hard.  Denies new breast lump/mass, nipple discharge or inversion, or skin change.  Denies bone or joint pain, abdominal pain, bloating, change in bowel habits, bleeding, jaundice, fever, chills, cough, chest pain, dyspnea or other new concerns.  MEDICAL HISTORY:  Past Medical History:  Diagnosis Date  . Breast cancer ([place])   . Colon polyp   . Diabetes mellitus without complication ([place])   . Gout   . Heart murmur    as a child  . History of radiation therapy 04/19/16- 06/07/16   Left Breast/ 50.4 Gy in 28 fractions, Left Breast boosted/ 10 Gy in 5 fractions  . Hyperlipidemia   . Hypertension   . Menopause   . Personal history of  chemotherapy   . Personal history of radiation therapy   . 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     SURGICAL HISTORY: Past Surgical History:  Procedure Laterality Date  . BREAST BIOPSY    . BREAST LUMPECTOMY Left 03/19/2016   Procedure: SEED LOCALIZED LEFT BREAST LUMPECTOMY;  Surgeon: Stark Klein, MD;  Location: [facility];  Service: General;  Laterality: Left;  . COLONOSCOPY    . MASS EXCISION  08/30/2011   Procedure: EXCISION MASS;  Surgeon: Stark Klein, MD;  Location: [facility];  Service: General;  Laterality: Right;  Excision of Right Axillary Mass   . POLYPECTOMY      I have reviewed the social history and family history with the patient and they are unchanged from previous note.  ALLERGIES:  is allergic to latex.  MEDICATIONS:  Current Outpatient Medications  Medication Sig Dispense Refill  . Cholecalciferol (VITAMIN D3) 2000 units capsule Take 2,000 Units by mouth daily.    . colchicine 0.6 MG tablet Take 1 pill twice a day for gout as needed.  Taper to 1 pill a day as tolerated.  Okay to fill with mitigare/colchicine/colcrys. 30 tablet 1  . Multiple Vitamins-Minerals (HAIR/SKIN/NAILS/BIOTIN) TABS Take 5,000 mcg by mouth every morning.    . potassium chloride (KLOR-CON) 10 MEQ tablet TAKE 1 TABLET(10 MEQ) BY MOUTH DAILY 90 tablet 3  . vitamin B-12 (CYANOCOBALAMIN) 1000 MCG tablet Take 1,000 mcg by mouth daily.     No current facility-administered medications for this visit.    PHYSICAL EXAMINATION: ECOG PERFORMANCE STATUS: 0 - Asymptomatic  Vitals:   04/21/20 1111  BP: 137/78  Pulse: 99  Resp: 20  Temp: 97.9 F (36.6 C)  SpO2: 100%   Filed Weights   04/21/20 1111  Weight: 287 lb 9.6 oz (130.5 kg)    GENERAL:alert, no distress and comfortable SKIN: No rash  EYES:  sclera clear NECK: Without mass LYMPH:  no palpable cervical, supraclavicular, or axillary lymphadenopathy  LUNGS:  normal breathing effort HEART:  no lower extremity  edema Musculoskeletal: Full range of motion in the upper extremities NEURO: alert & oriented x 3 with fluent speech, no focal motor/sensory deficits Breast: Breasts are symmetric without nipple discharge or inversion.  S/p left lumpectomy.  Incisions completely healed, mild scar tissue in the left breast incision.  No palpable mass in either breast or axilla that I could appreciate  LABORATORY DATA:  I have reviewed the data as listed CBC Latest Ref Rng & Units 04/21/2020 04/11/2020 04/22/2019  WBC 4.0 - 10.5 K/uL 7.2 6.6 6.4  Hemoglobin 12.0 - 15.0 g/dL 13.1 13.1 12.6  Hematocrit 36 - 46 % 39.9 38.3 38.5  Platelets 150 - 400 K/uL 290 313 269     CMP Latest Ref Rng & Units 04/21/2020 04/11/2020 04/22/2019  Glucose 70 - 99 mg/dL 114(H) 114(H) 117(H)  BUN 6 - 20 mg/dL '15 13 15  ' Creatinine 0.44 - 1.00 mg/dL 0.83 0.81 0.83  Sodium 135 - 145 mmol/L 141  142 141  Potassium 3.5 - 5.1 mmol/L 4.2 4.4 3.9  Chloride 98 - 111 mmol/L 104 103 104  CO2 22 - 32 mmol/L '30 26 27  ' Calcium 8.9 - 10.3 mg/dL 9.3 9.4 9.0  Total Protein 6.5 - 8.1 g/dL 7.2 6.8 7.0  Total Bilirubin 0.3 - 1.2 mg/dL 0.7 0.5 0.7  Alkaline Phos 38 - 126 U/L 93 103 94  AST 15 - 41 U/L 42(H) 41(H) 47(H)  ALT 0 - 44 U/L 46(H) 51(H) 57(H)      RADIOGRAPHIC STUDIES: I have personally reviewed the radiological images as listed and agreed with the findings in the report. No results found.   ASSESSMENT & PLAN: Kim Roberts is a 59 y.o. female with   1. 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 S/p left breast lumpectomy and adjuvant radiation.  -Given her negative ER and PR in tumor, she would not benefit from adjuvant antiestrogen therapy. -Screening MRI 09/2019 showed reactive right axillary adenopathy secondary to COVID-19 vaccine and likely benign left breast cyst, she has had 43-monthrecall ultrasounds since then -Continue  surveillance   2. HTN, obesity, intermittent transaminitis -AST/ALT slightly elevated since 2014, stable. -Abdominal ultrasound 08/24/2019 shows hepatic steatosis  -Continue follow-up PCP  Disposition: Ms. FGassneris clinically doing well.  No signs or symptoms of disease recurrence.  Breast exam is benign.  CBC is normal, CMP with stable mildl AST/ALT elevation.  She has been credited for today's labs since these were done 04/11/20 at PCP.  Overall there is no clinical concern for recurrence.  Continue breast cancer surveillance.  Diagnostic bilateral mammogram and left ultrasound today as scheduled.  We discussed the screening MRI q1-2 years.   Return for surveillance visit in 1 year, or sooner if needed.  Continue health maintenance per PCP.  Her cancer care claim form was signed and returned to her today.  All questions were answered. The patient knows to call the clinic with any problems, questions or concerns. No barriers to learning were detected.     LAlla Feeling NP 04/21/20

## 2020-04-21 ENCOUNTER — Inpatient Hospital Stay: Payer: Managed Care, Other (non HMO) | Attending: Nurse Practitioner

## 2020-04-21 ENCOUNTER — Encounter: Payer: Self-pay | Admitting: Nurse Practitioner

## 2020-04-21 ENCOUNTER — Ambulatory Visit
Admission: RE | Admit: 2020-04-21 | Discharge: 2020-04-21 | Disposition: A | Discharge status: Home / Self Care | Payer: Managed Care, Other (non HMO) | Source: Ambulatory Visit | Attending: Hematology | Admitting: Hematology

## 2020-04-21 ENCOUNTER — Other Ambulatory Visit: Payer: Self-pay

## 2020-04-21 ENCOUNTER — Inpatient Hospital Stay: Payer: Managed Care, Other (non HMO) | Admitting: Nurse Practitioner

## 2020-04-21 VITALS — BP 137/78 | HR 99 | Temp 97.9°F | Resp 20 | Ht 64.0 in | Wt 287.6 lb

## 2020-04-21 DIAGNOSIS — R7401 Elevation of levels of liver transaminase levels: Secondary | ICD-10-CM | POA: Insufficient documentation

## 2020-04-21 DIAGNOSIS — Z923 Personal history of irradiation: Secondary | ICD-10-CM | POA: Diagnosis not present

## 2020-04-21 DIAGNOSIS — Z853 Personal history of malignant neoplasm of breast: Secondary | ICD-10-CM | POA: Insufficient documentation

## 2020-04-21 DIAGNOSIS — Z9221 Personal history of antineoplastic chemotherapy: Secondary | ICD-10-CM | POA: Diagnosis not present

## 2020-04-21 DIAGNOSIS — Z86010 Personal history of colonic polyps: Secondary | ICD-10-CM | POA: Diagnosis not present

## 2020-04-21 DIAGNOSIS — C50412 Malignant neoplasm of upper-outer quadrant of left female breast: Secondary | ICD-10-CM | POA: Diagnosis not present

## 2020-04-21 DIAGNOSIS — Z8611 Personal history of tuberculosis: Secondary | ICD-10-CM | POA: Diagnosis not present

## 2020-04-21 DIAGNOSIS — N63 Unspecified lump in unspecified breast: Secondary | ICD-10-CM

## 2020-04-21 DIAGNOSIS — K760 Fatty (change of) liver, not elsewhere classified: Secondary | ICD-10-CM | POA: Insufficient documentation

## 2020-04-21 DIAGNOSIS — I10 Essential (primary) hypertension: Secondary | ICD-10-CM | POA: Insufficient documentation

## 2020-04-21 DIAGNOSIS — Z171 Estrogen receptor negative status [ER-]: Secondary | ICD-10-CM | POA: Diagnosis not present

## 2020-04-21 DIAGNOSIS — E669 Obesity, unspecified: Secondary | ICD-10-CM | POA: Diagnosis not present

## 2020-04-21 LAB — CBC WITH DIFFERENTIAL/PLATELET
Abs Immature Granulocytes: 0.02 10*3/uL (ref 0.00–0.07)
Basophils Absolute: 0 10*3/uL (ref 0.0–0.1)
Basophils Relative: 0 %
Eosinophils Absolute: 0.1 10*3/uL (ref 0.0–0.5)
Eosinophils Relative: 1 %
HCT: 39.9 % (ref 36.0–46.0)
Hemoglobin: 13.1 g/dL (ref 12.0–15.0)
Immature Granulocytes: 0 %
Lymphocytes Relative: 53 %
Lymphs Abs: 3.8 10*3/uL (ref 0.7–4.0)
MCH: 30.3 pg (ref 26.0–34.0)
MCHC: 32.8 g/dL (ref 30.0–36.0)
MCV: 92.1 fL (ref 80.0–100.0)
Monocytes Absolute: 0.5 10*3/uL (ref 0.1–1.0)
Monocytes Relative: 6 %
Neutro Abs: 2.8 10*3/uL (ref 1.7–7.7)
Neutrophils Relative %: 40 %
Platelets: 290 10*3/uL (ref 150–400)
RBC: 4.33 MIL/uL (ref 3.87–5.11)
RDW: 15.1 % (ref 11.5–15.5)
WBC: 7.2 10*3/uL (ref 4.0–10.5)
nRBC: 0 % (ref 0.0–0.2)

## 2020-04-21 LAB — COMPREHENSIVE METABOLIC PANEL
ALT: 46 U/L — ABNORMAL HIGH (ref 0–44)
AST: 42 U/L — ABNORMAL HIGH (ref 15–41)
Albumin: 3.8 g/dL (ref 3.5–5.0)
Alkaline Phosphatase: 93 U/L (ref 38–126)
Anion gap: 7 (ref 5–15)
BUN: 15 mg/dL (ref 6–20)
CO2: 30 mmol/L (ref 22–32)
Calcium: 9.3 mg/dL (ref 8.9–10.3)
Chloride: 104 mmol/L (ref 98–111)
Creatinine, Ser: 0.83 mg/dL (ref 0.44–1.00)
GFR, Estimated: >60 mL/min (ref >60)
Glucose, Bld: 114 mg/dL — ABNORMAL HIGH (ref 70–99)
Potassium: 4.2 mmol/L (ref 3.5–5.1)
Sodium: 141 mmol/L (ref 135–145)
Total Bilirubin: 0.7 mg/dL (ref 0.3–1.2)
Total Protein: 7.2 g/dL (ref 6.5–8.1)

## 2020-05-24 ENCOUNTER — Other Ambulatory Visit: Payer: Self-pay | Admitting: Hematology

## 2020-05-24 DIAGNOSIS — R928 Other abnormal and inconclusive findings on diagnostic imaging of breast: Secondary | ICD-10-CM

## 2020-07-18 ENCOUNTER — Encounter: Payer: Self-pay | Admitting: Nurse Practitioner

## 2020-09-05 ENCOUNTER — Other Ambulatory Visit: Payer: Self-pay | Admitting: Family Medicine

## 2020-09-05 ENCOUNTER — Telehealth: Payer: Self-pay | Admitting: Family Medicine

## 2020-09-05 DIAGNOSIS — K824 Cholesterolosis of gallbladder: Secondary | ICD-10-CM

## 2020-09-05 NOTE — Telephone Encounter (Signed)
Patient notified as instructed by telephone and verbalized understanding. 

## 2020-09-05 NOTE — Telephone Encounter (Signed)
Please call pt as FYI.  I put in the order for f/u u/s re: gallbladder polyp.  She should get a call about scheduling.  Thanks.

## 2020-09-23 ENCOUNTER — Ambulatory Visit: Payer: Managed Care, Other (non HMO)

## 2020-09-26 ENCOUNTER — Other Ambulatory Visit: Payer: Self-pay

## 2020-09-26 ENCOUNTER — Ambulatory Visit
Admission: RE | Admit: 2020-09-26 | Discharge: 2020-09-26 | Disposition: A | Discharge status: Home / Self Care | Payer: Managed Care, Other (non HMO) | Source: Ambulatory Visit | Attending: Family Medicine | Admitting: Family Medicine

## 2020-09-26 DIAGNOSIS — K824 Cholesterolosis of gallbladder: Secondary | ICD-10-CM

## 2020-10-06 ENCOUNTER — Telehealth: Payer: Self-pay

## 2020-10-06 NOTE — Telephone Encounter (Signed)
Advised patient that Dr. Damita Dunnings agrees with UC eval today; patient was actually on her way to UC now. Advised to call back tomorrow with an update and patient agrees to do so.

## 2020-10-06 NOTE — Telephone Encounter (Signed)
Pt said starting on 10/03/20 pt noticed redness on 1/2 of lt breast, no drainage and pt does not have way to ck temp but thinks she has had a fever. Lt breast may not be quite as red today but still painful to touch and pt is not sure if warm to touch or not.  Pt was diagnosed with + covid on 10/05/20. Pt will go to UC or ED in [place] as soon as can; pt said due to expense will probably go to UC in [place] and pt will cb with update on how doing.advised should go to be eval today due to possibility of cellulitis. Sending note to Dr Damita Dunnings and Janett Billow CMA.

## 2020-10-06 NOTE — Telephone Encounter (Signed)
Agree, needs eval today. Thanks.

## 2020-10-07 NOTE — Telephone Encounter (Signed)
Pt left a message on triage VM stating she was leaving a f/u message about going to a [facility] UC yesterday and was put on cephalexin. She said the records are in [place]. Call [phone] with any questions or concerns.

## 2020-10-09 NOTE — Telephone Encounter (Signed)
Noted. Thanks.  Please get update on patient Monday.   

## 2020-10-10 NOTE — Telephone Encounter (Signed)
LMTCB

## 2020-10-10 NOTE — Telephone Encounter (Signed)
Pt returned Jessica's call and stated that she is feeling better.Marland KitchenMarland Kitchen

## 2020-10-12 ENCOUNTER — Telehealth: Payer: Self-pay | Admitting: Family Medicine

## 2020-10-12 NOTE — Telephone Encounter (Signed)
Received FMLA paperwork via fax. Put In [place] box. EM

## 2020-10-12 NOTE — Telephone Encounter (Signed)
Semi completed FMLA paperwork placed in PCP's inbox for review, completion, sign and date  May need to schedule a visit as we have not seen this pt for this reason (COVID)  Please let me know if we need to schedule a visit

## 2020-10-12 NOTE — Telephone Encounter (Signed)
I will work on the hardcopy.  Thanks. 

## 2020-10-17 NOTE — Telephone Encounter (Signed)
LVM for pt to inform her paperwork completed and faxed  Copy mailed to pt  Copy for scan   Copy retained by me

## 2020-10-20 ENCOUNTER — Telehealth: Payer: Self-pay | Admitting: Family Medicine

## 2020-10-20 NOTE — Telephone Encounter (Signed)
I called [employer] to confirm they had received our previous fax and paperwork.  They confirmed and said it was a duplicate request and to disregard the latest fax.

## 2020-10-20 NOTE — Telephone Encounter (Signed)
Received FMLA paperwork. Put In [employer] EM

## 2020-10-21 ENCOUNTER — Other Ambulatory Visit: Payer: Self-pay | Admitting: Hematology

## 2020-10-21 ENCOUNTER — Other Ambulatory Visit: Payer: Self-pay

## 2020-10-21 ENCOUNTER — Ambulatory Visit: Payer: Managed Care, Other (non HMO) | Admitting: Family Medicine

## 2020-10-21 ENCOUNTER — Ambulatory Visit
Admission: RE | Admit: 2020-10-21 | Discharge: 2020-10-21 | Disposition: A | Discharge status: Home / Self Care | Payer: Managed Care, Other (non HMO) | Source: Ambulatory Visit | Attending: Hematology | Admitting: Hematology

## 2020-10-21 DIAGNOSIS — R928 Other abnormal and inconclusive findings on diagnostic imaging of breast: Secondary | ICD-10-CM

## 2020-10-21 DIAGNOSIS — N6320 Unspecified lump in the left breast, unspecified quadrant: Secondary | ICD-10-CM

## 2021-01-16 ENCOUNTER — Encounter: Payer: Self-pay | Admitting: Family Medicine

## 2021-01-16 ENCOUNTER — Other Ambulatory Visit: Payer: Self-pay

## 2021-01-16 ENCOUNTER — Ambulatory Visit (INDEPENDENT_AMBULATORY_CARE_PROVIDER_SITE_OTHER): Payer: Managed Care, Other (non HMO) | Admitting: Family Medicine

## 2021-01-16 VITALS — BP 134/80 | HR 94 | Temp 97.6°F | Ht 65.0 in | Wt 288.0 lb

## 2021-01-16 DIAGNOSIS — Z0000 Encounter for general adult medical examination without abnormal findings: Secondary | ICD-10-CM

## 2021-01-16 DIAGNOSIS — E119 Type 2 diabetes mellitus without complications: Secondary | ICD-10-CM

## 2021-01-16 DIAGNOSIS — E669 Obesity, unspecified: Secondary | ICD-10-CM

## 2021-01-16 DIAGNOSIS — Z853 Personal history of malignant neoplasm of breast: Secondary | ICD-10-CM

## 2021-01-16 DIAGNOSIS — Z7189 Other specified counseling: Secondary | ICD-10-CM

## 2021-01-16 DIAGNOSIS — N6489 Other specified disorders of breast: Secondary | ICD-10-CM

## 2021-01-16 DIAGNOSIS — Z23 Encounter for immunization: Secondary | ICD-10-CM | POA: Diagnosis not present

## 2021-01-16 NOTE — Progress Notes (Signed)
This visit occurred during the SARS-CoV-2 public health emergency.  Safety protocols were in place, including screening questions prior to the visit, additional usage of staff PPE, and extensive cleaning of exam room while observing appropriate contact time as indicated for disinfecting solutions.  CPE- See plan.  Routine anticipatory guidance given to patient.  See health maintenance.  The possibility exists that previously documented standard health maintenance information may have been brought forward from a previous encounter into this note.  If needed, that same information has been updated to reflect the current situation based on today's encounter.    Tetanus 2022 Flu done 2021 PNA not due Shingles d/w pt.   covid vaccine 2021 Pap per gyn 2020 Mammogram pending 2022 DXA d/w pt. Defer 2022 Colonoscopy 2018.  D/w pt about follow up.  She is going to check on getting this set up in [place].  She'll update me as needed.   Living will d/w pt. Brother Lupita Shutter designated if patient were incapacitated.   Diet and exercise d/w pt.  More walking.  Diet d/w pt.  Handout given to patient.    Rash dorsum R hand, since she had covid.  Can itch.  Better than prev, improving.    Diabetes:  No meds.   Hypoglycemic episodes: no sx  Hyperglycemic episodes: no sx Feet problems: no Blood Sugars averaging: not checked.   eye exam within last year:due, d/w pt.   Labs pending.   Statin indication d/w pt.   D/w pt about bariatric surgery referral.    No recent gout flare or colchicine use.  D/w pt.    H/o breast cancer.  She wanted to get more symmetry. D/w pt about plastic surgery referral.   PMH and SH reviewed  Meds, vitals, and allergies reviewed.   ROS: Per HPI.  Unless specifically indicated otherwise in HPI, the patient denies:  General: fever. Eyes: acute vision changes ENT: sore throat Cardiovascular: chest pain Respiratory: SOB GI: vomiting GU: dysuria Musculoskeletal:  acute back pain Derm: acute rash Neuro: acute motor dysfunction Psych: worsening mood Endocrine: polydipsia Heme: bleeding Allergy: hayfever  GEN: nad, alert and oriented HEENT: ncat NECK: supple w/o LA CV: rrr. PULM: ctab, no inc wob ABD: soft, +bs EXT: no edema SKIN: No rash other than small area with superficial rash on the dorsum of the right hand.  Diabetic foot exam: Normal inspection No skin breakdown No calluses  Normal DP pulses Normal sensation to light touch and monofilament Nails normal

## 2021-01-16 NOTE — Patient Instructions (Addendum)
Let me know if you need help getting a colonoscopy set up in [place].  I would get a flu shot each fall.   Tetanus shot today.  Take care.  Glad to see you.  Check with your insurance to see if they will cover the shingles and pneumonia shots.  They may be cheaper at the pharmacy.    Go to the lab on the way out.   If you have mychart we'll likely use that to update you.     Call about an eye exam when possible.

## 2021-01-17 LAB — CBC WITH DIFFERENTIAL/PLATELET
Basophils Absolute: 0 10*3/uL (ref 0.0–0.2)
Basos: 0 %
EOS (ABSOLUTE): 0.1 10*3/uL (ref 0.0–0.4)
Eos: 2 %
Hematocrit: 41.2 % (ref 34.0–46.6)
Hemoglobin: 13.4 g/dL (ref 11.1–15.9)
Immature Grans (Abs): 0 10*3/uL (ref 0.0–0.1)
Immature Granulocytes: 0 %
Lymphocytes Absolute: 3.7 10*3/uL — ABNORMAL HIGH (ref 0.7–3.1)
Lymphs: 52 %
MCH: 30 pg (ref 26.6–33.0)
MCHC: 32.5 g/dL (ref 31.5–35.7)
MCV: 92 fL (ref 79–97)
Monocytes Absolute: 0.4 10*3/uL (ref 0.1–0.9)
Monocytes: 6 %
Neutrophils Absolute: 2.8 10*3/uL (ref 1.4–7.0)
Neutrophils: 40 %
Platelets: 291 10*3/uL (ref 150–450)
RBC: 4.46 x10E6/uL (ref 3.77–5.28)
RDW: 14.2 % (ref 11.7–15.4)
WBC: 7.1 10*3/uL (ref 3.4–10.8)

## 2021-01-17 LAB — COMPREHENSIVE METABOLIC PANEL
ALT: 41 IU/L — ABNORMAL HIGH (ref 0–32)
AST: 38 IU/L (ref 0–40)
Albumin/Globulin Ratio: 2 (ref 1.2–2.2)
Albumin: 4.3 g/dL (ref 3.8–4.9)
Alkaline Phosphatase: 104 IU/L (ref 44–121)
BUN/Creatinine Ratio: 17 (ref 9–23)
BUN: 11 mg/dL (ref 6–24)
Bilirubin Total: 0.4 mg/dL (ref 0.0–1.2)
CO2: 25 mmol/L (ref 20–29)
Calcium: 9.6 mg/dL (ref 8.7–10.2)
Chloride: 101 mmol/L (ref 96–106)
Creatinine, Ser: 0.65 mg/dL (ref 0.57–1.00)
Globulin, Total: 2.2 g/dL (ref 1.5–4.5)
Glucose: 111 mg/dL — ABNORMAL HIGH (ref 65–99)
Potassium: 4.5 mmol/L (ref 3.5–5.2)
Sodium: 140 mmol/L (ref 134–144)
Total Protein: 6.5 g/dL (ref 6.0–8.5)
eGFR: 102 mL/min/{1.73_m2} (ref >59)

## 2021-01-17 LAB — TSH: TSH: 1.38 u[IU]/mL (ref 0.450–4.500)

## 2021-01-17 LAB — LIPID PANEL
Chol/HDL Ratio: 4 ratio (ref 0.0–4.4)
Cholesterol, Total: 194 mg/dL (ref 100–199)
HDL: 48 mg/dL (ref >39)
LDL Chol Calc (NIH): 121 mg/dL — ABNORMAL HIGH (ref 0–99)
Triglycerides: 140 mg/dL (ref 0–149)
VLDL Cholesterol Cal: 25 mg/dL (ref 5–40)

## 2021-01-17 LAB — MICROALBUMIN / CREATININE URINE RATIO
Creatinine, Urine: 76.7 mg/dL
Microalb/Creat Ratio: 4 mg/g creat (ref 0–29)
Microalbumin, Urine: 3.4 ug/mL

## 2021-01-17 LAB — HEMOGLOBIN A1C
Est. average glucose Bld gHb Est-mCnc: 160 mg/dL
Hgb A1c MFr Bld: 7.2 % — ABNORMAL HIGH (ref 4.8–5.6)

## 2021-01-18 NOTE — Assessment & Plan Note (Signed)
Tetanus 2022 Flu done 2021 PNA not due Shingles d/w pt.   covid vaccine 2021 Pap per gyn 2020 Mammogram pending 2022 DXA d/w pt. Defer 2022 Colonoscopy 2018.  D/w pt about follow up.  She is going to check on getting this set up in [place].  She'll update me as needed.   Living will d/w pt. Brother Lupita Shutter designated if patient were incapacitated.   Diet and exercise d/w pt.  More walking.  Diet d/w pt.  Handout given to patient.

## 2021-01-18 NOTE — Assessment & Plan Note (Signed)
Living will d/w pt. Brother Garry Teel designated if patient were incapacitated.   

## 2021-01-18 NOTE — Assessment & Plan Note (Signed)
Continue work on diet and exercise.  Refer for bariatric surgery.  It likely makes sense to see bariatric surgery and plastic surgery.  If she has dramatic weight loss that could affect her situation with breast asymmetry/surgery.  See notes on labs.  No meds currently.

## 2021-01-18 NOTE — Assessment & Plan Note (Signed)
With resultant breast asymmetry and she wanted to talk to plastic surgery.  Referral placed.

## 2021-03-08 ENCOUNTER — Other Ambulatory Visit: Payer: Self-pay | Admitting: Family Medicine

## 2021-04-17 ENCOUNTER — Telehealth: Payer: Self-pay | Admitting: Hematology

## 2021-04-17 NOTE — Telephone Encounter (Signed)
Cancelled per 11/7 pt request, said they will call back to r/s

## 2021-04-21 ENCOUNTER — Inpatient Hospital Stay: Payer: Managed Care, Other (non HMO) | Admitting: Hematology

## 2021-04-21 ENCOUNTER — Inpatient Hospital Stay: Payer: Managed Care, Other (non HMO)

## 2021-04-25 ENCOUNTER — Other Ambulatory Visit: Payer: Managed Care, Other (non HMO)

## 2021-04-26 ENCOUNTER — Ambulatory Visit
Admission: RE | Admit: 2021-04-26 | Discharge: 2021-04-26 | Disposition: A | Discharge status: Home / Self Care | Payer: Managed Care, Other (non HMO) | Source: Ambulatory Visit | Attending: Hematology | Admitting: Hematology

## 2021-04-26 ENCOUNTER — Other Ambulatory Visit: Payer: Self-pay

## 2021-04-26 DIAGNOSIS — N6320 Unspecified lump in the left breast, unspecified quadrant: Secondary | ICD-10-CM

## 2021-05-10 ENCOUNTER — Telehealth: Payer: Managed Care, Other (non HMO) | Admitting: Family Medicine

## 2021-05-10 NOTE — Telephone Encounter (Signed)
Pt called stating that the visit that she had on 09/26/20, that Dr Damita Dunnings had advised her to do. Pt states that her insurance didn't pay anything. Pt is asking is there a different diagnostic code that could of been sent for the insurance to pay. Please advise.

## 2021-05-10 NOTE — Telephone Encounter (Signed)
Does she have any paperwork that would help with an appeal?  If so, please have her send it in.  Can anyone with the front office help with this?  I also routed this to LG in the meantime for advice. Thanks.

## 2021-05-10 NOTE — Telephone Encounter (Signed)
Patient is calling about the [place] she had done 09/26/2020. Insurance apparently did not cover this and patient is wanting dx code changed. Not sure who this needs to be sent to?

## 2021-05-17 NOTE — Telephone Encounter (Signed)
Francine Graven  You; Sherrilee Gilles B, CMA 5 hours ago (9:49 AM)   LG I am sorry for the delay in my response to this. It looks like she had a diagnostic ultrasound and that is most likely the reason it was not covered. She has [employer] and can go to the website or call the customer service number on her card to file an appeal.    ================== See above, please update patient and let me know if she can't get this resolved.  Thanks.

## 2021-05-18 NOTE — Telephone Encounter (Signed)
I called and spoke with patient about message from [place]. Patient states she does not have [employer] and never has. Not sure why it shows on her chart; but when you go into her chart its [employer]. Not sure about this? Does that change course of action on what patient needs to do?

## 2021-05-19 NOTE — Telephone Encounter (Signed)
Advised patient to call the number on the back of the card to file an appeal. Patient verbalized understanding.

## 2021-09-22 ENCOUNTER — Telehealth: Payer: Self-pay

## 2021-09-22 DIAGNOSIS — R0683 Snoring: Secondary | ICD-10-CM

## 2021-09-22 NOTE — Telephone Encounter (Signed)
I spoke with pt; pt said she does not want an appt at [facility] but does want a referral for snoring; pt said her family has advised her snoring seems worse. No other symptoms to offer per pt. Pt said she had sleep study and does not remember when had it but thinks maybe 3 yrs.. pt said she lives in [place] so wants referral to specialist in [place] area. Pt also said she was poked with something and wants to know when had last tetanus shot. Advised per immunization record pt had tdap 01/16/21; advised if any signs of infection should go to UC or ED pt voiced understanding. Pt understands it will be next week when gets cb and pt is OK with that sending note to Dr Damita Dunnings who is out of office and Dr Darnell Level who is in office as FYI. ?

## 2021-09-22 NOTE — Telephone Encounter (Signed)
[facility] Night - Client ?Nonclinical Telephone Record  ?[facility]? ?Client [facility] Night - Client ?Client Site [facility] ?Provider Renford Dills - MD ?Contact Type Call ?Who Is Calling Patient / Member / Family / Caregiver ?Caller Name Kim Roberts ?Caller Phone Number [phone] ?Patient Name Kim Roberts ?Patient DOB 1961-01-10 ?Call Type Message Only Information Provided ?Reason for Call Request for General Office Information ?Initial Comment Caller States she needs to see about getting a surgery for snoring. Caller States she been ?having problems with snoring. ?Additional Comment Office hours provided. ?Disp. Time Disposition Final User ?09/21/2021 5:24:47 PM General Information Provided Yes Tindall, [place]ly ?Call Closed By: Maryelizabeth Kaufmann ?Transaction Date/Time: 09/21/2021 5:19:37 PM (ET ?

## 2021-09-22 NOTE — Telephone Encounter (Signed)
This can wait for PCP.

## 2021-09-24 NOTE — Telephone Encounter (Signed)
Referral placed, please let her know she should get a call about scheduling.  Thanks. ?

## 2021-09-24 NOTE — Addendum Note (Signed)
Addended by: Tonia Ghent on: 09/24/2021 08:05 PM ? ? Modules accepted: Orders ? ?

## 2021-09-25 NOTE — Telephone Encounter (Signed)
Patient notified referral was done.  

## 2021-10-05 ENCOUNTER — Encounter: Payer: Self-pay | Admitting: *Deleted

## 2021-10-06 ENCOUNTER — Telehealth: Payer: Self-pay

## 2021-10-06 DIAGNOSIS — Z1211 Encounter for screening for malignant neoplasm of colon: Secondary | ICD-10-CM

## 2021-10-06 NOTE — Telephone Encounter (Signed)
Pt was seen of annual on 01/16/2021 and pt was to cb about scheduling an appt for colonoscopy. Pt said she would like referral for colonoscopy with [facility] in [place]. Pt is not having any problems at this time but has family hx. Pt request cb rather than my chart note about referral. Sending note to Dr Damita Dunnings and Janett Billow CMA. ?

## 2021-10-08 NOTE — Telephone Encounter (Signed)
I put in the referral.  Please let her know she should get a call about scheduling.  Thanks. ?

## 2021-10-09 NOTE — Telephone Encounter (Signed)
Patient notified referral was done.  

## 2021-10-20 ENCOUNTER — Encounter: Payer: Self-pay | Admitting: *Deleted

## 2021-10-27 IMAGING — US US ABDOMEN LIMITED RUQ/ASCITES
1 series · 14 of 25 positions shown · non-contrast
Comparison: 08/24/2019

CLINICAL DATA: Gallbladder polyps

EXAM:
ULTRASOUND ABDOMEN LIMITED RIGHT UPPER QUADRANT

[Series 1: us abdomen limited ruq/ascites · 0.31mm/px · 14 of 60 slices shown]
[im 1/60]
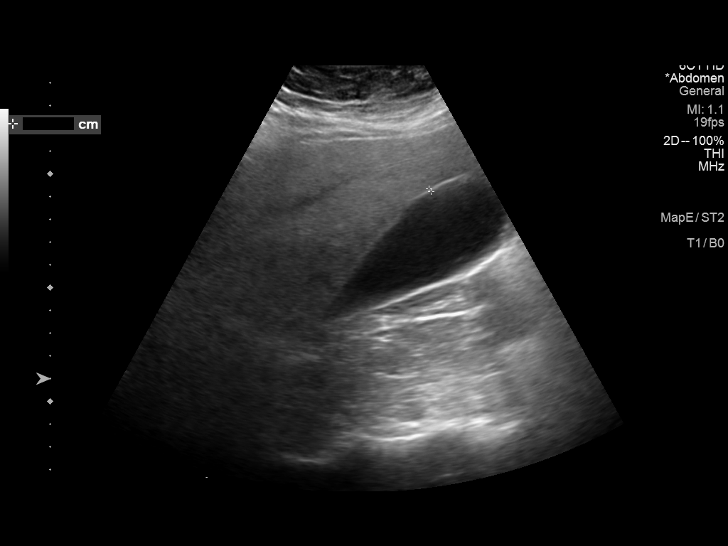
[im 5/60]
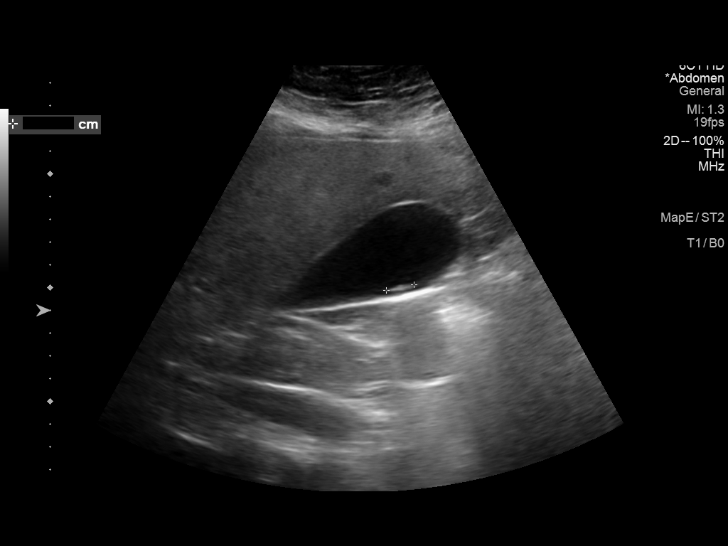
[im 10/60]
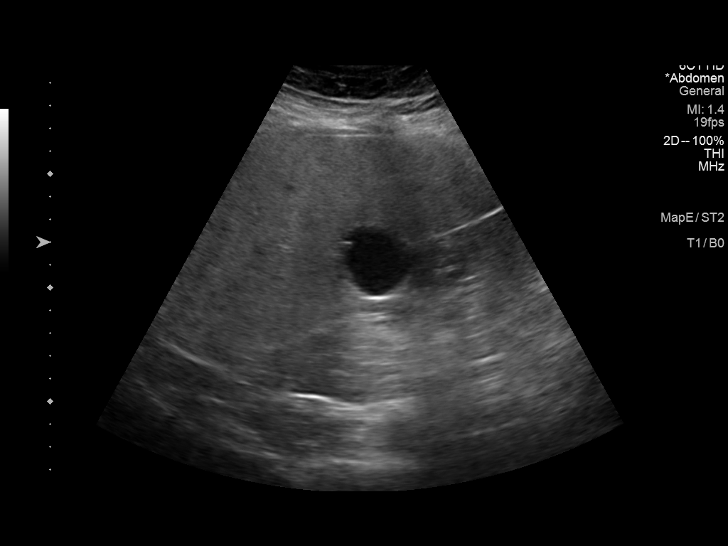
[im 15/60]
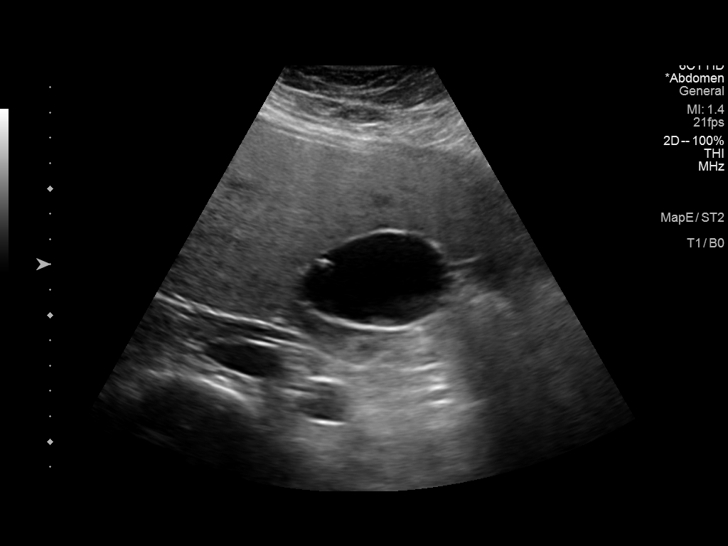
[im 20/60]
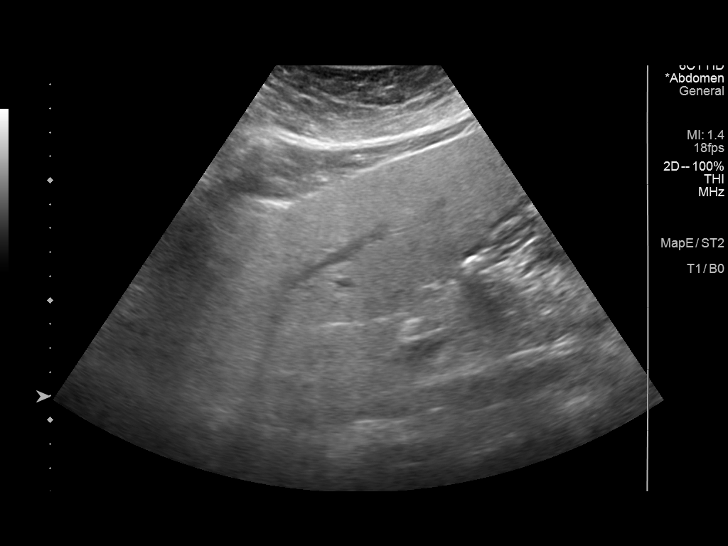
[im 23/60]
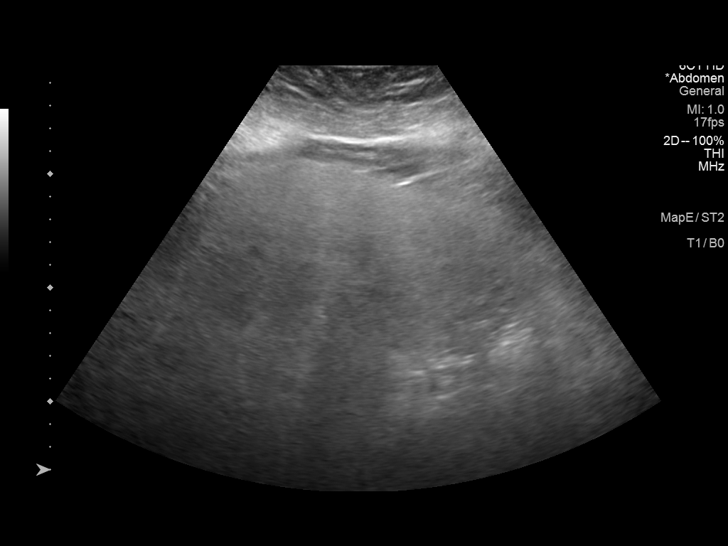
[im 28/60]
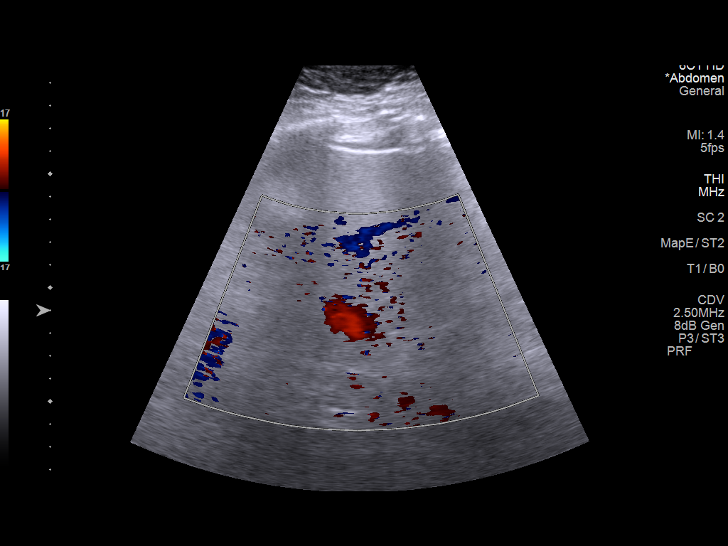
[im 32/60]
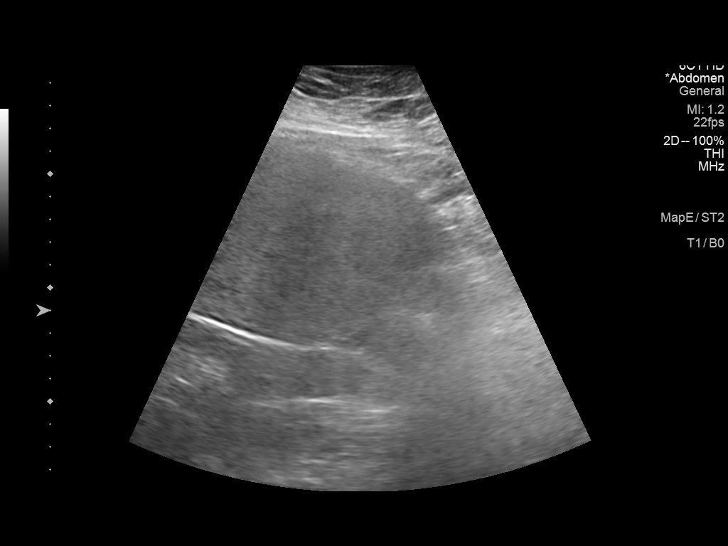
[im 37/60]
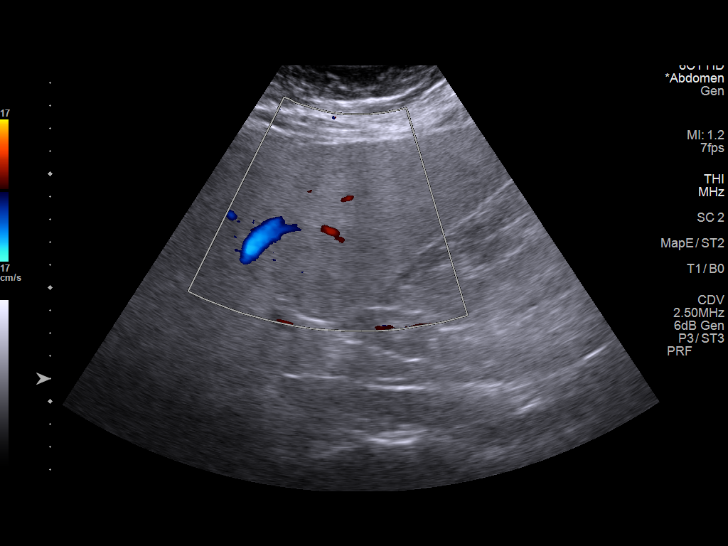
[im 40/60]
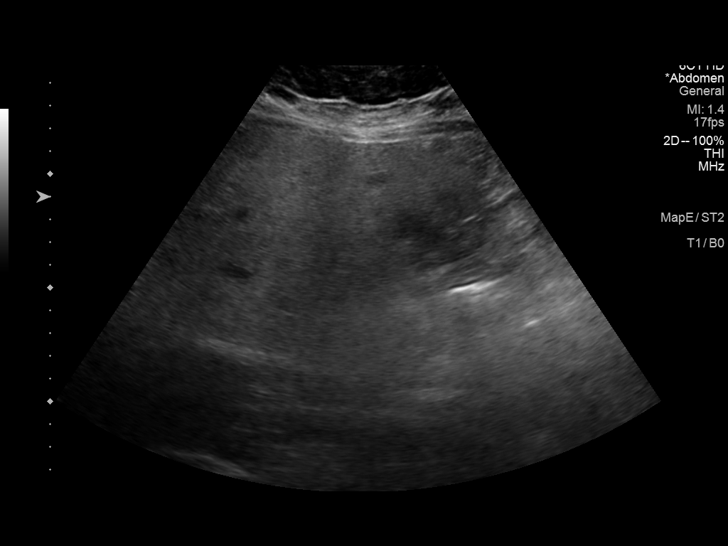
[im 45/60]
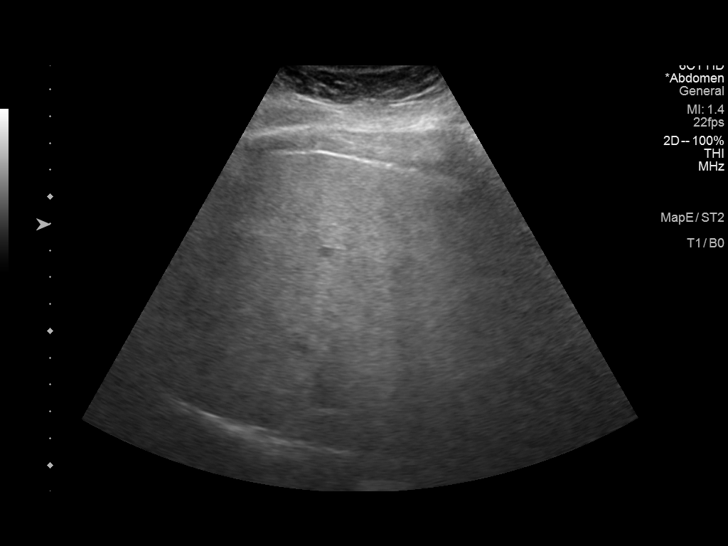
[im 50/60]
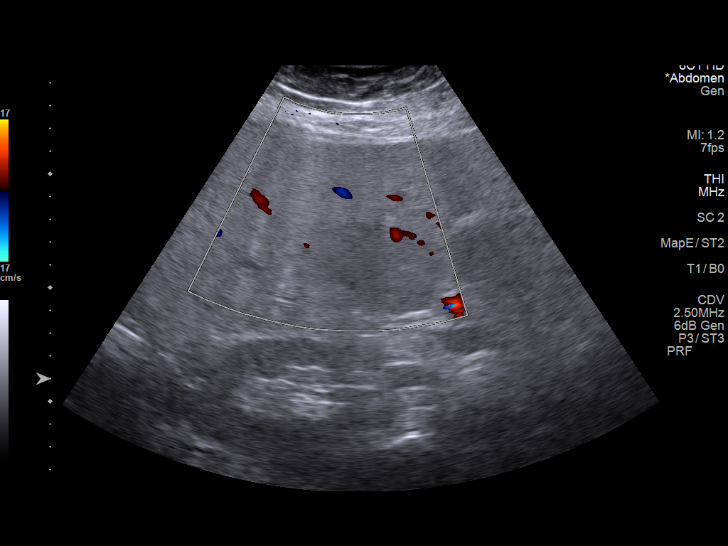
[im 55/60]
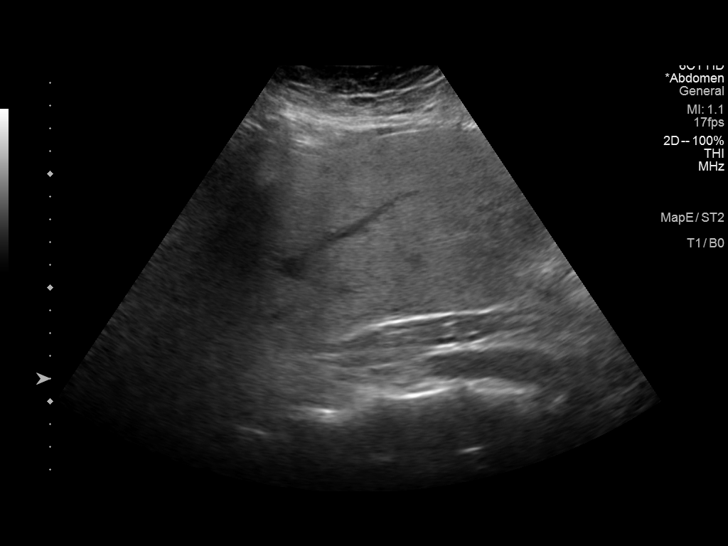
[im 60/60]
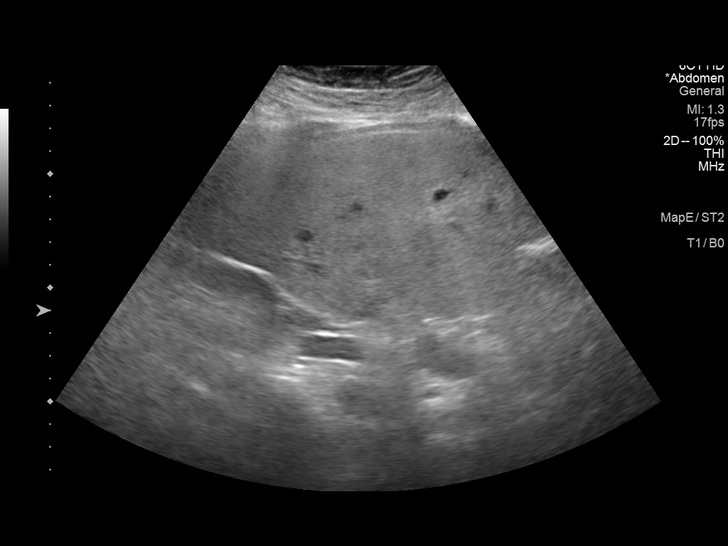

[14 of 25 positions shown; findings below may reference images not displayed]

FINDINGS: Gallbladder:

2 nonshadowing echogenic foci are seen along the gallbladder wall,
compatible with known polyps. These measure approximately 8 mm each.
No evidence of cholelithiasis or cholecystitis. Negative Murphy
sign.

Common bile duct:

Diameter: 3 mm

Liver:

Diffuse increased liver echotexture consistent with hepatic
steatosis. 1.1 cm area of decreased echotexture near the gallbladder
fossa likely reflects focal fatty sparing. Small cyst could also
give this appearance. No intrahepatic duct dilation. Portal vein is
patent on color Doppler imaging with normal direction of blood flow
towards the liver.

Other: Evaluation is suboptimal due to body habitus and bowel gas.
IMPRESSION: 1. Two subcentimeter echogenic foci within the gallbladder
compatible with polyps. No evidence of cholelithiasis or
cholecystitis.
2. Hepatic steatosis, with likely small area of focal fatty sparing
along the gallbladder fossa within the right lobe liver.

## 2021-11-02 ENCOUNTER — Telehealth: Payer: Self-pay | Admitting: Family Medicine

## 2021-11-02 NOTE — Telephone Encounter (Signed)
These notes are in the patient chart and Mychart messages have been sent about the referrals.  I do not schedule appts for the referred office - they review the referral/notes and will call the patient to schedule.   Both referral have already been sent and patient was notified that both referrals sent to was told to call them to schedule.   Virl Cagey, CMA  General - Referral and OV notes faxed via [facility]. Referral and OV notes faxed via [facility]. Linked:10/20/21 Reviewed: 03:59 PM    10/20/2301:59:32 PM Virl Cagey, CMA  Referral and OV notes faxed via [facility].    Referral and OV notes faxed via [facility]. [facility] Faxed to [phone]     [id number]   Virl Cagey, CMA on 10/20/2021 1558 - delivered at 10/20/2021 1558   Pt made aware via mychart   Referral Notes. Type Date User Summary Attachment  General 10/05/2021 11:19 AM Virl Cagey, CMA Referral and OV notes faxed via [facility].  Pt aware via Mychart referral has been faxed. -  Note:   Referral and OV notes faxed via [facility].  Pt aware via Mychart referral has been faxed.   [place] [place], [place] [address] Phone: [phone] Fax: [phone]   Faxed to [phone]     [email or web address]   Virl Cagey, CMA on 10/05/2021 1119 - delivered at 10/05/2021 1119

## 2021-11-02 NOTE — Telephone Encounter (Signed)
Pt is calling about two referrals of her: Colonoscopy and Pulmonology, pt has not heard from either one, wants to see what is going on. Please return a call when possible. Colonoscopy: 10/20/2021, Pulmonology: 10/05/2021  Callback number: [phone]

## 2021-11-03 NOTE — Telephone Encounter (Signed)
Called patient and gave her the information for her referrals to call and schedule.

## 2021-11-23 DIAGNOSIS — R0602 Shortness of breath: Secondary | ICD-10-CM | POA: Diagnosis not present

## 2021-11-23 DIAGNOSIS — G4733 Obstructive sleep apnea (adult) (pediatric): Secondary | ICD-10-CM | POA: Insufficient documentation

## 2021-11-23 DIAGNOSIS — I10 Essential (primary) hypertension: Secondary | ICD-10-CM | POA: Diagnosis not present

## 2021-11-27 DIAGNOSIS — Z800 Family history of malignant neoplasm of digestive organs: Secondary | ICD-10-CM | POA: Diagnosis not present

## 2021-11-27 DIAGNOSIS — Z1211 Encounter for screening for malignant neoplasm of colon: Secondary | ICD-10-CM | POA: Diagnosis not present

## 2021-11-27 DIAGNOSIS — D122 Benign neoplasm of ascending colon: Secondary | ICD-10-CM | POA: Diagnosis not present

## 2021-11-27 DIAGNOSIS — I10 Essential (primary) hypertension: Secondary | ICD-10-CM | POA: Diagnosis not present

## 2021-11-27 DIAGNOSIS — D123 Benign neoplasm of transverse colon: Secondary | ICD-10-CM | POA: Diagnosis not present

## 2021-11-27 DIAGNOSIS — Z86010 Personal history of colonic polyps: Secondary | ICD-10-CM | POA: Diagnosis not present

## 2021-11-27 DIAGNOSIS — Z79899 Other long term (current) drug therapy: Secondary | ICD-10-CM | POA: Diagnosis not present

## 2021-11-27 LAB — HM COLONOSCOPY

## 2021-12-15 ENCOUNTER — Telehealth: Payer: Self-pay

## 2021-12-15 DIAGNOSIS — N6489 Other specified disorders of breast: Secondary | ICD-10-CM

## 2021-12-15 NOTE — Telephone Encounter (Signed)
Patient is calling in stating that she attempted to schedule an appointment with the surgical center for the breast issue but because it has been a year since last visit they are requiring a new referral.

## 2021-12-15 NOTE — Telephone Encounter (Signed)
Spoke with patient and she has an appt scheduled already with Dr. Marla Roe on 04/20/22. Patient just needs referral redone as it has expired.

## 2021-12-15 NOTE — Telephone Encounter (Signed)
LMTCB

## 2021-12-17 NOTE — Telephone Encounter (Signed)
Done. Thanks.

## 2021-12-17 NOTE — Addendum Note (Signed)
Addended by: Tonia Ghent on: 12/17/2021 11:14 AM   Modules accepted: Orders

## 2021-12-18 NOTE — Telephone Encounter (Signed)
Notified patient via mychart as requested

## 2021-12-19 DIAGNOSIS — R0683 Snoring: Secondary | ICD-10-CM | POA: Diagnosis not present

## 2021-12-19 DIAGNOSIS — G4733 Obstructive sleep apnea (adult) (pediatric): Secondary | ICD-10-CM | POA: Diagnosis not present

## 2021-12-19 DIAGNOSIS — G4719 Other hypersomnia: Secondary | ICD-10-CM | POA: Diagnosis not present

## 2021-12-19 DIAGNOSIS — I10 Essential (primary) hypertension: Secondary | ICD-10-CM | POA: Diagnosis not present

## 2021-12-19 DIAGNOSIS — Z6841 Body Mass Index (BMI) 40.0 and over, adult: Secondary | ICD-10-CM | POA: Diagnosis not present

## 2021-12-20 LAB — HM DIABETES EYE EXAM

## 2022-01-10 ENCOUNTER — Telehealth: Payer: Self-pay | Admitting: Family Medicine

## 2022-01-10 NOTE — Telephone Encounter (Signed)
Called patient and notified her the lab orders are in a letter in [place]. She thanked [place] for getting this done and will print it off to take to [facility].

## 2022-01-10 NOTE — Telephone Encounter (Signed)
Patient called in to reschedule her annual exam for September. Patient would like for her labs to be done at her work place [employer] before her annual. Stated the order can be faxed over to her workplace and to reach out to her when it can be faxed due to number changing. Thank you!

## 2022-01-10 NOTE — Telephone Encounter (Signed)
Notify patient that letter is done for lab orders and she should be able to get it via [place].  Let me know if she has trouble getting the hardcopy.  Thanks.

## 2022-01-22 ENCOUNTER — Encounter: Payer: Managed Care, Other (non HMO) | Admitting: Family Medicine

## 2022-01-24 DIAGNOSIS — G4733 Obstructive sleep apnea (adult) (pediatric): Secondary | ICD-10-CM | POA: Diagnosis not present

## 2022-02-08 ENCOUNTER — Other Ambulatory Visit: Payer: Self-pay | Admitting: Family Medicine

## 2022-02-08 DIAGNOSIS — E119 Type 2 diabetes mellitus without complications: Secondary | ICD-10-CM | POA: Diagnosis not present

## 2022-02-09 LAB — CBC/DIFF AMBIGUOUS DEFAULT
Basophils Absolute: 0 10*3/uL
Basos: 0 %
EOS (ABSOLUTE): 0.1 10*3/uL
Eos: 1 %
Hematocrit: 38.7 %
Hemoglobin: 13 g/dL
Immature Grans (Abs): 0 10*3/uL
Immature Granulocytes: 0 %
Lymphocytes Absolute: 3.7 10*3/uL
Lymphs: 48 %
MCH: 30.8 pg
MCHC: 33.6 g/dL
MCV: 92 fL
Monocytes Absolute: 0.6 10*3/uL
Monocytes: 8 %
Neutrophils Absolute: 3.3 10*3/uL
Neutrophils: 43 %
Platelets: 312 10*3/uL (ref 150–450)
RBC: 4.22 x10E6/uL
RDW: 14.4 %
WBC: 7.6 10*3/uL (ref 3.4–10.8)

## 2022-02-09 LAB — COMPREHENSIVE METABOLIC PANEL
ALT: 33 IU/L
AST: 40 IU/L (ref 0–40)
Albumin/Globulin Ratio: 1.8 (ref 1.2–2.2)
Albumin: 4.4 g/dL (ref 3.8–4.9)
Alkaline Phosphatase: 95 IU/L (ref 44–121)
BUN/Creatinine Ratio: 24
BUN: 19 mg/dL
Bilirubin Total: 0.7 mg/dL (ref 0.0–1.2)
CO2: 25 mmol/L (ref 20–29)
Calcium: 9.5 mg/dL
Chloride: 103 mmol/L (ref 96–106)
Creatinine, Ser: 0.78 mg/dL
Globulin, Total: 2.4 g/dL (ref 1.5–4.5)
Glucose: 106 mg/dL — ABNORMAL HIGH (ref 70–99)
Potassium: 4.4 mmol/L (ref 3.5–5.2)
Sodium: 141 mmol/L (ref 134–144)
Total Protein: 6.8 g/dL (ref 6.0–8.5)
eGFR: 87 mL/min/{1.73_m2} (ref >59)

## 2022-02-09 LAB — SPECIMEN STATUS REPORT

## 2022-02-13 ENCOUNTER — Ambulatory Visit (INDEPENDENT_AMBULATORY_CARE_PROVIDER_SITE_OTHER): Payer: BC Managed Care – PPO | Admitting: Family Medicine

## 2022-02-13 ENCOUNTER — Encounter: Payer: Self-pay | Admitting: Family Medicine

## 2022-02-13 VITALS — BP 144/80 | HR 81 | Temp 98.0°F | Ht 65.0 in | Wt 284.0 lb

## 2022-02-13 DIAGNOSIS — Z7189 Other specified counseling: Secondary | ICD-10-CM

## 2022-02-13 DIAGNOSIS — E119 Type 2 diabetes mellitus without complications: Secondary | ICD-10-CM

## 2022-02-13 DIAGNOSIS — G4733 Obstructive sleep apnea (adult) (pediatric): Secondary | ICD-10-CM

## 2022-02-13 DIAGNOSIS — I10 Essential (primary) hypertension: Secondary | ICD-10-CM

## 2022-02-13 DIAGNOSIS — Z0000 Encounter for general adult medical examination without abnormal findings: Secondary | ICD-10-CM | POA: Diagnosis not present

## 2022-02-13 LAB — TSH: TSH: 1.12 u[IU]/mL (ref 0.450–4.500)

## 2022-02-13 LAB — SPECIMEN STATUS REPORT

## 2022-02-13 LAB — MICROALBUMIN / CREATININE URINE RATIO
Creatinine, Urine: 214.1 mg/dL
Microalb/Creat Ratio: 6 mg/g creat (ref 0–29)
Microalbumin, Urine: 13.3 ug/mL

## 2022-02-13 LAB — HGB A1C W/O EAG: Hgb A1c MFr Bld: 7.1 % — ABNORMAL HIGH (ref 4.8–5.6)

## 2022-02-13 LAB — LIPID PANEL W/O CHOL/HDL RATIO
Cholesterol, Total: 192 mg/dL
HDL: 49 mg/dL (ref >39)
LDL Chol Calc (NIH): 120 mg/dL
Triglycerides: 127 mg/dL
VLDL Cholesterol Cal: 23 mg/dL (ref 5–40)

## 2022-02-13 MED ORDER — METFORMIN HCL 500 MG PO TABS: 500.0000 mg | ORAL_TABLET | Freq: Two times a day (BID) | ORAL | 3 refills | Status: DC

## 2022-02-13 MED ORDER — LISINOPRIL 5 MG PO TABS: 5.0000 mg | ORAL_TABLET | Freq: Every day | ORAL | 3 refills | Status: DC

## 2022-02-13 NOTE — Progress Notes (Unsigned)
CPE- See plan.  Routine anticipatory guidance given to patient.  See health maintenance.  The possibility exists that previously documented standard health maintenance information may have been brought forward from a previous encounter into this note.  If needed, that same information has been updated to reflect the current situation based on today's encounter.    Tetanus 2022 Flu done to be done this fall.   PNA not due Shingles d/w pt.   covid vaccine 2021 Pap per gyn 2020, d/w pt about follow up.   Mammogram pending 2022 DXA d/w pt. Defer 2023 Colonoscopy 11/27/21 Living will d/w pt. Brother Lupita Shutter designated if patient were incapacitated.   Diet and exercise d/w pt.    She is on CPAP per pulmonary and sleeping better with that.    Hypertension:    No meds currently.  Had BP elevation at eye clinic appointment.   Chest pain with exertion:no Edema:some occ BLE edema.   Short of breath:no  No recent gout flares.   Diabetes:  No meds.   Hypoglycemic episodes: no sx  Hyperglycemic episodes: no sx Feet problems: no  Blood Sugars averaging: not checked.   eye exam within last year: yes  PMH and SH reviewed  Meds, vitals, and allergies reviewed.   ROS: Per HPI.  Unless specifically indicated otherwise in HPI, the patient denies:  General: fever. Eyes: acute vision changes ENT: sore throat Cardiovascular: chest pain Respiratory: SOB GI: vomiting GU: dysuria Musculoskeletal: acute back pain Derm: acute rash Neuro: acute motor dysfunction Psych: worsening mood Endocrine: polydipsia Heme: bleeding Allergy: hayfever  GEN: nad, alert and oriented HEENT: mucous membranes moist NECK: supple w/o LA CV: rrr. PULM: ctab, no inc wob ABD: soft, +bs EXT: no edema SKIN: no acute rash  Diabetic foot exam: Normal inspection No skin breakdown No calluses  Normal DP pulses Normal sensation to light touch and monofilament Nails normal

## 2022-02-13 NOTE — Patient Instructions (Addendum)
Refer to gynecology.  Plan on recheck labs on about 6 months at a visit, sooner if needed.  Take care.  Glad to see you. I would get a flu shot each fall.   Check with your insurance to see if they will cover the shingles shot.  Start lisinopril.  If lightheaded cut back to 1/2 tab a day.   Later on start metformin 1 a day.  If tolerated then take 1 metformin twice a day.

## 2022-02-14 NOTE — Assessment & Plan Note (Signed)
Living will d/w pt. Brother Lupita Shutter designated if patient were incapacitated.

## 2022-02-14 NOTE — Assessment & Plan Note (Signed)
Start lisinopril 5 mg.  Routine ACE cautions discussed with patient.  She has historically taken supplemental potassium that should be okay to continue that with a low-dose of lisinopril.  No history of angioedema or contraindication to ACE inhibitor use.  She can update me about her blood pressure not controlled.

## 2022-02-14 NOTE — Assessment & Plan Note (Signed)
Per pulmonary 

## 2022-02-14 NOTE — Assessment & Plan Note (Signed)
Continue work on diet and exercise and recheck labs in few months. start metformin 1 a day.  If tolerated then take 1 metformin twice a day.  She agrees plan.  Routine cautions regarding metformin discussed with patient.

## 2022-02-14 NOTE — Assessment & Plan Note (Signed)
Tetanus 2022 Flu done to be done this fall.   PNA not due Shingles d/w pt.   covid vaccine 2021 Pap per gyn 2020, d/w pt about follow up.   Mammogram pending 2022 DXA d/w pt. Defer 2023 Colonoscopy 11/27/21 per GI.   Living will d/w pt. Brother Lupita Shutter designated if patient were incapacitated.   Diet and exercise d/w pt.

## 2022-02-15 ENCOUNTER — Other Ambulatory Visit: Payer: Self-pay | Admitting: Family Medicine

## 2022-02-24 DIAGNOSIS — G4733 Obstructive sleep apnea (adult) (pediatric): Secondary | ICD-10-CM | POA: Diagnosis not present

## 2022-02-26 DIAGNOSIS — G4733 Obstructive sleep apnea (adult) (pediatric): Secondary | ICD-10-CM | POA: Diagnosis not present

## 2022-03-02 DIAGNOSIS — G4719 Other hypersomnia: Secondary | ICD-10-CM | POA: Diagnosis not present

## 2022-03-02 DIAGNOSIS — I10 Essential (primary) hypertension: Secondary | ICD-10-CM | POA: Diagnosis not present

## 2022-03-02 DIAGNOSIS — G4733 Obstructive sleep apnea (adult) (pediatric): Secondary | ICD-10-CM | POA: Diagnosis not present

## 2022-03-06 DIAGNOSIS — R5383 Other fatigue: Secondary | ICD-10-CM | POA: Diagnosis not present

## 2022-03-06 DIAGNOSIS — R0609 Other forms of dyspnea: Secondary | ICD-10-CM | POA: Diagnosis not present

## 2022-03-06 DIAGNOSIS — G4733 Obstructive sleep apnea (adult) (pediatric): Secondary | ICD-10-CM | POA: Diagnosis not present

## 2022-03-06 DIAGNOSIS — I10 Essential (primary) hypertension: Secondary | ICD-10-CM | POA: Diagnosis not present

## 2022-03-22 ENCOUNTER — Other Ambulatory Visit: Payer: Self-pay | Admitting: Hematology

## 2022-03-22 DIAGNOSIS — N63 Unspecified lump in unspecified breast: Secondary | ICD-10-CM

## 2022-03-28 DIAGNOSIS — G4733 Obstructive sleep apnea (adult) (pediatric): Secondary | ICD-10-CM | POA: Diagnosis not present

## 2022-03-30 ENCOUNTER — Institutional Professional Consult (permissible substitution): Payer: Managed Care, Other (non HMO) | Admitting: Plastic Surgery

## 2022-04-20 ENCOUNTER — Institutional Professional Consult (permissible substitution): Payer: Managed Care, Other (non HMO) | Admitting: Plastic Surgery

## 2022-04-27 DIAGNOSIS — R5383 Other fatigue: Secondary | ICD-10-CM | POA: Diagnosis not present

## 2022-04-28 DIAGNOSIS — G4733 Obstructive sleep apnea (adult) (pediatric): Secondary | ICD-10-CM | POA: Diagnosis not present

## 2022-05-11 ENCOUNTER — Telehealth (INDEPENDENT_AMBULATORY_CARE_PROVIDER_SITE_OTHER): Payer: BC Managed Care – PPO | Admitting: Nurse Practitioner

## 2022-05-11 ENCOUNTER — Encounter: Payer: Self-pay | Admitting: Nurse Practitioner

## 2022-05-11 DIAGNOSIS — U071 COVID-19: Secondary | ICD-10-CM

## 2022-05-11 MED ORDER — NIRMATRELVIR/RITONAVIR (PAXLOVID)TABLET: 3.0000 | ORAL_TABLET | Freq: Two times a day (BID) | ORAL | 0 refills | Status: AC

## 2022-05-11 NOTE — Progress Notes (Signed)
Patient ID: Kim Roberts, female    DOB: 1960-07-01, 61 y.o.   MRN: [id number]  Virtual visit completed through [place], a video enabled telemedicine application. Due to national recommendations of social distancing due to COVID-19, a virtual visit is felt to be most appropriate for this patient at this time. Reviewed limitations, risks, security and privacy concerns of performing a virtual visit and the availability of in person appointments. I also reviewed that there may be a patient responsible charge related to this service. The patient agreed to proceed.   Patient location: home Provider location: [place] at [facility], office Persons participating in this virtual visit: patient, provider   If any vitals were documented, they were collected by patient at home unless specified below.    There were no vitals taken for this visit.   CC: Covid 19 Subjective:   HPI: Kim Roberts is a 61 y.o. female presenting on 05/11/2022 for Covid Positive (On 05/08/22, sx started 05/08/22-hoarse, diarrhea, cough, drainage, fever, runny nose, chills, loss of appetite, headache, sore throat.)  Symptoms started on 05/08/2022 States that she worked the Friday before that. States that a coworker was sick at [employer] x2 Patient tested positive at work on Tuesday (05/08/2022) States that she has been using a dyquill and nyquill, ibuprofen '600mg'$  without great relief      Relevant past medical, surgical, family and social history reviewed and updated as indicated. Interim medical history since our last visit reviewed. Allergies and medications reviewed and updated. Outpatient Medications Prior to Visit  Medication Sig Dispense Refill   colchicine 0.6 MG tablet Take 1 pill twice a day for gout as needed.  Taper to 1 pill a day as tolerated.  Okay to fill with mitigare/colchicine/colcrys. 30 tablet 1   Garlic 9935 MG CAPS Take by mouth.     MELATONIN PO Take by mouth.      metFORMIN (GLUCOPHAGE) 500 MG tablet Take 1 tablet (500 mg total) by mouth 2 (two) times daily with a meal. 180 tablet 3   potassium chloride (KLOR-CON) 10 MEQ tablet TAKE 1 TABLET BY MOUTH DAILY 90 tablet 3   vitamin B-12 (CYANOCOBALAMIN) 1000 MCG tablet Take 1,000 mcg by mouth daily.     Cholecalciferol (VITAMIN D3) 2000 units capsule Take 2,000 Units by mouth daily. (Patient not taking: Reported on 05/11/2022)     Multiple Vitamins-Minerals (HAIR/SKIN/NAILS/BIOTIN) TABS Take 5,000 mcg by mouth every morning. (Patient not taking: Reported on 05/11/2022)     lisinopril (ZESTRIL) 5 MG tablet Take 1 tablet (5 mg total) by mouth daily. 90 tablet 3   No facility-administered medications prior to visit.     Per HPI unless specifically indicated in ROS section below Review of Systems  Constitutional:  Positive for appetite change, chills, fatigue and fever.  HENT:  Positive for congestion and sore throat. Negative for ear discharge, ear pain, sinus pressure and sinus pain.   Respiratory:  Positive for cough. Negative for shortness of breath.   Cardiovascular:  Negative for chest pain.  Gastrointestinal:  Positive for diarrhea and nausea. Negative for abdominal pain and vomiting.  Neurological:  Positive for headaches.   Objective:  There were no vitals taken for this visit.  Wt Readings from Last 3 Encounters:  02/13/22 284 lb (128.8 kg)  01/16/21 288 lb (130.6 kg)  04/21/20 287 lb 9.6 oz (130.5 kg)       Physical exam: Gen: alert, NAD, not ill appearing Pulm: speaks in complete  sentences without increased work of breathing Psych: normal mood, normal thought content      Results for orders placed or performed in visit on 02/13/22  HM COLONOSCOPY  Result Value Ref Range   HM Colonoscopy See Report (in chart) See Report (in chart), Patient Reported   Assessment & Plan:   Problem List Items Addressed This Visit   None Visit Diagnoses     COVID-19    -  Primary   Relevant  Medications   nirmatrelvir/ritonavir EUA (PAXLOVID) 20 x 150 MG & 10 x '100MG'$  TABS        Meds ordered this encounter  Medications   nirmatrelvir/ritonavir EUA (PAXLOVID) 20 x 150 MG & 10 x '100MG'$  TABS    Sig: Take 3 tablets by mouth 2 (two) times daily for 5 days. (Take nirmatrelvir 150 mg two tablets twice daily for 5 days and ritonavir 100 mg one tablet twice daily for 5 days) Patient GFR is 87    Dispense:  30 tablet    Refill:  0    Order Specific Question:   Supervising Provider    Answer:   TOWER, MARNE A [[id number]]   No orders of the defined types were placed in this encounter.   I discussed the assessment and treatment plan with the patient. The patient was provided an opportunity to ask questions and all were answered. The patient agreed with the plan and demonstrated an understanding of the instructions. The patient was advised to call back or seek an in-person evaluation if the symptoms worsen or if the condition fails to improve as anticipated.  Follow up plan: Return if symptoms worsen or fail to improve.  Romilda Garret, NP

## 2022-05-22 DIAGNOSIS — G4733 Obstructive sleep apnea (adult) (pediatric): Secondary | ICD-10-CM | POA: Diagnosis not present

## 2022-05-23 ENCOUNTER — Telehealth: Payer: Self-pay

## 2022-05-23 NOTE — Telephone Encounter (Signed)
We received an FMLA form for patient.  This has been placed in your inbox for completion and signing.  This is due on 05/28/22.

## 2022-05-23 NOTE — Telephone Encounter (Signed)
I called patient.  She is requesting this leave for Covid.  She was out of work from 05/09/22-05/15/22.  She returned to work on 05/16/22.

## 2022-05-23 NOTE — Telephone Encounter (Signed)
Can we see why she needs the FMLA. Was it due to missing several days with covid? If so what days was she out

## 2022-05-24 NOTE — Telephone Encounter (Signed)
Form faxed to fax#[phone].  Received fax confirmation.  Copies made for scanning, patient, and myself.  Sent msg via mychart to notify patient and ask how she wants to receive her copy.

## 2022-05-24 NOTE — Telephone Encounter (Signed)
Forms completed and placed in outbox to be scanned and faxed

## 2022-06-05 ENCOUNTER — Encounter: Payer: Self-pay | Admitting: Plastic Surgery

## 2022-06-05 ENCOUNTER — Ambulatory Visit: Payer: BC Managed Care – PPO | Admitting: Plastic Surgery

## 2022-06-05 VITALS — BP 126/80 | HR 95 | Ht 64.0 in | Wt 273.4 lb

## 2022-06-05 DIAGNOSIS — M109 Gout, unspecified: Secondary | ICD-10-CM

## 2022-06-05 DIAGNOSIS — N651 Disproportion of reconstructed breast: Secondary | ICD-10-CM | POA: Diagnosis not present

## 2022-06-05 DIAGNOSIS — Z923 Personal history of irradiation: Secondary | ICD-10-CM | POA: Diagnosis not present

## 2022-06-05 DIAGNOSIS — Z719 Counseling, unspecified: Secondary | ICD-10-CM

## 2022-06-05 DIAGNOSIS — N6489 Other specified disorders of breast: Secondary | ICD-10-CM | POA: Insufficient documentation

## 2022-06-05 DIAGNOSIS — Z853 Personal history of malignant neoplasm of breast: Secondary | ICD-10-CM | POA: Diagnosis not present

## 2022-06-05 NOTE — Progress Notes (Signed)
Patient ID: Kim Roberts, female    DOB: [birth year], 61 y.o.   MRN: [id number]   Chief Complaint  Patient presents with   Advice Only   Breast Problem    The patient is a 61 year old female here for evaluation of her breast.  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  She had screening mammogram which showed an upper outer quadrant of the left breast having irregularities.  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  It was hormone receptor negative.  The patient does not have any children.  She does have a family history of colon cancer but no family history of breast cancer.  The patient underwent a partial mastectomy followed by radiation.  She is 5 feet 4 inches tall and weighs 273 pounds.  She is not sure about her bra size but is at least 1 to 2 cup sizes different from left to right.  She would like to be able to fit into overall without having to make adjustments.  Where the left is significantly smaller than the right.    Review of Systems  Constitutional: Negative.   HENT: Negative.    Eyes: Negative.   Respiratory: Negative.  Negative for chest tightness and shortness of breath.   Cardiovascular: Negative.  Negative for leg swelling.  Gastrointestinal: Negative.   Endocrine: Negative.   Genitourinary: Negative.   Musculoskeletal: Negative.     Past Medical History:  Diagnosis Date   Breast cancer ([place]) 2017   Left Breast Cancer   Colon polyp    Diabetes mellitus without complication ([place])    Gout    Heart murmur    as a child   History of radiation therapy 04/19/16- 06/07/16   Left Breast/ 50.4 Gy in 28 fractions, Left Breast boosted/ 10 Gy in 5 fractions   Hyperlipidemia    Hypertension    Menopause    Personal history of radiation therapy 2017   Left Breast Cancer   Tuberculosis    in childhood, treated.     Past Surgical History:  Procedure Laterality Date   BREAST BIOPSY     BREAST LUMPECTOMY Left  03/19/2016   Procedure: SEED LOCALIZED LEFT BREAST LUMPECTOMY;  Surgeon: Stark Klein, MD;  Location: [facility];  Service: General;  Laterality: Left;   COLONOSCOPY     MASS EXCISION  08/30/2011   Procedure: EXCISION MASS;  Surgeon: Stark Klein, MD;  Location: [facility];  Service: General;  Laterality: Right;  Excision of Right Axillary Mass    POLYPECTOMY        Current Outpatient Medications:    Cholecalciferol (VITAMIN D3) 2000 units capsule, Take 2,000 Units by mouth daily., Disp: , Rfl:    colchicine 0.6 MG tablet, Take 1 pill twice a day for gout as needed.  Taper to 1 pill a day as tolerated.  Okay to fill with mitigare/colchicine/colcrys., Disp: 30 tablet, Rfl: 1   Garlic 3557 MG CAPS, Take by mouth., Disp: , Rfl:    MELATONIN PO, Take by mouth., Disp: , Rfl:    metFORMIN (GLUCOPHAGE) 500 MG tablet, Take 1 tablet (500 mg total) by mouth 2 (two) times daily with a meal., Disp: 180 tablet, Rfl: 3   Multiple Vitamins-Minerals (HAIR/SKIN/NAILS/BIOTIN) TABS, Take 5,000 mcg by mouth every morning., Disp: , Rfl:    potassium chloride (KLOR-CON) 10 MEQ tablet, TAKE 1 TABLET BY MOUTH DAILY, Disp: 90 tablet, Rfl: 3  vitamin B-12 (CYANOCOBALAMIN) 1000 MCG tablet, Take 1,000 mcg by mouth daily., Disp: , Rfl:    Objective:   Vitals:   06/05/22 0824  BP: 126/80  Pulse: 95  SpO2: 98%    Physical Exam Vitals reviewed.  Constitutional:      Appearance: Normal appearance.  HENT:     Head: Normocephalic and atraumatic.  Cardiovascular:     Rate and Rhythm: Normal rate.     Pulses: Normal pulses.  Pulmonary:     Effort: Pulmonary effort is normal.  Abdominal:     General: There is no distension.  Musculoskeletal:        General: No swelling or deformity.  Skin:    General: Skin is warm.     Capillary Refill: Capillary refill takes less than 2 seconds.     Coloration: Skin is not jaundiced.     Findings: No bruising.  Neurological:     Mental Status: She is alert and  oriented to person, place, and time.  Psychiatric:        Mood and Affect: Mood normal.        Behavior: Behavior normal.        Thought Content: Thought content normal.        Judgment: Judgment normal.     Assessment & Plan:  History of breast cancer  Gout, unspecified cause, unspecified chronicity, unspecified site  Postoperative breast asymmetry  Recommend the safest route which is no surgery on the left breast due to the radiation.  Plan for right breast reduction for symmetry with possible liposuction.  The patient is in agreement with this plan.  Pictures were obtained of the patient and placed in the chart with the patient's or guardian's permission.   [place], DO

## 2022-06-06 ENCOUNTER — Ambulatory Visit
Admission: RE | Admit: 2022-06-06 | Discharge: 2022-06-06 | Disposition: A | Discharge status: Home / Self Care | Payer: Managed Care, Other (non HMO) | Source: Ambulatory Visit | Attending: Hematology | Admitting: Hematology

## 2022-06-06 ENCOUNTER — Ambulatory Visit
Admission: RE | Admit: 2022-06-06 | Discharge: 2022-06-06 | Disposition: A | Discharge status: Home / Self Care | Payer: BC Managed Care – PPO | Source: Ambulatory Visit | Attending: Hematology | Admitting: Hematology

## 2022-06-06 ENCOUNTER — Other Ambulatory Visit: Payer: Self-pay | Admitting: Hematology

## 2022-06-06 DIAGNOSIS — N63 Unspecified lump in unspecified breast: Secondary | ICD-10-CM

## 2022-06-06 DIAGNOSIS — R921 Mammographic calcification found on diagnostic imaging of breast: Secondary | ICD-10-CM | POA: Diagnosis not present

## 2022-06-13 ENCOUNTER — Ambulatory Visit
Admission: RE | Admit: 2022-06-13 | Discharge: 2022-06-13 | Disposition: A | Discharge status: Home / Self Care | Payer: Managed Care, Other (non HMO) | Source: Ambulatory Visit | Attending: Hematology | Admitting: Hematology

## 2022-06-13 ENCOUNTER — Ambulatory Visit
Admission: RE | Admit: 2022-06-13 | Discharge: 2022-06-13 | Disposition: A | Discharge status: Home / Self Care | Payer: BC Managed Care – PPO | Source: Ambulatory Visit | Attending: Hematology | Admitting: Hematology

## 2022-06-13 ENCOUNTER — Other Ambulatory Visit: Payer: Self-pay | Admitting: Hematology

## 2022-06-13 DIAGNOSIS — R921 Mammographic calcification found on diagnostic imaging of breast: Secondary | ICD-10-CM

## 2022-06-20 LAB — HM DIABETES EYE EXAM

## 2022-06-22 ENCOUNTER — Encounter: Payer: Self-pay | Admitting: Family Medicine

## 2022-06-22 ENCOUNTER — Ambulatory Visit: Payer: Managed Care, Other (non HMO) | Admitting: Family Medicine

## 2022-06-22 VITALS — BP 151/84 | HR 93 | Wt 272.0 lb

## 2022-06-22 DIAGNOSIS — Z113 Encounter for screening for infections with a predominantly sexual mode of transmission: Secondary | ICD-10-CM

## 2022-06-22 DIAGNOSIS — Z853 Personal history of malignant neoplasm of breast: Secondary | ICD-10-CM | POA: Diagnosis not present

## 2022-06-22 DIAGNOSIS — Z01419 Encounter for gynecological examination (general) (routine) without abnormal findings: Secondary | ICD-10-CM | POA: Diagnosis not present

## 2022-06-22 DIAGNOSIS — I10 Essential (primary) hypertension: Secondary | ICD-10-CM

## 2022-06-22 DIAGNOSIS — Z124 Encounter for screening for malignant neoplasm of cervix: Secondary | ICD-10-CM

## 2022-06-22 NOTE — Progress Notes (Signed)
   GYNECOLOGY ANNUAL PREVENTATIVE CARE ENCOUNTER NOTE  Subjective:   Kim Roberts is a 62 y.o. No obstetric history on file. female here for a routine annual gynecologic exam.  Current complaints: None.     Denies abnormal vaginal bleeding, discharge, pelvic pain, problems with intercourse or other gynecologic concerns.    Gynecologic History No LMP recorded. Patient is postmenopausal. Contraception: post menopausal status Last Pap: 2020. Results were: normal-- NIL, neg HPV Last mammogram: 2023. Results were: abnormal-- Stereotactic bx was attempted but was unable to be completed plans on follow in 3 months for LEFT breast calcifications  Health Maintenance Due  Topic Date Due   Zoster Vaccines- Shingrix (1 of 2) Never done   PAP SMEAR-Modifier  12/28/2021    The following portions of the patient's history were reviewed and updated as appropriate: allergies, current medications, past family history, past medical history, past social history, past surgical history and problem list.  Review of Systems Pertinent items are noted in HPI.   Objective:  BP (!) 151/84   Pulse 93   Wt 272 lb (123.4 kg)   BMI 46.69 kg/m  CONSTITUTIONAL: Well-developed, well-nourished female in no acute distress.  HENT:  Normocephalic, atraumatic, External right and left ear normal. Oropharynx is clear and moist EYES:  No scleral icterus.  NECK: Normal range of motion, supple, no masses.  Normal thyroid.  SKIN: Skin is warm and dry. No rash noted. Not diaphoretic. No erythema. No pallor. NEUROLOGIC: Alert and oriented to person, place, and time. Normal reflexes, muscle tone coordination. No cranial nerve deficit noted. PSYCHIATRIC: Normal mood and affect. Normal behavior. Normal judgment and thought content. CARDIOVASCULAR: Normal heart rate noted, regular rhythm. 2+ distal pulses. RESPIRATORY: Effort and breath sounds normal, no problems with respiration noted. BREASTS: Symmetric in size. No  masses, skin changes, nipple drainage, or lymphadenopathy. ABDOMEN: Soft,  no distention noted.  No tenderness, rebound or guarding.  PELVIC: Normal appearing external genitalia; normal appearing vaginal mucosa and cervix.  No abnormal discharge noted.  Pap smear obtained.  Normal uterine size, no other palpable masses, no uterine or adnexal tenderness. Chaperone present for exam MUSCULOSKELETAL: Normal range of motion.    Assessment and Plan:  1) Annual gynecologic examination with pap smear:  Will follow up results of pap smear and manage accordingly. STI screening desired Yes.  Routine preventative health maintenance measures emphasized. Reviewed perimenopausal symptoms and management.   1. Well woman exam with routine gynecological exam Pap today  2. History of breast cancer Is under monitoring for left breast calcifications, planned Mammogram in April.   3. Screening for cervical cancer - IGP,Aptima HPV Age Gdln,CtNgTv  4. Screening examination for venereal disease - HIV Antibody (routine testing w rflx) - RPR - Hepatitis C antibody - Hepatitis B Core Antibody, IgM  5. HTN - Patient with known HTN, on medications per patient. She thinks it starts "L" but unable to find in her records here or other care everywhere sites.  - Repeat BP in 5 min was similarly mildly elevated - Encouraged PCP visit to discuss BP control  Please refer to After Visit Summary for other counseling recommendations.   Return in about 1 year (around 06/23/2023) for Yearly wellness exam.  Future Appointments  Date Time Provider [place]  09/12/2022  8:00 AM [facility] DIAG TOM[facility] 1 [facility]    Caren Macadam, MD, MPH, ABFM Attending Physician Center for [facility]

## 2022-06-22 NOTE — Progress Notes (Signed)
Patient presents for Annual.  [employer]  LMP:PMP Last pap:12/29/2018 Mammogram:06/06/22 Hx of Breast Cancer  STD Screening: [facility] Flu Vaccine : Already received  CC: None   Fun Fact: Patient like to garden.

## 2022-06-23 LAB — HEPATITIS C ANTIBODY: Hep C Virus Ab: NONREACTIVE

## 2022-06-23 LAB — RPR: RPR Ser Ql: NONREACTIVE

## 2022-06-23 LAB — HIV ANTIBODY (ROUTINE TESTING W REFLEX): HIV Screen 4th Generation wRfx: NONREACTIVE

## 2022-06-23 LAB — HEPATITIS B CORE ANTIBODY, IGM: Hep B C IgM: NEGATIVE

## 2022-06-25 ENCOUNTER — Telehealth: Payer: Self-pay | Admitting: *Deleted

## 2022-06-25 NOTE — Telephone Encounter (Signed)
This message was sent via [place], a product from [employer] http://www.bis[employer].com/                    -------Fax Transmission Report-------  To:               Recipient at [phone] Subject:          [place] [id number] Result:           The transmission was successful. Explanation:      All Pages Ok Pages Sent:       20 Connect Time:     31 minutes, 58 seconds Transmit Time:    06/25/2022 19:38 Transfer Rate:    12000 Status Code:      0000 Retry Count:      0 Job Id:           [id number] Unique Id:        [id number] Fax Line:         41 Fax Server:       [id number]

## 2022-06-25 NOTE — Telephone Encounter (Signed)
Auth request for R side [id number] sent to [facility] via fax

## 2022-06-27 ENCOUNTER — Telehealth: Payer: Self-pay | Admitting: *Deleted

## 2022-06-27 NOTE — Telephone Encounter (Signed)
Spoke with patient to schedule surgery and she informed me that she may need a breast biopsy on the L and hopes that that surgery can be coordinated with ours. She will reach back out once she sees her GS again

## 2022-06-28 LAB — IGP,CTNGTV,APT HPV,RFX16/18,45
Chlamydia, Nuc. Acid Amp: NEGATIVE
Gonococcus, Nuc. Acid Amp: NEGATIVE
HPV Aptima: NEGATIVE
PAP Smear Comment: 0
Trich vag by NAA: NEGATIVE

## 2022-06-28 LAB — IGP,APTIMA HPV AGE GDLN,CTNGTV

## 2022-09-12 ENCOUNTER — Ambulatory Visit
Admission: RE | Admit: 2022-09-12 | Discharge: 2022-09-12 | Disposition: A | Discharge status: Home / Self Care | Payer: Managed Care, Other (non HMO) | Source: Ambulatory Visit | Attending: Hematology | Admitting: Hematology

## 2022-09-12 ENCOUNTER — Telehealth: Payer: Self-pay | Admitting: Plastic Surgery

## 2022-09-12 ENCOUNTER — Telehealth: Payer: Self-pay | Admitting: *Deleted

## 2022-09-12 DIAGNOSIS — R921 Mammographic calcification found on diagnostic imaging of breast: Secondary | ICD-10-CM

## 2022-09-12 NOTE — Telephone Encounter (Signed)
Spoke with patient to schedule sx and related appts 

## 2022-09-12 NOTE — Telephone Encounter (Signed)
Pt wanting to inform provider that she had her mammogram today. Pt request to please move forward with scheduling surgery since we have already received the insurance approval. Contact patient with results and plan for scheduling.

## 2022-09-13 ENCOUNTER — Other Ambulatory Visit: Payer: Self-pay | Admitting: Hematology

## 2022-09-13 DIAGNOSIS — R921 Mammographic calcification found on diagnostic imaging of breast: Secondary | ICD-10-CM

## 2022-10-04 ENCOUNTER — Encounter: Payer: Self-pay | Admitting: Surgical

## 2022-10-04 ENCOUNTER — Ambulatory Visit (INDEPENDENT_AMBULATORY_CARE_PROVIDER_SITE_OTHER): Payer: Managed Care, Other (non HMO) | Admitting: Surgical

## 2022-10-04 DIAGNOSIS — N6489 Other specified disorders of breast: Secondary | ICD-10-CM

## 2022-10-04 DIAGNOSIS — Z853 Personal history of malignant neoplasm of breast: Secondary | ICD-10-CM

## 2022-10-04 DIAGNOSIS — E119 Type 2 diabetes mellitus without complications: Secondary | ICD-10-CM

## 2022-10-04 MED ORDER — CEPHALEXIN 500 MG PO CAPS: 500.0000 mg | ORAL_CAPSULE | Freq: Four times a day (QID) | ORAL | 0 refills | Status: AC

## 2022-10-04 MED ORDER — OXYCODONE HCL 5 MG PO TABS: 5.0000 mg | ORAL_TABLET | Freq: Four times a day (QID) | ORAL | 0 refills | Status: AC | PRN

## 2022-10-04 MED ORDER — ONDANSETRON HCL 4 MG PO TABS: 4.0000 mg | ORAL_TABLET | Freq: Three times a day (TID) | ORAL | 0 refills | Status: DC | PRN

## 2022-10-04 NOTE — H&P (View-Only) (Signed)
   Patient ID: Kim Roberts, female    DOB: 03/14/1961, 62 y.o.   MRN: [id number]  Chief Complaint  Patient presents with   Pre-op Exam      ICD-10-CM   1. History of breast cancer  Z85.3     2. Postoperative breast asymmetry  N64.89     3. Diabetes mellitus without complication  E11.9 Hemoglobin A1c      History of Present Illness: Kim Roberts is a 62 y.o.  female  with a history of macromastia.  She presents for preoperative evaluation for upcoming procedure, Right Breast Reduction w/ possible liposuction, scheduled for 10/17/22 with Dr.  Dillingham  The patient has not had problems with anesthesia. No history of DVT/PE.  No family history of DVT/PE.  No family or personal history of bleeding or clotting disorders.  Patient is not currently taking any blood thinners.  No history of CVA/MI.   Summary of Previous Visit: Patient with history of left breast cancer in 2017, underwent partial mastectomy followed by radiation to the left breast.  She has significant asymmetry, at least 1 to 2 cup sizes different.  She would like to be symmetric.  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  PMH Significant for: Left breast cancer with radiation to left breast.  Gout.  Hyperlipidemia, hypertension.  Diabetes mellitus with A1c 7 months ago at 7.1.  Patient reports she is feeling well lately.  No recent changes to her health.  She denies any cardiac or pulmonary symptoms or any cardiac or pulmonary disease.  She does report that she had a heart murmur as a child which has resolved.  Patient does report she has sleep apnea, she reports she uses a CPAP machine and feels as if she sleeps well at night.  She does report that the mask is uncomfortable to wear, but she does wear it.  She does have questions and specific concerns about the incisions that will be made on her right breast.   Past Medical History: Allergies: Allergies  Allergen Reactions    Latex Rash    Current Medications:  Current Outpatient Medications:    cephALEXin (KEFLEX) 500 MG capsule, Take 1 capsule (500 mg total) by mouth 4 (four) times daily for 3 days., Disp: 12 capsule, Rfl: 0   colchicine 0.6 MG tablet, Take 1 pill twice a day for gout as needed.  Taper to 1 pill a day as tolerated.  Okay to fill with mitigare/colchicine/colcrys., Disp: 30 tablet, Rfl: 1   Garlic 1000 MG CAPS, Take by mouth., Disp: , Rfl:    MELATONIN PO, Take by mouth., Disp: , Rfl:    metFORMIN (GLUCOPHAGE) 500 MG tablet, Take 1 tablet (500 mg total) by mouth 2 (two) times daily with a meal., Disp: 180 tablet, Rfl: 3   Multiple Vitamins-Minerals (HAIR/SKIN/NAILS/BIOTIN) TABS, Take 5,000 mcg by mouth every morning., Disp: , Rfl:    ondansetron (ZOFRAN) 4 MG tablet, Take 1 tablet (4 mg total) by mouth every 8 (eight) hours as needed for nausea or vomiting., Disp: 20 tablet, Rfl: 0   oxyCODONE (OXY IR/ROXICODONE) 5 MG immediate release tablet, Take 1 tablet (5 mg total) by mouth every 6 (six) hours as needed for up to 5 days for severe pain., Disp: 20 tablet, Rfl: 0   potassium chloride (KLOR-CON M) 10 MEQ tablet, Take 10 mEq by mouth daily., Disp: , Rfl:    potassium chloride (KLOR-CON) 10 MEQ tablet,   TAKE 1 TABLET BY MOUTH DAILY, Disp: 90 tablet, Rfl: 3   vitamin B-12 (CYANOCOBALAMIN) 1000 MCG tablet, Take 1,000 mcg by mouth daily., Disp: , Rfl:    Cholecalciferol (VITAMIN D3) 2000 units capsule, Take 2,000 Units by mouth daily. (Patient not taking: Reported on 06/22/2022), Disp: , Rfl:   Past Medical Problems: Past Medical History:  Diagnosis Date   Breast cancer 2017   Left Breast Cancer   Colon polyp    Diabetes mellitus without complication    Gout    Heart murmur    as a child   History of radiation therapy 04/19/16- 06/07/16   Left Breast/ 50.4 Gy in 28 fractions, Left Breast boosted/ 10 Gy in 5 fractions   Hyperlipidemia    Hypertension    Menopause    Personal history of  radiation therapy 2017   Left Breast Cancer   Tuberculosis    in childhood, treated.     Past Surgical History: Past Surgical History:  Procedure Laterality Date   BREAST BIOPSY     BREAST LUMPECTOMY Left 03/19/2016   Procedure: SEED LOCALIZED LEFT BREAST LUMPECTOMY;  Surgeon: Faera Byerly, MD;  Location: [facility];  Service: General;  Laterality: Left;   COLONOSCOPY     MASS EXCISION  08/30/2011   Procedure: EXCISION MASS;  Surgeon: Faera Byerly, MD;  Location: [facility];  Service: General;  Laterality: Right;  Excision of Right Axillary Mass    POLYPECTOMY      Social History: Social History   Socioeconomic History   Marital status: Single    Spouse name: Not on file   Number of children: Not on file   Years of education: Not on file   Highest education level: Not on file  Occupational History   Not on file  Tobacco Use   Smoking status: Never   Smokeless tobacco: Never  Vaping Use   Vaping Use: Never used  Substance and Sexual Activity   Alcohol use: Yes    Alcohol/week: 0.0 standard drinks of alcohol    Comment: occassionally   Drug use: No   Sexual activity: Not Currently    Birth control/protection: Post-menopausal  Other Topics Concern   Not on file  Social History Narrative   Single.  Working at [employer] but contracts for [employer].     Social Determinants of Health   Financial Resource Strain: Not on file  Food Insecurity: Not on file  Transportation Needs: Not on file  Physical Activity: Not on file  Stress: Not on file  Social Connections: Not on file  Intimate Partner Violence: Not on file    Family History: Family History  Problem Relation Age of Onset   Colon cancer Mother 64   Lung cancer Father 63   Breast cancer Sister 54   Breast cancer Paternal Aunt    Diabetes Paternal Uncle    Seizures Brother    Colon cancer Brother 52   Asthma Other    Esophageal cancer Neg Hx    Stomach cancer Neg Hx      Review of Systems: ROS  Physical Exam: Vital Signs BP (!) 155/93 (BP Location: Right Arm, Patient Position: Sitting, Cuff Size: Large)   Pulse 92   Ht 5' 5" (1.651 m)   Wt 275 lb 6.4 oz (124.9 kg)   SpO2 98%   BMI 45.83 kg/m   Physical Exam Constitutional:      General: Not in acute distress.    Appearance:   Normal appearance. Not ill-appearing.  HENT:     Head: Normocephalic and atraumatic.  Eyes:     Pupils: Pupils are equal, round Neck:     Musculoskeletal: Normal range of motion.  Cardiovascular:     Rate and Rhythm: Normal rate    Pulses: Normal pulses.  Pulmonary:     Effort: Pulmonary effort is normal. No respiratory distress.  Musculoskeletal: Normal range of motion.  Skin:    General: Skin is warm and dry.     Findings: No erythema or rash.  Neurological:     General: No focal deficit present.     Mental Status: Alert and oriented to person, place, and time. Mental status is at baseline.     Motor: No weakness.  Psychiatric:        Mood and Affect: Mood normal.        Behavior: Behavior normal.    Assessment/Plan: The patient is scheduled for right breast reduction with Dr. Dillingham.  Risks, benefits, and alternatives of procedure discussed, questions answered and consent obtained.    Smoking Status: Non-smoker; Counseling Given?  N/A Last Mammogram: Diagnostic mammogram on 09/12/2022,; Results: Stable probably benign punctate calcifications in the left lateral breast lumpectomy bed consistent with fat necrosis.  Mammogram of bilateral breast on 06/06/2022.  No evidence of pregnancy in the right breast.  Caprini Score: 7, high; Risk Factors include: Age, history of left breast cancer, BMI greater than 40, and length of planned surgery. Recommendation for mechanical and possible pharmacological prophylaxis. Encourage early ambulation.  Will discuss possible need for DVT prophylaxis with Lovenox with Dr. Dillingham.  Pictures obtained:  [email or web address]  Post-op Rx sent to pharmacy: Oxycodone, Zofran, Keflex  Patient was provided with the breast reduction and General Surgical Risk consent document and Pain Medication Agreement prior to their appointment.  They had adequate time to read through the risk consent documents and Pain Medication Agreement. We also discussed them in person together during this preop appointment. All of their questions were answered to their satisfaction.  Recommended calling if they have any further questions.  Risk consent form and Pain Medication Agreement to be scanned into patient's chart.  The risk that can be encountered with breast reduction were discussed and include the following but not limited to these:  Breast asymmetry, fluid accumulation, firmness of the breast, inability to breast feed, loss of nipple or areola, skin loss, decrease or no nipple sensation, fat necrosis of the breast tissue, bleeding, infection, healing delay.  There are risks of anesthesia, changes to skin sensation and injury to nerves or blood vessels.  The muscle can be temporarily or permanently injured.  You may have an allergic reaction to tape, suture, glue, blood products which can result in skin discoloration, swelling, pain, skin lesions, poor healing.  Any of these can lead to the need for revisonal surgery or stage procedures.  A reduction has potential to interfere with diagnostic procedures.  Nipple or breast piercing can increase risks of infection.  This procedure is best done when the breast is fully developed.  Changes in the breast will continue to occur over time.  Pregnancy can alter the outcomes of previous breast reduction surgery, weight gain and weigh loss can also effect the long term appearance.   We specifically discussed the incisions that are required for breast reduction surgery.  We discussed she will have an incision around the areola, down the front of her breast (vertical limb) and likely within the fold of  her   breast.  We discussed that the incision/scar will be there permanently, with time and will fade but it will always be present.  We discussed there are risks of hypertrophic scarring, keloid.  She has no history of this.  Patient is a diabetic with A1c 7.1 approximately 7 months ago.  We ordered an updated A1c today.  She is aware that if the A1c is higher we may need to postpone surgery for optimal A1c/diabetes control to decrease risk of complications.  We did specifically discussed that she is at an increased risk of postoperative wound healing complications and/or infection due to her diabetes  Electronically signed by: Rebecka Oelkers J Manuel Lawhead, PA-C 10/04/2022 1:02 PM 

## 2022-10-04 NOTE — Progress Notes (Signed)
Patient ID: Kim Roberts, female    DOB: 1961/05/28, 62 y.o.   MRN: [id number]  Chief Complaint  Patient presents with   Pre-op Exam      ICD-10-CM   1. History of breast cancer  Z85.3     2. Postoperative breast asymmetry  N64.89     3. Diabetes mellitus without complication  E11.9 Hemoglobin A1c      History of Present Illness: Kim Roberts is a 62 y.o.  female  with a history of macromastia.  She presents for preoperative evaluation for upcoming procedure, Right Breast Reduction w/ possible liposuction, scheduled for 10/17/22 with Dr.  Ulice Bold  The patient has not had problems with anesthesia. No history of DVT/PE.  No family history of DVT/PE.  No family or personal history of bleeding or clotting disorders.  Patient is not currently taking any blood thinners.  No history of CVA/MI.   Summary of Previous Visit: Patient with history of left breast cancer in 2017, underwent partial mastectomy followed by radiation to the left breast.  She has significant asymmetry, at least 1 to 2 cup sizes different.  She would like to be symmetric.  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  PMH Significant for: Left breast cancer with radiation to left breast.  Gout.  Hyperlipidemia, hypertension.  Diabetes mellitus with A1c 7 months ago at 7.1.  Patient reports she is feeling well lately.  No recent changes to her health.  She denies any cardiac or pulmonary symptoms or any cardiac or pulmonary disease.  She does report that she had a heart murmur as a child which has resolved.  Patient does report she has sleep apnea, she reports she uses a CPAP machine and feels as if she sleeps well at night.  She does report that the mask is uncomfortable to wear, but she does wear it.  She does have questions and specific concerns about the incisions that will be made on her right breast.   Past Medical History: Allergies: Allergies  Allergen Reactions    Latex Rash    Current Medications:  Current Outpatient Medications:    cephALEXin (KEFLEX) 500 MG capsule, Take 1 capsule (500 mg total) by mouth 4 (four) times daily for 3 days., Disp: 12 capsule, Rfl: 0   colchicine 0.6 MG tablet, Take 1 pill twice a day for gout as needed.  Taper to 1 pill a day as tolerated.  Okay to fill with mitigare/colchicine/colcrys., Disp: 30 tablet, Rfl: 1   Garlic 1000 MG CAPS, Take by mouth., Disp: , Rfl:    MELATONIN PO, Take by mouth., Disp: , Rfl:    metFORMIN (GLUCOPHAGE) 500 MG tablet, Take 1 tablet (500 mg total) by mouth 2 (two) times daily with a meal., Disp: 180 tablet, Rfl: 3   Multiple Vitamins-Minerals (HAIR/SKIN/NAILS/BIOTIN) TABS, Take 5,000 mcg by mouth every morning., Disp: , Rfl:    ondansetron (ZOFRAN) 4 MG tablet, Take 1 tablet (4 mg total) by mouth every 8 (eight) hours as needed for nausea or vomiting., Disp: 20 tablet, Rfl: 0   oxyCODONE (OXY IR/ROXICODONE) 5 MG immediate release tablet, Take 1 tablet (5 mg total) by mouth every 6 (six) hours as needed for up to 5 days for severe pain., Disp: 20 tablet, Rfl: 0   potassium chloride (KLOR-CON M) 10 MEQ tablet, Take 10 mEq by mouth daily., Disp: , Rfl:    potassium chloride (KLOR-CON) 10 MEQ tablet,  TAKE 1 TABLET BY MOUTH DAILY, Disp: 90 tablet, Rfl: 3   vitamin B-12 (CYANOCOBALAMIN) 1000 MCG tablet, Take 1,000 mcg by mouth daily., Disp: , Rfl:    Cholecalciferol (VITAMIN D3) 2000 units capsule, Take 2,000 Units by mouth daily. (Patient not taking: Reported on 06/22/2022), Disp: , Rfl:   Past Medical Problems: Past Medical History:  Diagnosis Date   Breast cancer 2017   Left Breast Cancer   Colon polyp    Diabetes mellitus without complication    Gout    Heart murmur    as a child   History of radiation therapy 04/19/16- 06/07/16   Left Breast/ 50.4 Gy in 28 fractions, Left Breast boosted/ 10 Gy in 5 fractions   Hyperlipidemia    Hypertension    Menopause    Personal history of  radiation therapy 2017   Left Breast Cancer   Tuberculosis    in childhood, treated.     Past Surgical History: Past Surgical History:  Procedure Laterality Date   BREAST BIOPSY     BREAST LUMPECTOMY Left 03/19/2016   Procedure: SEED LOCALIZED LEFT BREAST LUMPECTOMY;  Surgeon: Almond Lint, MD;  Location: [facility];  Service: General;  Laterality: Left;   COLONOSCOPY     MASS EXCISION  08/30/2011   Procedure: EXCISION MASS;  Surgeon: Almond Lint, MD;  Location: [facility];  Service: General;  Laterality: Right;  Excision of Right Axillary Mass    POLYPECTOMY      Social History: Social History   Socioeconomic History   Marital status: Single    Spouse name: Not on file   Number of children: Not on file   Years of education: Not on file   Highest education level: Not on file  Occupational History   Not on file  Tobacco Use   Smoking status: Never   Smokeless tobacco: Never  Vaping Use   Vaping Use: Never used  Substance and Sexual Activity   Alcohol use: Yes    Alcohol/week: 0.0 standard drinks of alcohol    Comment: occassionally   Drug use: No   Sexual activity: Not Currently    Birth control/protection: Post-menopausal  Other Topics Concern   Not on file  Social History Narrative   Single.  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     Social Determinants of Health   Financial Resource Strain: Not on file  Food Insecurity: Not on file  Transportation Needs: Not on file  Physical Activity: Not on file  Stress: Not on file  Social Connections: Not on file  Intimate Partner Violence: Not on file    Family History: Family History  Problem Relation Age of Onset   Colon cancer Mother 23   Lung cancer Father 7   Breast cancer Sister 49   Breast cancer Paternal Aunt    Diabetes Paternal Uncle    Seizures Brother    Colon cancer Brother 25   Asthma Other    Esophageal cancer Neg Hx    Stomach cancer Neg Hx      Review of Systems: ROS  Physical Exam: Vital Signs BP (!) 155/93 (BP Location: Right Arm, Patient Position: Sitting, Cuff Size: Large)   Pulse 92   Ht 5\' 5"  (1.651 m)   Wt 275 lb 6.4 oz (124.9 kg)   SpO2 98%   BMI 45.83 kg/m   Physical Exam Constitutional:      General: Not in acute distress.    Appearance:  Normal appearance. Not ill-appearing.  HENT:     Head: Normocephalic and atraumatic.  Eyes:     Pupils: Pupils are equal, round Neck:     Musculoskeletal: Normal range of motion.  Cardiovascular:     Rate and Rhythm: Normal rate    Pulses: Normal pulses.  Pulmonary:     Effort: Pulmonary effort is normal. No respiratory distress.  Musculoskeletal: Normal range of motion.  Skin:    General: Skin is warm and dry.     Findings: No erythema or rash.  Neurological:     General: No focal deficit present.     Mental Status: Alert and oriented to person, place, and time. Mental status is at baseline.     Motor: No weakness.  Psychiatric:        Mood and Affect: Mood normal.        Behavior: Behavior normal.    Assessment/Plan: The patient is scheduled for right breast reduction with Dr. Ulice Bold.  Risks, benefits, and alternatives of procedure discussed, questions answered and consent obtained.    Smoking Status: Non-smoker; Counseling Given?  N/A Last Mammogram: Diagnostic mammogram on 09/12/2022,; Results: Stable probably benign punctate calcifications in the left lateral breast lumpectomy bed consistent with fat necrosis.  Mammogram of bilateral breast on 06/06/2022.  No evidence of pregnancy in the right breast.  Caprini Score: 7, high; Risk Factors include: Age, history of left breast cancer, BMI greater than 40, and length of planned surgery. Recommendation for mechanical and possible pharmacological prophylaxis. Encourage early ambulation.  Will discuss possible need for DVT prophylaxis with Lovenox with Dr. Ulice Bold.  Pictures obtained:    Post-op Rx sent to pharmacy: Oxycodone, Zofran, Keflex  Patient was provided with the breast reduction and General Surgical Risk consent document and Pain Medication Agreement prior to their appointment.  They had adequate time to read through the risk consent documents and Pain Medication Agreement. We also discussed them in person together during this preop appointment. All of their questions were answered to their satisfaction.  Recommended calling if they have any further questions.  Risk consent form and Pain Medication Agreement to be scanned into patient's chart.  The risk that can be encountered with breast reduction were discussed and include the following but not limited to these:  Breast asymmetry, fluid accumulation, firmness of the breast, inability to breast feed, loss of nipple or areola, skin loss, decrease or no nipple sensation, fat necrosis of the breast tissue, bleeding, infection, healing delay.  There are risks of anesthesia, changes to skin sensation and injury to nerves or blood vessels.  The muscle can be temporarily or permanently injured.  You may have an allergic reaction to tape, suture, glue, blood products which can result in skin discoloration, swelling, pain, skin lesions, poor healing.  Any of these can lead to the need for revisonal surgery or stage procedures.  A reduction has potential to interfere with diagnostic procedures.  Nipple or breast piercing can increase risks of infection.  This procedure is best done when the breast is fully developed.  Changes in the breast will continue to occur over time.  Pregnancy can alter the outcomes of previous breast reduction surgery, weight gain and weigh loss can also effect the long term appearance.   We specifically discussed the incisions that are required for breast reduction surgery.  We discussed she will have an incision around the areola, down the front of her breast (vertical limb) and likely within the fold of  her  breast.  We discussed that the incision/scar will be there permanently, with time and will fade but it will always be present.  We discussed there are risks of hypertrophic scarring, keloid.  She has no history of this.  Patient is a diabetic with A1c 7.1 approximately 7 months ago.  We ordered an updated A1c today.  She is aware that if the A1c is higher we may need to postpone surgery for optimal A1c/diabetes control to decrease risk of complications.  We did specifically discussed that she is at an increased risk of postoperative wound healing complications and/or infection due to her diabetes  Electronically signed by: Kermit Balo Bradee Common, PA-C 10/04/2022 1:02 PM

## 2022-10-05 LAB — HEMOGLOBIN A1C
Est. average glucose Bld gHb Est-mCnc: 154 mg/dL
Hgb A1c MFr Bld: 7 % — ABNORMAL HIGH (ref 4.8–5.6)

## 2022-10-10 ENCOUNTER — Encounter (HOSPITAL_BASED_OUTPATIENT_CLINIC_OR_DEPARTMENT_OTHER): Payer: Self-pay | Admitting: Plastic Surgery

## 2022-10-10 ENCOUNTER — Other Ambulatory Visit: Payer: Self-pay

## 2022-10-12 ENCOUNTER — Encounter (HOSPITAL_BASED_OUTPATIENT_CLINIC_OR_DEPARTMENT_OTHER)
Admission: RE | Admit: 2022-10-12 | Discharge: 2022-10-12 | Disposition: A | Discharge status: Home / Self Care | Payer: Managed Care, Other (non HMO) | Source: Ambulatory Visit | Attending: Plastic Surgery | Admitting: Plastic Surgery

## 2022-10-12 DIAGNOSIS — Z719 Counseling, unspecified: Secondary | ICD-10-CM

## 2022-10-12 DIAGNOSIS — Z01818 Encounter for other preprocedural examination: Secondary | ICD-10-CM | POA: Diagnosis not present

## 2022-10-12 NOTE — Progress Notes (Signed)
Received call from Lab stating BMET is hemolyzed. Will repeat BMET day of surgery.

## 2022-10-17 ENCOUNTER — Ambulatory Visit (HOSPITAL_BASED_OUTPATIENT_CLINIC_OR_DEPARTMENT_OTHER)
Admission: RE | Admit: 2022-10-17 | Discharge: 2022-10-17 | Disposition: A | Discharge status: Home / Self Care | Payer: Managed Care, Other (non HMO) | Source: Ambulatory Visit | Attending: Plastic Surgery | Admitting: Plastic Surgery

## 2022-10-17 ENCOUNTER — Encounter (HOSPITAL_BASED_OUTPATIENT_CLINIC_OR_DEPARTMENT_OTHER): Payer: Self-pay | Admitting: Plastic Surgery

## 2022-10-17 ENCOUNTER — Other Ambulatory Visit: Payer: Self-pay

## 2022-10-17 ENCOUNTER — Encounter (HOSPITAL_BASED_OUTPATIENT_CLINIC_OR_DEPARTMENT_OTHER)
Admission: RE | Disposition: A | Discharge status: Home / Self Care | Payer: Self-pay | Source: Ambulatory Visit | Attending: Plastic Surgery

## 2022-10-17 ENCOUNTER — Ambulatory Visit (HOSPITAL_BASED_OUTPATIENT_CLINIC_OR_DEPARTMENT_OTHER): Payer: Managed Care, Other (non HMO) | Admitting: Anesthesiology

## 2022-10-17 DIAGNOSIS — Z7984 Long term (current) use of oral hypoglycemic drugs: Secondary | ICD-10-CM | POA: Diagnosis not present

## 2022-10-17 DIAGNOSIS — Z6841 Body Mass Index (BMI) 40.0 and over, adult: Secondary | ICD-10-CM | POA: Diagnosis not present

## 2022-10-17 DIAGNOSIS — Z01818 Encounter for other preprocedural examination: Secondary | ICD-10-CM

## 2022-10-17 DIAGNOSIS — Z9989 Dependence on other enabling machines and devices: Secondary | ICD-10-CM

## 2022-10-17 DIAGNOSIS — I10 Essential (primary) hypertension: Secondary | ICD-10-CM | POA: Diagnosis not present

## 2022-10-17 DIAGNOSIS — G4733 Obstructive sleep apnea (adult) (pediatric): Secondary | ICD-10-CM

## 2022-10-17 DIAGNOSIS — N6489 Other specified disorders of breast: Secondary | ICD-10-CM | POA: Insufficient documentation

## 2022-10-17 DIAGNOSIS — Z853 Personal history of malignant neoplasm of breast: Secondary | ICD-10-CM | POA: Diagnosis not present

## 2022-10-17 DIAGNOSIS — E119 Type 2 diabetes mellitus without complications: Secondary | ICD-10-CM

## 2022-10-17 DIAGNOSIS — Z923 Personal history of irradiation: Secondary | ICD-10-CM | POA: Insufficient documentation

## 2022-10-17 DIAGNOSIS — N651 Disproportion of reconstructed breast: Secondary | ICD-10-CM | POA: Diagnosis not present

## 2022-10-17 DIAGNOSIS — N62 Hypertrophy of breast: Secondary | ICD-10-CM | POA: Insufficient documentation

## 2022-10-17 DIAGNOSIS — G4730 Sleep apnea, unspecified: Secondary | ICD-10-CM | POA: Diagnosis not present

## 2022-10-17 DIAGNOSIS — Z9012 Acquired absence of left breast and nipple: Secondary | ICD-10-CM | POA: Insufficient documentation

## 2022-10-17 DIAGNOSIS — N6021 Fibroadenosis of right breast: Secondary | ICD-10-CM | POA: Diagnosis not present

## 2022-10-17 HISTORY — DX: Sleep apnea, unspecified: G47.30

## 2022-10-17 HISTORY — PX: BREAST REDUCTION SURGERY: SHX8

## 2022-10-17 LAB — BASIC METABOLIC PANEL
Anion gap: 11 (ref 5–15)
BUN: 13 mg/dL (ref 6–20)
CO2: 24 mmol/L (ref 22–32)
Calcium: 9.6 mg/dL (ref 8.9–10.3)
Chloride: 104 mmol/L (ref 98–111)
Creatinine, Ser: 0.8 mg/dL (ref 0.44–1.00)
GFR, Estimated: >60 mL/min (ref >60)
Glucose, Bld: 118 mg/dL — ABNORMAL HIGH (ref 70–99)
Potassium: 3.9 mmol/L (ref 3.5–5.1)
Sodium: 139 mmol/L (ref 135–145)

## 2022-10-17 LAB — GLUCOSE, CAPILLARY
Glucose-Capillary: 117 mg/dL — ABNORMAL HIGH (ref 70–99)
Glucose-Capillary: 130 mg/dL — ABNORMAL HIGH (ref 70–99)

## 2022-10-17 SURGERY — BREAST REDUCTION WITH LIPOSUCTION
Anesthesia: General | Site: Breast | Laterality: Right

## 2022-10-17 MED ORDER — MIDAZOLAM HCL 5 MG/5ML IJ SOLN
INTRAMUSCULAR | Status: DC | PRN
  Administered 2022-10-17: 2 mg via INTRAVENOUS

## 2022-10-17 MED ORDER — PROPOFOL 10 MG/ML IV BOLUS
INTRAVENOUS | Status: DC | PRN
  Administered 2022-10-17: 200 mg via INTRAVENOUS

## 2022-10-17 MED ORDER — KETOROLAC TROMETHAMINE 30 MG/ML IJ SOLN
INTRAMUSCULAR | Status: AC
  Filled 2022-10-17: qty 1

## 2022-10-17 MED ORDER — OXYCODONE HCL 5 MG PO TABS
ORAL_TABLET | ORAL | Status: AC
  Filled 2022-10-17: qty 1

## 2022-10-17 MED ORDER — FENTANYL CITRATE (PF) 100 MCG/2ML IJ SOLN
INTRAMUSCULAR | Status: DC | PRN
  Administered 2022-10-17: 100 ug via INTRAVENOUS

## 2022-10-17 MED ORDER — CHLORHEXIDINE GLUCONATE CLOTH 2 % EX PADS: 6.0000 | MEDICATED_PAD | Freq: Once | CUTANEOUS | Status: DC

## 2022-10-17 MED ORDER — ONDANSETRON HCL 4 MG/2ML IJ SOLN
INTRAMUSCULAR | Status: DC | PRN
  Administered 2022-10-17: 4 mg via INTRAVENOUS

## 2022-10-17 MED ORDER — ONDANSETRON HCL 4 MG/2ML IJ SOLN: 4.0000 mg | Freq: Once | INTRAMUSCULAR | Status: DC | PRN

## 2022-10-17 MED ORDER — DEXAMETHASONE SODIUM PHOSPHATE 4 MG/ML IJ SOLN
INTRAMUSCULAR | Status: DC | PRN
  Administered 2022-10-17: 10 mg via INTRAVENOUS

## 2022-10-17 MED ORDER — MEPERIDINE HCL 25 MG/ML IJ SOLN: 6.2500 mg | INTRAMUSCULAR | Status: DC | PRN

## 2022-10-17 MED ORDER — ACETAMINOPHEN 160 MG/5ML PO SOLN: 325.0000 mg | ORAL | Status: DC | PRN

## 2022-10-17 MED ORDER — PHENYLEPHRINE HCL (PRESSORS) 10 MG/ML IV SOLN
INTRAVENOUS | Status: DC | PRN
  Administered 2022-10-17: 80 ug via INTRAVENOUS
  Administered 2022-10-17: 160 ug via INTRAVENOUS

## 2022-10-17 MED ORDER — FENTANYL CITRATE (PF) 100 MCG/2ML IJ SOLN
25.0000 ug | INTRAMUSCULAR | Status: DC | PRN
  Administered 2022-10-17 (×2): 25 ug via INTRAVENOUS

## 2022-10-17 MED ORDER — ROCURONIUM BROMIDE 100 MG/10ML IV SOLN
INTRAVENOUS | Status: DC | PRN
  Administered 2022-10-17: 70 mg via INTRAVENOUS

## 2022-10-17 MED ORDER — OXYCODONE HCL 5 MG PO TABS
5.0000 mg | ORAL_TABLET | Freq: Once | ORAL | Status: AC | PRN
  Administered 2022-10-17: 5 mg via ORAL

## 2022-10-17 MED ORDER — BUPIVACAINE LIPOSOME 1.3 % IJ SUSP
INTRAMUSCULAR | Status: AC
  Filled 2022-10-17: qty 20

## 2022-10-17 MED ORDER — OXYCODONE HCL 5 MG/5ML PO SOLN: 5.0000 mg | Freq: Once | ORAL | Status: AC | PRN

## 2022-10-17 MED ORDER — ACETAMINOPHEN 325 MG PO TABS: 325.0000 mg | ORAL_TABLET | ORAL | Status: DC | PRN

## 2022-10-17 MED ORDER — LIDOCAINE HCL 1 % IJ SOLN
INTRAVENOUS | Status: DC | PRN
  Administered 2022-10-17: 500 mL

## 2022-10-17 MED ORDER — CEFAZOLIN IN SODIUM CHLORIDE 3-0.9 GM/100ML-% IV SOLN
3.0000 g | INTRAVENOUS | Status: AC
  Administered 2022-10-17: 3 g via INTRAVENOUS

## 2022-10-17 MED ORDER — LIDOCAINE-EPINEPHRINE 1 %-1:100000 IJ SOLN
INTRAMUSCULAR | Status: DC | PRN
  Administered 2022-10-17: 30 mL via INTRAMUSCULAR

## 2022-10-17 MED ORDER — LIDOCAINE HCL (PF) 1 % IJ SOLN
INTRAMUSCULAR | Status: AC
  Filled 2022-10-17: qty 30

## 2022-10-17 MED ORDER — SODIUM CHLORIDE 0.9 % IV SOLN
INTRAVENOUS | Status: DC | PRN
  Administered 2022-10-17: 20 mL

## 2022-10-17 MED ORDER — KETOROLAC TROMETHAMINE 30 MG/ML IJ SOLN
30.0000 mg | Freq: Once | INTRAMUSCULAR | Status: AC | PRN
  Administered 2022-10-17: 30 mg via INTRAVENOUS

## 2022-10-17 MED ORDER — MIDAZOLAM HCL 2 MG/2ML IJ SOLN
INTRAMUSCULAR | Status: AC
  Filled 2022-10-17: qty 2

## 2022-10-17 MED ORDER — SUGAMMADEX SODIUM 500 MG/5ML IV SOLN: INTRAVENOUS | Status: DC | PRN

## 2022-10-17 MED ORDER — LACTATED RINGERS IV SOLN: INTRAVENOUS | Status: DC

## 2022-10-17 MED ORDER — SUGAMMADEX SODIUM 500 MG/5ML IV SOLN
INTRAVENOUS | Status: DC | PRN
  Administered 2022-10-17: 250 mg via INTRAVENOUS

## 2022-10-17 MED ORDER — FENTANYL CITRATE (PF) 100 MCG/2ML IJ SOLN
INTRAMUSCULAR | Status: AC
  Filled 2022-10-17: qty 2

## 2022-10-17 MED ORDER — CEFAZOLIN IN SODIUM CHLORIDE 3-0.9 GM/100ML-% IV SOLN
INTRAVENOUS | Status: AC
  Filled 2022-10-17: qty 100

## 2022-10-17 SURGICAL SUPPLY — 66 items
ADH SKN CLS APL DERMABOND .7 (GAUZE/BANDAGES/DRESSINGS) ×2
AGENT HMST PWDR BTL CLGN 5GM (Miscellaneous) ×1 IMPLANT
BAG DECANTER FOR FLEXI CONT (MISCELLANEOUS) ×1 IMPLANT
BINDER BREAST LRG (GAUZE/BANDAGES/DRESSINGS) IMPLANT
BINDER BREAST MEDIUM (GAUZE/BANDAGES/DRESSINGS) IMPLANT
BINDER BREAST XLRG (GAUZE/BANDAGES/DRESSINGS) IMPLANT
BINDER BREAST XXLRG (GAUZE/BANDAGES/DRESSINGS) IMPLANT
BIOPATCH RED 1 DISK 7.0 (GAUZE/BANDAGES/DRESSINGS) IMPLANT
BLADE HEX COATED 2.75 (ELECTRODE) IMPLANT
BLADE KNIFE PERSONA 10 (BLADE) ×2 IMPLANT
BLADE SURG 15 STRL LF DISP TIS (BLADE) ×1 IMPLANT
BLADE SURG 15 STRL SS (BLADE) ×1
CANISTER SUCT 1200ML W/VALVE (MISCELLANEOUS) ×1 IMPLANT
COLLAGEN CELLERATERX 5 GRAM (Miscellaneous) IMPLANT
COVER BACK TABLE 60X90IN (DRAPES) ×1 IMPLANT
COVER MAYO STAND STRL (DRAPES) ×1 IMPLANT
DERMABOND ADVANCED .7 DNX12 (GAUZE/BANDAGES/DRESSINGS) ×2 IMPLANT
DRAIN CHANNEL 19F RND (DRAIN) IMPLANT
DRAPE LAPAROSCOPIC ABDOMINAL (DRAPES) ×1 IMPLANT
DRSG MEPILEX POST OP 4X8 (GAUZE/BANDAGES/DRESSINGS) ×2 IMPLANT
ELECT BLADE 4.0 EZ CLEAN MEGAD (MISCELLANEOUS) ×1
ELECT REM PT RETURN 9FT ADLT (ELECTROSURGICAL) ×1
ELECTRODE BLDE 4.0 EZ CLN MEGD (MISCELLANEOUS) ×1 IMPLANT
ELECTRODE REM PT RTRN 9FT ADLT (ELECTROSURGICAL) ×1 IMPLANT
EVACUATOR SILICONE 100CC (DRAIN) IMPLANT
GAUZE PAD ABD 8X10 STRL (GAUZE/BANDAGES/DRESSINGS) ×2 IMPLANT
GLOVE BIO SURGEON STRL SZ 6.5 (GLOVE) ×3 IMPLANT
GLOVE BIO SURGEON STRL SZ7.5 (GLOVE) ×1 IMPLANT
GLOVE SURG SS PI 6.5 STRL IVOR (GLOVE) IMPLANT
GLOVE SURG SS PI 7.5 STRL IVOR (GLOVE) IMPLANT
GOWN STRL REUS W/ TWL LRG LVL3 (GOWN DISPOSABLE) ×2 IMPLANT
GOWN STRL REUS W/ TWL XL LVL3 (GOWN DISPOSABLE) ×1 IMPLANT
GOWN STRL REUS W/TWL LRG LVL3 (GOWN DISPOSABLE) ×2
GOWN STRL REUS W/TWL XL LVL3 (GOWN DISPOSABLE) ×1
NDL FILTER BLUNT 18X1 1/2 (NEEDLE) IMPLANT
NDL HYPO 25X1 1.5 SAFETY (NEEDLE) ×1 IMPLANT
NEEDLE FILTER BLUNT 18X1 1/2 (NEEDLE) IMPLANT
NEEDLE HYPO 25X1 1.5 SAFETY (NEEDLE) ×1 IMPLANT
NS IRRIG 1000ML POUR BTL (IV SOLUTION) ×1 IMPLANT
PACK BASIN DAY SURGERY FS (CUSTOM PROCEDURE TRAY) ×1 IMPLANT
PAD ALCOHOL SWAB (MISCELLANEOUS) IMPLANT
PAD FOAM SILICONE BACKED (GAUZE/BANDAGES/DRESSINGS) IMPLANT
PENCIL SMOKE EVACUATOR (MISCELLANEOUS) ×1 IMPLANT
PIN SAFETY STERILE (MISCELLANEOUS) IMPLANT
SLEEVE SCD COMPRESS KNEE MED (STOCKING) ×1 IMPLANT
SPIKE FLUID TRANSFER (MISCELLANEOUS) IMPLANT
SPONGE T-LAP 18X18 ~~LOC~~+RFID (SPONGE) ×2 IMPLANT
STRIP SUTURE WOUND CLOSURE 1/2 (MISCELLANEOUS) ×2 IMPLANT
SUT MNCRL AB 4-0 PS2 18 (SUTURE) ×4 IMPLANT
SUT MON AB 3-0 SH 27 (SUTURE) ×4
SUT MON AB 3-0 SH27 (SUTURE) ×4 IMPLANT
SUT MON AB 5-0 PS2 18 (SUTURE) IMPLANT
SUT PDS 3-0 CT2 (SUTURE) ×4
SUT PDS II 3-0 CT2 27 ABS (SUTURE) ×4 IMPLANT
SUT SILK 3 0 PS 1 (SUTURE) IMPLANT
SYR 50ML LL SCALE MARK (SYRINGE) IMPLANT
SYR BULB IRRIG 60ML STRL (SYRINGE) ×1 IMPLANT
SYR CONTROL 10ML LL (SYRINGE) ×1 IMPLANT
TAPE MEASURE VINYL STERILE (MISCELLANEOUS) IMPLANT
TOWEL GREEN STERILE FF (TOWEL DISPOSABLE) ×2 IMPLANT
TRAY DSU PREP LF (CUSTOM PROCEDURE TRAY) ×1 IMPLANT
TUBE CONNECTING 20X1/4 (TUBING) ×1 IMPLANT
TUBING INFILTRATION IT-10001 (TUBING) IMPLANT
TUBING SET GRADUATE ASPIR 12FT (MISCELLANEOUS) IMPLANT
UNDERPAD 30X36 HEAVY ABSORB (UNDERPADS AND DIAPERS) ×2 IMPLANT
YANKAUER SUCT BULB TIP NO VENT (SUCTIONS) ×1 IMPLANT

## 2022-10-17 NOTE — Discharge Instructions (Addendum)
INSTRUCTIONS FOR AFTER BREAST SURGERY   You will likely have some questions about what to expect following your operation.  The following information will help you and your family understand what to expect when you are discharged from the hospital.  It is important to follow these guidelines to help ensure a smooth recovery and reduce complication.  Postoperative instructions include information on: diet, wound care, medications and physical activity.  AFTER SURGERY Expect to go home after the procedure.  In some cases, you may need to spend one night in the hospital for observation.  DIET Breast surgery does not require a specific diet.  However, the healthier you eat the better your body will heal. It is important to increasing your protein intake.  This means limiting the foods with sugar and carbohydrates.  Focus on vegetables and some meat.  If you have liposuction during your procedure be sure to drink water.  If your urine is bright yellow, then it is concentrated, and you need to drink more water.  As a general rule after surgery, you should have 8 ounces of water every hour while awake.  If you find you are persistently nauseated or unable to take in liquids let us know.  NO TOBACCO USE or EXPOSURE.  This will slow your healing process and lead to a wound.  WOUND CARE Leave the binder on for 3 days . Use fragrance free soap like Dial, Dove or [place].   After 3 days you can remove the binder to shower. Once dry apply binder or sports bra. If you have liposuction you will have a soft and spongy dressing (Lipofoam) that helps prevent creases in your skin.  Remove before you shower and then replace it.  It is also available on [employer]. If you have steri-strips / tape directly attached to your skin leave them in place. It is OK to get these wet.   No baths, pools or hot tubs for four weeks. We close your incision to leave the smallest and best-looking scar. No ointment or creams on your incisions  for four weeks.  No Neosporin (Too many skin reactions).  A few weeks after surgery you can use Mederma and start massaging the scar. We ask you to wear your binder or sports bra for the first 6 weeks around the clock, including while sleeping. This provides added comfort and helps reduce the fluid accumulation at the surgery site. NO Ice or heating pads to the operative site.  You have a very high risk of a BURN before you feel the temperature change.  ACTIVITY No heavy lifting until cleared by the doctor.  This usually means no more than a half-gallon of milk.  It is OK to walk and climb stairs. Moving your legs is very important to decrease your risk of a blood clot.  It will also help keep you from getting deconditioned.  Every 1 to 2 hours get up and walk for 5 minutes. This will help with a quicker recovery back to normal.  Let pain be your guide so you don't do too much.  This time is for you to recover.  You will be more comfortable if you sleep and rest with your head elevated either with a few pillows under you or in a recliner.  No stomach sleeping for a three months.  WORK Everyone returns to work at different times. As a rough guide, most people take at least 1 - 2 weeks off prior to returning to work. If  you need documentation for your job, give the forms to the front staff at the clinic.  DRIVING Arrange for someone to bring you home from the hospital after your surgery.  You may be able to drive a few days after surgery but not while taking any narcotics or valium.  BOWEL MOVEMENTS Constipation can occur after anesthesia and while taking pain medication.  It is important to stay ahead for your comfort.  We recommend taking Milk of Magnesia (2 tablespoons; twice a day) while taking the pain pills.  MEDICATIONS You may be prescribed should start after surgery At your preoperative visit for you history and physical you may have been given the following medications: An antibiotic: Start  this medication when you get home and take according to the instructions on the bottle. Zofran 4 mg:  This is to treat nausea and vomiting.  You can take this every 6 hours as needed and only if needed. Valium 2 mg for breast cancer patients: This is for muscle tightness if you have an implant or expander. This will help relax your muscle which also helps with pain control.  This can be taken every 12 hours as needed. Don't drive after taking this medication. Norco (hydrocodone/acetaminophen) 5/325 mg:  This is only to be used after you have taken the Motrin or the Tylenol. Every 8 hours as needed.   Over the counter Medication to take: Ibuprofen (Motrin) 600 mg:  Take this every 6 hours.  If you have additional pain then take 500 mg of the Tylenol every 8 hours.  Only take the Norco after you have tried these two. MiraLAX or Milk of Magnesia: Take this according to the bottle if you take the Norco.  WHEN TO CALL Call your surgeon's office if any of the following occur: Fever 101 degrees F or greater Excessive bleeding or fluid from the incision site. Pain that increases over time without aid from the medications Redness, warmth, or pus draining from incision sites Persistent nausea or inability to take in liquids Severe misshapen area that underwent the operation.    Post Anesthesia Home Care Instructions  Activity: Get plenty of rest for the remainder of the day. A responsible individual must stay with you for 24 hours following the procedure.  For the next 24 hours, DO NOT: -Drive a car -Advertising copywriter -Drink alcoholic beverages -Take any medication unless instructed by your physician -Make any legal decisions or sign important papers.  Meals: Start with liquid foods such as gelatin or soup. Progress to regular foods as tolerated. Avoid greasy, spicy, heavy foods. If nausea and/or vomiting occur, drink only clear liquids until the nausea and/or vomiting subsides. Call your  physician if vomiting continues.  Special Instructions/Symptoms: Your throat may feel dry or sore from the anesthesia or the breathing tube placed in your throat during surgery. If this causes discomfort, gargle with warm salt water. The discomfort should disappear within 24 hours.  If you had a scopolamine patch placed behind your ear for the management of post- operative nausea and/or vomiting:  1. The medication in the patch is effective for 72 hours, after which it should be removed.  Wrap patch in a tissue and discard in the trash. Wash hands thoroughly with soap and water. 2. You may remove the patch earlier than 72 hours if you experience unpleasant side effects which may include dry mouth, dizziness or visual disturbances. 3. Avoid touching the patch. Wash your hands with soap and water after contact with  the patch.     May have Ibuprofen today after 7:32 PM

## 2022-10-17 NOTE — Interval H&P Note (Signed)
History and Physical Interval Note:  10/17/2022 10:59 AM  Kim Roberts  has presented today for surgery, with the diagnosis of Postoperative breast asymmetry.  The various methods of treatment have been discussed with the patient and family. After consideration of risks, benefits and other options for treatment, the patient has consented to  Procedure(s): BREAST REDUCTION WITH LIPOSUCTION (Right) as a surgical intervention.  The patient's history has been reviewed, patient examined, no change in status, stable for surgery.  I have reviewed the patient's chart and labs.  Questions were answered to the patient's satisfaction.     [name] [name]

## 2022-10-17 NOTE — Anesthesia Postprocedure Evaluation (Signed)
Anesthesia Post Note  Patient: Kim Roberts [facility]  Procedure(s) Performed: BREAST REDUCTION WITH LIPOSUCTION (Right: Breast)     Patient location during evaluation: Phase II Anesthesia Type: General Level of consciousness: awake Pain management: pain level controlled Vital Signs Assessment: post-procedure vital signs reviewed and stable Respiratory status: spontaneous breathing Cardiovascular status: stable Postop Assessment: no apparent nausea or vomiting Anesthetic complications: no  No notable events documented.  Last Vitals:  Vitals:   10/17/22 1358 10/17/22 1409  BP: 134/81 (!) 160/84  Pulse: 98 99  Resp: 14 18  Temp:  36.8 C  SpO2: 98% 95%    Last Pain:  Vitals:   10/17/22 1409  TempSrc: Oral  PainSc: 2                  Caren Macadam

## 2022-10-17 NOTE — Anesthesia Preprocedure Evaluation (Signed)
Anesthesia Evaluation  Patient identified by MRN, date of birth, ID band Patient awake    Reviewed: Allergy & Precautions, NPO status , Patient's Chart, lab work & pertinent test results  Airway Mallampati: II       Dental no notable dental hx.    Pulmonary sleep apnea and Continuous Positive Airway Pressure Ventilation    Pulmonary exam normal        Cardiovascular hypertension, Pt. on medications Normal cardiovascular exam     Neuro/Psych negative neurological ROS  negative psych ROS   GI/Hepatic   Endo/Other  diabetes, Type 2, Oral Hypoglycemic Agents  Morbid obesity  Renal/GU   negative genitourinary   Musculoskeletal negative musculoskeletal ROS (+)    Abdominal  (+) + obese  Peds  Hematology   Anesthesia Other Findings   Reproductive/Obstetrics                             Anesthesia Physical Anesthesia Plan  ASA: 3  Anesthesia Plan: General   Post-op Pain Management:    Induction: Intravenous  PONV Risk Score and Plan: 3 and Ondansetron, Midazolam and Treatment may vary due to age or medical condition  Airway Management Planned: LMA and Oral ETT  Additional Equipment: None  Intra-op Plan:   Post-operative Plan: Extubation in OR  Informed Consent: I have reviewed the patients History and Physical, chart, labs and discussed the procedure including the risks, benefits and alternatives for the proposed anesthesia with the patient or authorized representative who has indicated his/her understanding and acceptance.     Dental advisory given  Plan Discussed with: CRNA  Anesthesia Plan Comments:        Anesthesia Quick Evaluation

## 2022-10-17 NOTE — Anesthesia Procedure Notes (Signed)
Procedure Name: Intubation Date/Time: 10/17/2022 11:49 AM  Performed by: Ronnette Hila, CRNAPre-anesthesia Checklist: Patient identified, Emergency Drugs available, Suction available and Patient being monitored Patient Re-evaluated:Patient Re-evaluated prior to induction Oxygen Delivery Method: Circle system utilized Preoxygenation: Pre-oxygenation with 100% oxygen Induction Type: IV induction Ventilation: Mask ventilation without difficulty Laryngoscope Size: Mac and 3 Grade View: Grade II Tube type: Oral Number of attempts: 1 Airway Equipment and Method: Stylet and Oral airway Placement Confirmation: ETT inserted through vocal cords under direct vision, positive ETCO2 and breath sounds checked- equal and bilateral Secured at: 22 cm Tube secured with: Tape Dental Injury: Teeth and Oropharynx as per pre-operative assessment

## 2022-10-17 NOTE — Op Note (Signed)
Breast Reduction Op note:    DATE OF PROCEDURE: 10/17/2022  LOCATION: [facility]  SURGEON: Foster Simpson, DO  ASSISTANT: Keenan Bachelor, PA  PREOPERATIVE DIAGNOSIS 1. Breast asymmetry after left breast cancer 2. Macromastia, Neck Pain, Back Pain  POSTOPERATIVE DIAGNOSIS Same as preoperative diagnosis  PROCEDURES 1. Right breast reduction.  Right reduction 800 g  COMPLICATIONS: None.  DRAINS: none  INDICATIONS FOR PROCEDURE Kim Roberts is a 62 y.o. year-old female born on 1961-06-06,with a history of symptomatic macromastia with concominant back pain, neck pain, shoulder grooving from her bra.   MRN: [id number]  CONSENT Informed consent was obtained directly from the patient. The risks, benefits and alternatives were fully discussed. Specific risks including but not limited to bleeding, infection, hematoma, seroma, scarring, pain, nipple necrosis, asymmetry, poor cosmetic results, and need for further surgery were discussed. The patient's questions were answered.  DESCRIPTION OF PROCEDURE  Patient was brought into the operating room and rested on the operating room table in the supine position.  SCDs were placed and appropriate padding was performed.  Antibiotics were given. The patient underwent general anesthesia and the chest was prepped and draped in a sterile fashion.  A timeout was performed and all information was confirmed to be correct by those in the room. Tumescent was placed in the lateral breast and liposuction was done for improved symmetry and contour.   Right side: Preoperative markings were confirmed.  Incision lines were injected with local containing epinephrine.  After waiting for vasoconstriction, the marked lines were incised with a #15 blade.  A Wise-pattern superomedial breast reduction was performed by de-epithelializing the pedicle, using bovie to create the superomedial pedicle, and removing breast tissue from the superior,  lateral, and inferior portions of the breast.  Experel and cellerate were placed in the pocket. Care was taken to not undermine the breast pedicle. Hemostasis was achieved.  The nipple was gently rotated into position and the soft tissue closed with 4-0 Monocryl.   The pocket was irrigated and hemostasis confirmed.  The deep tissues were approximated with 3-0 PDS sutures.  The skin was closed with deep dermal 3-0 Monocryl and subcuticular 4-0 Monocryl sutures.  The nipple and skin had good capillary refill at the end of the procedure.  Dermabond was applied.  A breast binder and ABDs were placed.  The nipple and skin flap had good capillary refill at the end of the procedure.  The patient tolerated the procedure well. The patient was allowed to wake from anesthesia and taken to the recovery room in satisfactory condition.  The advanced practice practitioner (APP) assisted throughout the case.  The APP was essential in retraction and counter traction when needed to make the case progress smoothly.  This retraction and assistance made it possible to see the tissue plans for the procedure.  The assistance was needed for blood control, tissue re-approximation and assisted with closure of the incision site.

## 2022-10-17 NOTE — Transfer of Care (Signed)
Immediate Anesthesia Transfer of Care Note  Patient: Kim Roberts [facility]  Procedure(s) Performed: BREAST REDUCTION WITH LIPOSUCTION (Right: Breast)  Patient Location: PACU  Anesthesia Type:General  Level of Consciousness: awake, drowsy, and patient cooperative  Airway & Oxygen Therapy: Patient Spontanous Breathing and Patient connected to face mask oxygen  Post-op Assessment: Report given to RN and Post -op Vital signs reviewed and stable  Post vital signs: Reviewed and stable  Last Vitals:  Vitals Value Taken Time  BP 156/85 10/17/22 1303  Temp    Pulse 96 10/17/22 1308  Resp 21 10/17/22 1308  SpO2 96 % 10/17/22 1308  Vitals shown include unvalidated device data.  Last Pain:  Vitals:   10/17/22 0958  TempSrc: Temporal  PainSc: 0-No pain         Complications: No notable events documented.

## 2022-10-18 ENCOUNTER — Encounter (HOSPITAL_BASED_OUTPATIENT_CLINIC_OR_DEPARTMENT_OTHER): Payer: Self-pay | Admitting: Plastic Surgery

## 2022-10-19 LAB — SURGICAL PATHOLOGY

## 2022-10-26 ENCOUNTER — Ambulatory Visit (INDEPENDENT_AMBULATORY_CARE_PROVIDER_SITE_OTHER): Payer: Managed Care, Other (non HMO) | Admitting: Plastic Surgery

## 2022-10-26 DIAGNOSIS — Z9889 Other specified postprocedural states: Secondary | ICD-10-CM

## 2022-10-26 DIAGNOSIS — N6489 Other specified disorders of breast: Secondary | ICD-10-CM

## 2022-10-26 NOTE — Progress Notes (Signed)
The patient is a 62 year old female here for follow-up after undergoing a right breast reduction.  She has a little bruising and swelling especially laterally.  Overall she is doing really well.  She is very pleased and has much better symmetry.  I would like her to continue with the compression or sports bra and the Lipo foam.  Will plan to see her in the next week or so.  No sign of infection or seroma.  Pictures were obtained of the patient and placed in the chart with the patient's or guardian's permission.

## 2022-11-02 ENCOUNTER — Telehealth: Payer: Self-pay | Admitting: Surgical

## 2022-11-02 NOTE — Telephone Encounter (Signed)
Pt called checking on FMLA papers.  She stated that her company is saying it has to be in by Monday 5/27.

## 2022-11-02 NOTE — Telephone Encounter (Signed)
We will attempt to have them completed by then, but we just received them yesterday and require a 1 week turn around.

## 2022-11-07 ENCOUNTER — Encounter: Payer: Managed Care, Other (non HMO) | Admitting: Physician Assistant

## 2022-11-07 ENCOUNTER — Ambulatory Visit (INDEPENDENT_AMBULATORY_CARE_PROVIDER_SITE_OTHER): Payer: Managed Care, Other (non HMO) | Admitting: Physician Assistant

## 2022-11-07 ENCOUNTER — Encounter: Payer: Self-pay | Admitting: Surgical

## 2022-11-07 DIAGNOSIS — Z9889 Other specified postprocedural states: Secondary | ICD-10-CM

## 2022-11-07 NOTE — Progress Notes (Signed)
Patient is a 62 year old female with history of left breast cancer status post partial mastectomy.  She recently went right breast reduction for symmetry with Dr. Ulice Bold on 10/17/2022.  Intraoperatively, patient had 800 g removed from her right breast.  Patient is 3 weeks postop.  Patient presents to the clinic today for postoperative follow-up.  Patient was last seen in the clinic on 10/26/2022.  At this visit, patient was doing well.  She did have a little bit of bruising and swelling laterally.  Plan was for patient to continue compression and Lipo foam.  Today,

## 2022-11-07 NOTE — Progress Notes (Signed)
Patient is a 62 year old female with history of left breast cancer status post partial mastectomy s/p right-sided breast reduction for symmetry performed 10/17/2022 by Dr. Ulice Bold who presents to clinic for postoperative follow-up.  Reviewed operative report and patient had 800 g removed from her right breast.  Patient is 3 weeks postop.  She was last seen in the clinic on 10/26/2022.  At this visit, patient was doing well.  She did have a little bit of bruising and swelling laterally.  Plan was for patient to continue compression and Lipo foam.  Today, patient is doing well.  She states that she does feel as though the symmetry is improved, although the right breast does feel more projected than the left.  She also states that the Steri-Strips remain firmly intact and that she still has the residual purple around the areola.  She inquires about returning to work next week.  She does not have a physically demanding job.  She states that she periodically will have burning/shooting pains that are fleeting.  Denies any constant aching or asymmetric swelling.  Denies any leg swelling, fevers, chest pain, or difficulty breathing.  On exam, right breast is soft throughout.  NAC is healthy and viable.  Some residual Dermabond noted around the areola.  The Steri-Strips were removed without complication or difficulty.  Vertical limb CDI.  Single suture removed from the inferior aspect.  No surrounding erythema or other concerning findings.  Recommend Vaseline around all of her incisions 1-2 times daily for the next 1 to 2 weeks.  This will help with the residual skin glue.  As for her discomfort, she describes neuropathic pain.  Provided reassurance that it would likely continue to improve with time.  Do not feel as though she needs any extension on her return to work given benign exam.  She understands and is agreeable.  Discussed scar mitigation treatments and mechanical massage of the incisions starting in 1 to 2  weeks, after the residual Dermabond has all sloughed off.  She has a final appointment already scheduled and plans to return.  Will have final postoperative photos obtained at that time.

## 2022-11-09 ENCOUNTER — Encounter: Payer: Self-pay | Admitting: Family Medicine

## 2022-11-09 MED ORDER — POTASSIUM CHLORIDE ER 10 MEQ PO TBCR: 10.0000 meq | EXTENDED_RELEASE_TABLET | Freq: Every day | ORAL | 1 refills | Status: DC

## 2022-11-09 MED ORDER — LISINOPRIL 5 MG PO TABS: 5.0000 mg | ORAL_TABLET | Freq: Every day | ORAL | 1 refills | Status: DC

## 2022-11-21 ENCOUNTER — Encounter: Payer: Managed Care, Other (non HMO) | Admitting: Physician Assistant

## 2022-11-28 ENCOUNTER — Ambulatory Visit (INDEPENDENT_AMBULATORY_CARE_PROVIDER_SITE_OTHER): Payer: Managed Care, Other (non HMO) | Admitting: Physician Assistant

## 2022-11-28 DIAGNOSIS — Z9889 Other specified postprocedural states: Secondary | ICD-10-CM

## 2022-11-28 NOTE — Progress Notes (Signed)
This is a 62 year old female with a significant past medical history of left-sided breast cancer status post partial mastectomy status post right-sided breast reduction for symmetry performed on 10/17/2022 by Dr. Ulice Bold.  She was last seen in the office on 11/07/2022.  At that time she had been doing well.  She does have asymmetry of the breasts.  She occasionally has a burning shooting pain in the surgical region, this is brief.  She denies any infectious signs or symptoms.  Chaperone present.  On exam right breast is soft with no palpable areas of firmness or fluid collection.  Right NAC is viable.  Incisions are clean dry and intact with routine healing.  Overall the patient is doing very well from a postoperative standpoint.  Her incisions have healed very well.  From a surgical standpoint she does not require any further evaluation or management.  We encouraged her to reach out to our office and follow-up as needed or if she has any concerns.  She did discuss that she would like to try and lose some weight, presently no surgical options would be beneficial for her.  I did place a referral for healthy weight and wellness.  The patient understands to reach out to our office with any questions or concerns.

## 2023-01-29 ENCOUNTER — Telehealth: Payer: Self-pay | Admitting: Family Medicine

## 2023-01-29 NOTE — Telephone Encounter (Signed)
Pt called in stated she had her BP check and it was 168/96 would like a call back to discuss adjusting BP medication . Please advise [phone]

## 2023-01-30 MED ORDER — LISINOPRIL 10 MG PO TABS: 10.0000 mg | ORAL_TABLET | Freq: Every day | ORAL | 0 refills | Status: DC

## 2023-01-30 NOTE — Telephone Encounter (Signed)
Please triage patient and let me now about other BP readings.  Thanks.

## 2023-01-30 NOTE — Telephone Encounter (Signed)
Would inc lisinopril to 10mg  a day, rx sent.  Needs yearly f/u scheduled here for this fall.  Update [place] if BP is still >140/>90 after 1 week on the higher dose. Thanks.

## 2023-01-30 NOTE — Telephone Encounter (Signed)
I spoke with pt; pt said on 01/29/23 pt was having [employer] exam to determine pts ins, premium for next year. Initially BP 168/96 and recked BP 156/94. Pt said she was in no pain and not more anxious than usual. Pt said she does not usually take BP at home. Pt asked me to look at recent visits in other offices for BP readings. 11/14/22 BP 139/88 P 86 at pulmonology appt. Last visit with Dr Para March 02/13/2022 BP 144/80 P81.  Pt takes lisinopril 5 mg at night pt said last 2 - 3 weeks pt may have missed x 2 taking lisinopril 5 mg.pt has not had any CP,SOB,H/A, dizziness, vision changes. Pt said she has had fatigue for some time and has discussed with Dr Para March previously so not a new symptom.no increase in salt intake.pt does not think lisinopril 5 mg is strong enough and request cb after note reviewed by Dr Para March.pt local pharmacy is  [facility] [address], sending note to Dr Para March and  Para March pool.

## 2023-01-30 NOTE — Telephone Encounter (Signed)
Called patient reviewed all information and repeated back to me. Will call if any questions.  She will make changes to medication as advised. Patient will call back later time to set up CPE. She will give call in one week if blood pressures are elevated.

## 2023-01-30 NOTE — Telephone Encounter (Signed)
Left v/m requesting pt to ccb [phone]. Sending note to [facility] triage.

## 2023-02-18 ENCOUNTER — Other Ambulatory Visit: Payer: Self-pay | Admitting: Family Medicine

## 2023-02-18 NOTE — Telephone Encounter (Signed)
LVM for patient to c/b and schedule.  

## 2023-02-18 NOTE — Telephone Encounter (Signed)
Refill request for  metFORMIN HCl 500 MG Oral Tablet   LOV - 02/13/22 Next OV - not scheduled Last refill - 02/13/22 #180/3

## 2023-02-18 NOTE — Telephone Encounter (Signed)
Patient is past due for physical; please call patient to schedule appt.

## 2023-02-26 ENCOUNTER — Telehealth: Payer: Self-pay | Admitting: Family Medicine

## 2023-02-26 DIAGNOSIS — M109 Gout, unspecified: Secondary | ICD-10-CM

## 2023-02-26 DIAGNOSIS — E119 Type 2 diabetes mellitus without complications: Secondary | ICD-10-CM

## 2023-02-26 NOTE — Telephone Encounter (Signed)
Patient called in and scheduled her annual physical. She is wanting to have her labs done at [facility] and would like the order to be sent over to them or put in her MyChart. Thank you

## 2023-02-27 ENCOUNTER — Encounter: Payer: Self-pay | Admitting: Pharmacist

## 2023-02-27 NOTE — Addendum Note (Signed)
Addended by: Joaquim Nam on: 02/27/2023 06:33 PM   Modules accepted: Orders

## 2023-02-27 NOTE — Telephone Encounter (Signed)
Ordered for [facility].  Thanks.

## 2023-03-07 LAB — COMPREHENSIVE METABOLIC PANEL
ALT: 33 IU/L — ABNORMAL HIGH (ref 0–32)
AST: 39 IU/L (ref 0–40)
Albumin: 4.5 g/dL (ref 3.8–4.9)
Alkaline Phosphatase: 82 IU/L (ref 44–121)
BUN/Creatinine Ratio: 20 (ref 12–28)
BUN: 14 mg/dL (ref 8–27)
Bilirubin Total: 0.5 mg/dL (ref 0.0–1.2)
CO2: 23 mmol/L (ref 20–29)
Calcium: 9.8 mg/dL (ref 8.7–10.3)
Chloride: 102 mmol/L (ref 96–106)
Creatinine, Ser: 0.69 mg/dL (ref 0.57–1.00)
Globulin, Total: 2.3 g/dL (ref 1.5–4.5)
Glucose: 128 mg/dL — ABNORMAL HIGH (ref 70–99)
Potassium: 4.6 mmol/L (ref 3.5–5.2)
Sodium: 139 mmol/L (ref 134–144)
Total Protein: 6.8 g/dL (ref 6.0–8.5)
eGFR: 99 mL/min/{1.73_m2} (ref >59)

## 2023-03-07 LAB — LIPID PANEL
Chol/HDL Ratio: 4.2 ratio (ref 0.0–4.4)
Cholesterol, Total: 218 mg/dL — ABNORMAL HIGH (ref 100–199)
HDL: 52 mg/dL (ref >39)
LDL Chol Calc (NIH): 138 mg/dL — ABNORMAL HIGH (ref 0–99)
Triglycerides: 159 mg/dL — ABNORMAL HIGH (ref 0–149)
VLDL Cholesterol Cal: 28 mg/dL (ref 5–40)

## 2023-03-07 LAB — HEMOGLOBIN A1C
Est. average glucose Bld gHb Est-mCnc: 160 mg/dL
Hgb A1c MFr Bld: 7.2 % — ABNORMAL HIGH (ref 4.8–5.6)

## 2023-03-07 LAB — URIC ACID: Uric Acid: 7.9 mg/dL — ABNORMAL HIGH (ref 3.0–7.2)

## 2023-03-07 LAB — TSH: TSH: 1.25 u[IU]/mL (ref 0.450–4.500)

## 2023-03-07 LAB — MICROALBUMIN / CREATININE URINE RATIO
Creatinine, Urine: 167.9 mg/dL
Microalb/Creat Ratio: 9 mg/g creat (ref 0–29)
Microalbumin, Urine: 14.8 ug/mL

## 2023-03-11 ENCOUNTER — Encounter: Payer: Self-pay | Admitting: Family Medicine

## 2023-03-11 ENCOUNTER — Ambulatory Visit (INDEPENDENT_AMBULATORY_CARE_PROVIDER_SITE_OTHER): Payer: Managed Care, Other (non HMO) | Admitting: Family Medicine

## 2023-03-11 VITALS — BP 144/86 | HR 97 | Temp 99.3°F | Ht 65.0 in | Wt 272.0 lb

## 2023-03-11 DIAGNOSIS — Z0000 Encounter for general adult medical examination without abnormal findings: Secondary | ICD-10-CM

## 2023-03-11 DIAGNOSIS — E119 Type 2 diabetes mellitus without complications: Secondary | ICD-10-CM

## 2023-03-11 DIAGNOSIS — G4733 Obstructive sleep apnea (adult) (pediatric): Secondary | ICD-10-CM

## 2023-03-11 DIAGNOSIS — Z7189 Other specified counseling: Secondary | ICD-10-CM

## 2023-03-11 DIAGNOSIS — I10 Essential (primary) hypertension: Secondary | ICD-10-CM

## 2023-03-11 DIAGNOSIS — M109 Gout, unspecified: Secondary | ICD-10-CM

## 2023-03-11 MED ORDER — POTASSIUM CHLORIDE ER 10 MEQ PO TBCR: 10.0000 meq | EXTENDED_RELEASE_TABLET | Freq: Every day | ORAL | 3 refills | Status: DC

## 2023-03-11 MED ORDER — COLCHICINE 0.6 MG PO TABS: ORAL_TABLET | ORAL | 1 refills | Status: DC

## 2023-03-11 MED ORDER — LISINOPRIL 20 MG PO TABS: 20.0000 mg | ORAL_TABLET | Freq: Every day | ORAL | 3 refills | Status: DC

## 2023-03-11 MED ORDER — METFORMIN HCL 500 MG PO TABS: 500.0000 mg | ORAL_TABLET | Freq: Two times a day (BID) | ORAL | 3 refills | Status: DC

## 2023-03-11 MED ORDER — ATORVASTATIN CALCIUM 20 MG PO TABS
20.0000 mg | ORAL_TABLET | Freq: Every day | ORAL | 3 refills | Status: DC
Start: 2023-03-11 — End: 2023-09-16

## 2023-03-11 NOTE — Assessment & Plan Note (Addendum)
Continue metformin. Recheck in about 6 months with A1c at the visit.  Low sugar diet d/w pt.

## 2023-03-11 NOTE — Patient Instructions (Addendum)
I would get a flu shot each fall.   Pneumonia, shingles, covid when possible.    Increase lisinopril to 20mg  and recheck labs in about 2-3 weeks.  Let me know if your BP is still above 140/90.   Add on lipitor 20mg  at night.  If you have aches, then cut back to 10mg .  If you still have aches, then let me know.    Recheck in about 6 months with A1c at the visit.   Take care.   Glad to see you.

## 2023-03-11 NOTE — Progress Notes (Signed)
CPE- See plan.  Routine anticipatory guidance given to patient.  See health maintenance.  The possibility exists that previously documented standard health maintenance information may have been brought forward from a previous encounter into this note.  If needed, that same information has been updated to reflect the current situation based on today's encounter.    Tetanus 2022 Flu done to be done this fall.   PNA d/w pt.   Shingles d/w pt.   covid vaccine 2021 Pap per gyn 2024 Mammogram pending 2024 DXA d/w pt. Defer 2023 Colonoscopy 11/27/21 per GI.   Living will d/w pt. Brother Tamera Punt designated if patient were incapacitated.   Diet and exercise d/w pt.    Diabetes:  Using medications without difficulties: yes Hypoglycemic episodes:no Hyperglycemic episodes:no Feet problems:no Blood Sugars averaging: not checked often.   eye exam within last year: yes Statin indication d/w pt.  Labs d/w pt.   Hypertension:    Using medication without problems or lightheadedness: yes Chest pain with exertion:no Edema:occ mild BLE edema.   Short of breath:no  H/o gout. No recent flares.   OSA on CPAP.  D/w pt about getting referral done.  Ordered.    PMH and SH reviewed  Meds, vitals, and allergies reviewed.   ROS: Per HPI.  Unless specifically indicated otherwise in HPI, the patient denies:  General: fever. Eyes: acute vision changes ENT: sore throat Cardiovascular: chest pain Respiratory: SOB GI: vomiting GU: dysuria Musculoskeletal: acute back pain Derm: acute rash Neuro: acute motor dysfunction Psych: worsening mood Endocrine: polydipsia Heme: bleeding Allergy: hayfever  GEN: nad, alert and oriented HEENT: ncat NECK: supple w/o LA CV: rrr. PULM: ctab, no inc wob ABD: soft, +bs EXT: trace L ankle edema SKIN: no acute rash  Diabetic foot exam: Normal inspection No skin breakdown No calluses  Normal DP pulses Normal sensation to light touch and  monofilament Nails normal

## 2023-03-11 NOTE — Assessment & Plan Note (Signed)
Living will d/w pt. Brother Garry Teel designated if patient were incapacitated.   

## 2023-03-11 NOTE — Assessment & Plan Note (Signed)
OSA on CPAP.  D/w pt about getting referral done.  Ordered.

## 2023-03-11 NOTE — Assessment & Plan Note (Signed)
No recent flares, can update me as needed.

## 2023-03-11 NOTE — Assessment & Plan Note (Signed)
Increase lisinopril to 20mg  and recheck labs in about 2-3 weeks.  She can let me know if BP is still above 140/90.

## 2023-03-11 NOTE — Assessment & Plan Note (Signed)
Tetanus 2022 Flu done to be done this fall.   PNA d/w pt.   Shingles d/w pt.   covid vaccine 2021 Pap per gyn 2024 Mammogram pending 2024 DXA d/w pt. Defer 2023 Colonoscopy 11/27/21 per GI.   Living will d/w pt. Brother Tamera Punt designated if patient were incapacitated.   Diet and exercise d/w pt.

## 2023-04-21 ENCOUNTER — Other Ambulatory Visit: Payer: Self-pay | Admitting: Family Medicine

## 2023-05-12 ENCOUNTER — Telehealth: Payer: Self-pay | Admitting: Family Medicine

## 2023-05-12 NOTE — Telephone Encounter (Signed)
Please check with patient to make sure she can get her f/u mammogram scheduled ~05/2023.  She has order in EMR.  Thanks.

## 2023-05-13 NOTE — Telephone Encounter (Signed)
Please disregard this message.  It has been addressed and she is scheduled for 06/13/23.

## 2023-05-13 NOTE — Telephone Encounter (Signed)
Opened in error

## 2023-06-13 ENCOUNTER — Ambulatory Visit
Admission: RE | Admit: 2023-06-13 | Discharge: 2023-06-13 | Disposition: A | Discharge status: Home / Self Care | Payer: Managed Care, Other (non HMO) | Source: Ambulatory Visit | Attending: Hematology | Admitting: Hematology

## 2023-06-13 DIAGNOSIS — R921 Mammographic calcification found on diagnostic imaging of breast: Secondary | ICD-10-CM

## 2023-07-24 ENCOUNTER — Institutional Professional Consult (permissible substitution): Payer: Managed Care, Other (non HMO) | Admitting: Nurse Practitioner

## 2023-07-29 ENCOUNTER — Ambulatory Visit (INDEPENDENT_AMBULATORY_CARE_PROVIDER_SITE_OTHER): Payer: Managed Care, Other (non HMO) | Admitting: Nurse Practitioner

## 2023-07-29 ENCOUNTER — Encounter: Payer: Self-pay | Admitting: Nurse Practitioner

## 2023-07-29 VITALS — BP 128/72 | HR 106 | Temp 98.4°F | Ht 65.0 in | Wt 283.8 lb

## 2023-07-29 DIAGNOSIS — E66813 Obesity, class 3: Secondary | ICD-10-CM

## 2023-07-29 DIAGNOSIS — G4733 Obstructive sleep apnea (adult) (pediatric): Secondary | ICD-10-CM

## 2023-07-29 DIAGNOSIS — R682 Dry mouth, unspecified: Secondary | ICD-10-CM

## 2023-07-29 NOTE — Patient Instructions (Addendum)
 Continue to use CPAP every night, minimum of 4-6 hours a night.  Change equipment as directed. Wash your tubing with warm soap and water daily, hang to dry. Wash humidifier portion weekly. Use bottled, distilled water and change daily Be aware of reduced alertness and do not drive or operate heavy machinery if experiencing this or drowsiness.  Exercise encouraged, as tolerated. Healthy weight management discussed.  Avoid or decrease alcohol consumption and medications that make you more sleepy, if possible. Notify if persistent daytime sleepiness occurs even with consistent use of PAP therapy.  We discussed how untreated sleep apnea puts an individual at risk for cardiac arrhthymias, pulm HTN, DM, stroke and increases their risk for daytime accidents. We also briefly reviewed treatment options including weight loss, side sleeping position, oral appliance, CPAP therapy or referral to ENT for possible surgical options  I will request the medical supply company add [place] to your online portal so I can review your pressure settings. We may need to adjust these to help with the mouth dryness. I'll send you a message or call you once I get this to let you know what I adjust your settings to.  If they can't get [place] into your online portal, you may have to take the machine up to their office   Use saline nasal gel in each nostril at bedtime before you put your mask on  Adjust your humidity settings and temperature on your CPAP - usually this is under climate settings on the machine If I can get into your portal, I will try to adjust this for you  Let me know how things are going with the changes  Referral to healthy weight management  Follow up in 6 weeks with Katie Ethan Clayburn,NP. If symptoms do not improve or worsen, please contact office for sooner follow up

## 2023-07-29 NOTE — Progress Notes (Signed)
 @Patient  ID: Kim Roberts, female    DOB: 19-Dec-1960, 63 y.o.   MRN: [id number]  Chief Complaint  Patient presents with   Follow-up    Referring provider: Joaquim Nam, MD  HPI: 63 year old female, never smoker referred for sleep consult.  Past medical history significant for hypertension, DM, OSA, gout, history of breast cancer, obesity.  TEST/EVENTS:  12/19/2021 split-night study: AHI 66/h, SpO2 low 79%   08/01/2023: Today - sleep consult Discussed the use of AI scribe software for clinical note transcription with the patient, who gave verbal consent to proceed.  History of Present Illness   Kim Roberts is a 63 year old female with severe sleep apnea who presents for CPAP management and follow-up.  She has been using a CPAP machine since her diagnosis of severe sleep apnea in 2018. She experiences difficulties with consistent use due to mouth dryness and mask discomfort. She has tried various masks and is currently using a full face mask. Despite these challenges, the CPAP improves her breathing at night and reduces nocturia.  Her last sleep study was conducted in 2023, and she has not received a new CPAP machine since then. Her CPAP settings are titrated to a pressure range of 14 to 20 cm H2O. She is unsure if she snores while using the CPAP but believes it helps reduce snoring. Significant mouth dryness is attributed to sleeping with her mouth open, and she has considered using a mouthpiece but has not pursued it due to dental work requirements.  She feels tired during the day but not excessively so, and does not experience daytime sleepiness to the extent of dozing off during conversations. No excessive daytime sleepiness, but significant mouth dryness, especially upon waking.  She is aware that her weight may contribute to her sleep apnea and mentions being overweight as a factor. She thinks her DME company is [employer].     Goes to bed around 10 PM to midnight.   Usually falls asleep quickly.  Wakes 2-3 times a night.  Usually gets up around 5:30 AM.  Does not operate any heavy machinery in her job Animal nutritionist.  No significant weight change over the last 2 years.  Last sleep study was at [facility] in 2023.  Lives by herself.  Never smoker.  Does not drink alcohol.  No excessive caffeine intake.  Works as a Designer, industrial/product.  Family history of cancer.  Epworth 4   Allergies  Allergen Reactions   Latex Rash    Immunization History  Administered Date(s) Administered   DTaP 12/11/1965, 01/23/1966, 02/22/1966, 02/01/1969   IPV 12/11/1965, 01/22/1966, 02/01/1969   Influenza Inj Mdck Quad With Preservative 03/20/2019   Influenza,inj,Quad PF,6+ Mos 03/30/2016, 04/05/2018, 03/24/2020   Influenza-Unspecified 03/11/2014, 03/18/2017, 04/05/2018   MMR 12/20/1997   PFIZER(Purple Top)SARS-COV-2 Vaccination 01/14/2020, 02/10/2020   Tdap 01/11/2011, 01/16/2021    Past Medical History:  Diagnosis Date   Breast cancer (HCC) 2017   Left Breast Cancer   Colon polyp    Diabetes mellitus without complication (HCC)    Gout    Heart murmur    as a child   History of radiation therapy 04/19/16- 06/07/16   Left Breast/ 50.4 Gy in 28 fractions, Left Breast boosted/ 10 Gy in 5 fractions   Hyperlipidemia    Hypertension    Menopause    Personal history of radiation therapy 2017   Left Breast Cancer   Sleep apnea    uses CPAP nightly  Tuberculosis    in childhood, treated.     Tobacco History: Social History   Tobacco Use  Smoking Status Never  Smokeless Tobacco Never   Counseling given: Not Answered   Outpatient Medications Prior to Visit  Medication Sig Dispense Refill   atorvastatin (LIPITOR) 20 MG tablet Take 1 tablet (20 mg total) by mouth daily. 90 tablet 3   colchicine 0.6 MG tablet TAKE 1 TABLET BY MOUTH TWICE  DAILY FOR GOUT AS NEEDED. TAPER  TO 1 TABLET BY MOUTH DAILY AS  TOLERATED 60 tablet 2   lisinopril (ZESTRIL) 20 MG tablet Take 1 tablet (20  mg total) by mouth daily. 90 tablet 3   metFORMIN (GLUCOPHAGE) 500 MG tablet Take 1 tablet (500 mg total) by mouth 2 (two) times daily with a meal. 180 tablet 3   potassium chloride (KLOR-CON) 10 MEQ tablet Take 1 tablet (10 mEq total) by mouth daily. 90 tablet 3   vitamin B-12 (CYANOCOBALAMIN) 1000 MCG tablet Take 1,000 mcg by mouth daily.     No facility-administered medications prior to visit.     Review of Systems:   Constitutional: No weight loss or gain, night sweats, fevers, chills, or lassitude. +occasional fatigue  HEENT: No headaches, difficulty swallowing, tooth/dental problems, or sore throat. No sneezing, itching, ear ache, nasal congestion, or post nasal drip. +mouth dryness  CV:  No chest pain, orthopnea, PND, swelling in lower extremities, anasarca, dizziness, palpitations, syncope Resp: +snoring; witnessed apneas; baseline shortness of breath with exertion. No excess mucus or change in color of mucus. No productive or non-productive. No hemoptysis. No wheezing.  No chest wall deformity GI:  No heartburn, indigestion GU: No dysuria, change in color of urine, urgency or frequency.  Skin: No rash, lesions, ulcerations MSK:  No joint pain or swelling.   Neuro: No dizziness or lightheadedness.  Psych: No depression or anxiety. Mood stable. +sleep disturbance     Physical Exam:  BP 128/72 (BP Location: Right Arm, Patient Position: Sitting, Cuff Size: Large)   Pulse (!) 106   Temp 98.4 F (36.9 C) (Oral)   Ht 5\' 5"  (1.651 m)   Wt 283 lb 12.8 oz (128.7 kg)   SpO2 98%   BMI 47.23 kg/m   GEN: Pleasant, interactive, well-appearing; morbidly obese; in no acute distress HEENT:  Normocephalic and atraumatic. PERRLA. Sclera white. Nasal turbinates pink, moist and patent bilaterally. No rhinorrhea present. Oropharynx pink and moist, without exudate or edema. No lesions, ulcerations, or postnasal drip. Mallampati III NECK:  Supple w/ fair ROM. Thyroid symmetrical with no goiter  or nodules palpated. No lymphadenopathy.   CV: RRR, no m/r/g, no peripheral edema. Pulses intact, +2 bilaterally. No cyanosis, pallor or clubbing. PULMONARY:  Unlabored, regular breathing. Clear bilaterally A&P w/o wheezes/rales/rhonchi. No accessory muscle use.  GI: BS present and normoactive. Soft, non-tender to palpation. No organomegaly or masses detected.  MSK: No erythema, warmth or tenderness. Cap refil <2 sec all extrem. No deformities or joint swelling noted.  Neuro: A/Ox3. No focal deficits noted.   Skin: Warm, no lesions or rashe Psych: Normal affect and behavior. Judgement and thought content appropriate.     Lab Results:  CBC    Component Value Date/Time   WBC 7.6 02/08/2022 0000   WBC 7.2 04/21/2020 1058   RBC 4.22 02/08/2022 0000   RBC 4.33 04/21/2020 1058   HGB 13.0 02/08/2022 0000   HGB 12.8 04/05/2017 0929   HCT 38.7 02/08/2022 0000   HCT 38.4 04/05/2017 0929  PLT 312 02/08/2022 0000   MCV 92 02/08/2022 0000   MCV 93.9 04/05/2017 0929   MCH 30.8 02/08/2022 0000   MCH 30.3 04/21/2020 1058   MCHC 33.6 02/08/2022 0000   MCHC 32.8 04/21/2020 1058   RDW 14.4 02/08/2022 0000   RDW 15.1 (H) 04/05/2017 0929   LYMPHSABS 3.7 02/08/2022 0000   LYMPHSABS 3.2 04/05/2017 0929   MONOABS 0.5 04/21/2020 1058   MONOABS 0.5 04/05/2017 0929   EOSABS 0.1 02/08/2022 0000   BASOSABS 0.0 02/08/2022 0000   BASOSABS 0.0 04/05/2017 0929    BMET    Component Value Date/Time   NA 139 03/06/2023 0931   NA 141 04/05/2017 0929   K 4.6 03/06/2023 0931   K 3.4 (L) 04/05/2017 0929   CL 102 03/06/2023 0931   CL 104 02/14/2013 1411   CO2 23 03/06/2023 0931   CO2 28 04/05/2017 0929   GLUCOSE 128 (H) 03/06/2023 0931   GLUCOSE 118 (H) 10/17/2022 1000   GLUCOSE 114 04/05/2017 0929   BUN 14 03/06/2023 0931   BUN 14.2 04/05/2017 0929   CREATININE 0.69 03/06/2023 0931   CREATININE 0.9 04/05/2017 0929   CALCIUM 9.8 03/06/2023 0931   CALCIUM 9.5 04/05/2017 0929   GFRNONAA >60  10/17/2022 1000   GFRNONAA >60 02/14/2013 1411   GFRAA 93 04/11/2020 1031   GFRAA >60 02/14/2013 1411    BNP No results found for: "BNP"   Imaging:  No results found.  Administration History     None           No data to display          No results found for: "NITRICOXIDE"      Assessment & Plan:   Obstructive sleep apnea Severe OSA on CPAP. Difficulties tolerating. Unable to view download today. Will have to request access to this. Plan to reach out to her prior provider at [facility] if unable to locate DME company. Advised her to check on machine at home. May need to adjust pressure settings. Recommend she try saline nasal gel and adjust her humidity settings at home to see if this helps with mouth dryness, as this is her main issue. She does feel sleep is more restful with CPAP than without. Aware of risks of untreated OSA. Not a good candidate for oral appliance given severity and BMI is >40 so would not be a candidate for Inspire at this time. Healthy weight loss encouraged. Aware of safe driving practices.   Patient Instructions  Continue to use CPAP every night, minimum of 4-6 hours a night.  Change equipment as directed. Wash your tubing with warm soap and water daily, hang to dry. Wash humidifier portion weekly. Use bottled, distilled water and change daily Be aware of reduced alertness and do not drive or operate heavy machinery if experiencing this or drowsiness.  Exercise encouraged, as tolerated. Healthy weight management discussed.  Avoid or decrease alcohol consumption and medications that make you more sleepy, if possible. Notify if persistent daytime sleepiness occurs even with consistent use of PAP therapy.  We discussed how untreated sleep apnea puts an individual at risk for cardiac arrhthymias, pulm HTN, DM, stroke and increases their risk for daytime accidents. We also briefly reviewed treatment options including weight loss, side sleeping position,  oral appliance, CPAP therapy or referral to ENT for possible surgical options  I will request the medical supply company add [place] to your online portal so I can review your pressure settings. We may need  to adjust these to help with the mouth dryness. I'll send you a message or call you once I get this to let you know what I adjust your settings to.  If they can't get [place] into your online portal, you may have to take the machine up to their office   Use saline nasal gel in each nostril at bedtime before you put your mask on  Adjust your humidity settings and temperature on your CPAP - usually this is under climate settings on the machine If I can get into your portal, I will try to adjust this for you  Let me know how things are going with the changes  Referral to healthy weight management  Follow up in 6 weeks with Katie Taryll Reichenberger,NP. If symptoms do not improve or worsen, please contact office for sooner follow up   Obesity BMI 47. Referral to medical weight management  Dry mouth See above   Advised if symptoms do not improve or worsen, to please contact office for sooner follow up or seek emergency care.   I spent 45 minutes of dedicated to the care of this patient on the date of this encounter to include pre-visit review of records, face-to-face time with the patient discussing conditions above, post visit ordering of testing, clinical documentation with the electronic health record, making appropriate referrals as documented, and communicating necessary findings to members of the patients care team.  Noemi Chapel, NP 08/01/2023  Pt aware and understands NP's role.

## 2023-08-01 DIAGNOSIS — R682 Dry mouth, unspecified: Secondary | ICD-10-CM | POA: Insufficient documentation

## 2023-08-01 NOTE — Assessment & Plan Note (Signed)
 BMI 47. Referral to medical weight management

## 2023-08-01 NOTE — Assessment & Plan Note (Signed)
 Severe OSA on CPAP. Difficulties tolerating. Unable to view download today. Will have to request access to this. Plan to reach out to her prior provider at [facility] if unable to locate DME company. Advised her to check on machine at home. May need to adjust pressure settings. Recommend she try saline nasal gel and adjust her humidity settings at home to see if this helps with mouth dryness, as this is her main issue. She does feel sleep is more restful with CPAP than without. Aware of risks of untreated OSA. Not a good candidate for oral appliance given severity and BMI is >40 so would not be a candidate for Inspire at this time. Healthy weight loss encouraged. Aware of safe driving practices.   Patient Instructions  Continue to use CPAP every night, minimum of 4-6 hours a night.  Change equipment as directed. Wash your tubing with warm soap and water daily, hang to dry. Wash humidifier portion weekly. Use bottled, distilled water and change daily Be aware of reduced alertness and do not drive or operate heavy machinery if experiencing this or drowsiness.  Exercise encouraged, as tolerated. Healthy weight management discussed.  Avoid or decrease alcohol consumption and medications that make you more sleepy, if possible. Notify if persistent daytime sleepiness occurs even with consistent use of PAP therapy.  We discussed how untreated sleep apnea puts an individual at risk for cardiac arrhthymias, pulm HTN, DM, stroke and increases their risk for daytime accidents. We also briefly reviewed treatment options including weight loss, side sleeping position, oral appliance, CPAP therapy or referral to ENT for possible surgical options  I will request the medical supply company add [place] to your online portal so I can review your pressure settings. We may need to adjust these to help with the mouth dryness. I'll send you a message or call you once I get this to let you know what I adjust your settings to.  If they  can't get [place] into your online portal, you may have to take the machine up to their office   Use saline nasal gel in each nostril at bedtime before you put your mask on  Adjust your humidity settings and temperature on your CPAP - usually this is under climate settings on the machine If I can get into your portal, I will try to adjust this for you  Let me know how things are going with the changes  Referral to healthy weight management  Follow up in 6 weeks with Katie Alford Gamero,NP. If symptoms do not improve or worsen, please contact office for sooner follow up

## 2023-08-01 NOTE — Assessment & Plan Note (Signed)
 See above

## 2023-08-15 ENCOUNTER — Encounter: Payer: Self-pay | Admitting: *Deleted

## 2023-09-04 ENCOUNTER — Encounter: Payer: Self-pay | Admitting: Nurse Practitioner

## 2023-09-04 ENCOUNTER — Encounter: Payer: Managed Care, Other (non HMO) | Admitting: Nurse Practitioner

## 2023-09-11 ENCOUNTER — Encounter (INDEPENDENT_AMBULATORY_CARE_PROVIDER_SITE_OTHER): Payer: Self-pay | Admitting: Family Medicine

## 2023-09-13 ENCOUNTER — Ambulatory Visit: Payer: Managed Care, Other (non HMO) | Admitting: Family Medicine

## 2023-09-16 ENCOUNTER — Other Ambulatory Visit: Payer: Self-pay

## 2023-09-16 ENCOUNTER — Ambulatory Visit: Admitting: Family Medicine

## 2023-09-16 ENCOUNTER — Encounter: Payer: Self-pay | Admitting: Family Medicine

## 2023-09-16 VITALS — BP 139/82 | HR 102 | Ht 65.0 in | Wt 278.0 lb

## 2023-09-16 DIAGNOSIS — E66813 Obesity, class 3: Secondary | ICD-10-CM | POA: Diagnosis not present

## 2023-09-16 DIAGNOSIS — I10 Essential (primary) hypertension: Secondary | ICD-10-CM | POA: Diagnosis not present

## 2023-09-16 DIAGNOSIS — Z7984 Long term (current) use of oral hypoglycemic drugs: Secondary | ICD-10-CM

## 2023-09-16 DIAGNOSIS — E119 Type 2 diabetes mellitus without complications: Secondary | ICD-10-CM

## 2023-09-16 DIAGNOSIS — Z6841 Body Mass Index (BMI) 40.0 and over, adult: Secondary | ICD-10-CM | POA: Diagnosis not present

## 2023-09-16 MED ORDER — ATORVASTATIN CALCIUM 20 MG PO TABS: 20.0000 mg | ORAL_TABLET | Freq: Every day | ORAL | 0 refills | Status: DC

## 2023-09-16 NOTE — Progress Notes (Signed)
 Office: [phone]  /  Fax: [phone]   Initial Visit  Kim Roberts was seen in clinic today to evaluate for obesity. She is interested in losing weight to improve overall health and reduce the risk of weight related complications. She presents today to review program treatment options, initial physical assessment, and evaluation.     She was referred by: PCP  When asked what else they would like to accomplish? She states: Improve energy levels and physical activity, Improve existing medical conditions, Improve quality of life, and Improve appearance  Weight history: started to see weight gain >20 years ago.  She was a healthy weight in college.  Lost mom last year and has gained weight.  She has no children.  Her job involves some movement as a Designer, industrial/product (works days).  She lives alone.  She would like to be a size 14.  When asked how has your weight affected you? She states: Contributed to medical problems and Having fatigue  Some associated conditions: Hypertension, OSA, Diabetes, and Other: gout  Contributing factors: Family history of obesity, Reduced physical activity, and Menopause  Weight promoting medications identified: None  Current nutrition plan: None  Current level of physical activity: NEAT  Current or previous pharmacotherapy: None and Metformin  Response to medication: Never tried medications   Past medical history includes:   Past Medical History:  Diagnosis Date   Breast cancer (HCC) 2017   Left Breast Cancer   Colon polyp    Diabetes mellitus without complication (HCC)    Gout    Heart murmur    as a child   History of radiation therapy 04/19/16- 06/07/16   Left Breast/ 50.4 Gy in 28 fractions, Left Breast boosted/ 10 Gy in 5 fractions   Hyperlipidemia    Hypertension    Menopause    Personal history of radiation therapy 2017   Left Breast Cancer   Sleep apnea    uses CPAP nightly   Tuberculosis    in childhood, treated.       Objective:   BP 139/82   Pulse (!) 102   Ht 5\' 5"  (1.651 m)   Wt 278 lb (126.1 kg)   SpO2 98%   BMI 46.26 kg/m  She was weighed on the bioimpedance scale: Body mass index is 46.26 kg/m.  Peak Weight:278 lb , Body Fat%:50.2, Visceral Fat Rating:18, Weight trend over the last 12 months: Increasing  General:  Alert, oriented and cooperative. Patient is in no acute distress.  Respiratory: Normal respiratory effort, no problems with respiration noted   Gait: able to ambulate independently  Mental Status: Normal mood and affect. Normal behavior. Normal judgment and thought content.   DIAGNOSTIC DATA REVIEWED:  BMET    Component Value Date/Time   NA 139 03/06/2023 0931   NA 141 04/05/2017 0929   K 4.6 03/06/2023 0931   K 3.4 (L) 04/05/2017 0929   CL 102 03/06/2023 0931   CL 104 02/14/2013 1411   CO2 23 03/06/2023 0931   CO2 28 04/05/2017 0929   GLUCOSE 128 (H) 03/06/2023 0931   GLUCOSE 118 (H) 10/17/2022 1000   GLUCOSE 114 04/05/2017 0929   BUN 14 03/06/2023 0931   BUN 14.2 04/05/2017 0929   CREATININE 0.69 03/06/2023 0931   CREATININE 0.9 04/05/2017 0929   CALCIUM 9.8 03/06/2023 0931   CALCIUM 9.5 04/05/2017 0929   GFRNONAA >60 10/17/2022 1000   GFRNONAA >60 02/14/2013 1411   GFRAA 93 04/11/2020 1031   GFRAA >60  02/14/2013 1411   Lab Results  Component Value Date   HGBA1C 7.2 (H) 03/06/2023   HGBA1C 6.8 (H) 04/22/2017   No results found for: "INSULIN" CBC    Component Value Date/Time   WBC 7.6 02/08/2022 0000   WBC 7.2 04/21/2020 1058   RBC 4.22 02/08/2022 0000   RBC 4.33 04/21/2020 1058   HGB 13.0 02/08/2022 0000   HGB 12.8 04/05/2017 0929   HCT 38.7 02/08/2022 0000   HCT 38.4 04/05/2017 0929   PLT 312 02/08/2022 0000   MCV 92 02/08/2022 0000   MCV 93.9 04/05/2017 0929   MCH 30.8 02/08/2022 0000   MCH 30.3 04/21/2020 1058   MCHC 33.6 02/08/2022 0000   MCHC 32.8 04/21/2020 1058   RDW 14.4 02/08/2022 0000   RDW 15.1 (H) 04/05/2017 0929    Iron/TIBC/Ferritin/ %Sat No results found for: "IRON", "TIBC", "FERRITIN", "IRONPCTSAT" Lipid Panel     Component Value Date/Time   CHOL 218 (H) 03/06/2023 0931   CHOL 209 (H) 02/14/2013 1411   TRIG 159 (H) 03/06/2023 0931   TRIG 295 (H) 02/14/2013 1411   HDL 52 03/06/2023 0931   HDL 47 02/14/2013 1411   CHOLHDL 4.2 03/06/2023 0931   CHOLHDL 4 04/22/2017 1004   VLDL 32.8 04/22/2017 1004   VLDL 59 (H) 02/14/2013 1411   LDLCALC 138 (H) 03/06/2023 0931   LDLCALC 103 (H) 02/14/2013 1411   Hepatic Function Panel     Component Value Date/Time   PROT 6.8 03/06/2023 0931   PROT 7.1 04/05/2017 0929   ALBUMIN 4.5 03/06/2023 0931   ALBUMIN 3.7 04/05/2017 0929   AST 39 03/06/2023 0931   AST 47 (H) 04/05/2017 0929   ALT 33 (H) 03/06/2023 0931   ALT 61 (H) 04/05/2017 0929   ALKPHOS 82 03/06/2023 0931   ALKPHOS 72 04/05/2017 0929   BILITOT 0.5 03/06/2023 0931   BILITOT 0.62 04/05/2017 0929      Component Value Date/Time   TSH 1.250 03/06/2023 0931     Assessment and Plan:   Diabetes mellitus without complication (HCC) Lab Results  Component Value Date   HGBA1C 7.2 (H) 03/06/2023  She is on metformin 500 mg bid for T2DM, tolerating well Anticipate improvements in type II DM with lifestyle changes and weight reduction Consider addition of Ozempic or Mounjaro  Class 3 severe obesity due to excess calories with serious comorbidity and body mass index (BMI) of 45.0 to 49.9 in adult [facility])  Essential hypertension, benign On lisinopril 20 mg daily.  BP is not at goal Look for HTN improvements with healthy lifestyle changes and weight reduction       Obesity Treatment / Action Plan:  Patient will work on garnering support from family and friends to begin weight loss journey. Will work on eliminating or reducing the presence of highly palatable, calorie dense foods in the home. Will complete provided nutritional and psychosocial assessment questionnaire before the next  appointment. Will be scheduled for indirect calorimetry to determine resting energy expenditure in a fasting state.  This will allow [place] to create a reduced calorie, high-protein meal plan to promote loss of fat mass while preserving muscle mass. Will think about ideas on how to incorporate physical activity into their daily routine. Counseled on the health benefits of losing 5%-15% of total body weight. Was counseled on nutritional approaches to weight loss and benefits of reducing processed foods and consuming plant-based foods and high quality protein as part of nutritional weight management. Was counseled on pharmacotherapy  and role as an adjunct in weight management.   Obesity Education Performed Today:  She was weighed on the bioimpedance scale and results were discussed and documented in the synopsis.  We discussed obesity as a disease and the importance of a more detailed evaluation of all the factors contributing to the disease.  We discussed the importance of long term lifestyle changes which include nutrition, exercise and behavioral modifications as well as the importance of customizing this to her specific health and social needs.  We discussed the benefits of reaching a healthier weight to alleviate the symptoms of existing conditions and reduce the risks of the biomechanical, metabolic and psychological effects of obesity.  Shamell Hittle [place] appears to be in the action stage of change and states they are ready to start intensive lifestyle modifications and behavioral modifications.  23 minutes was spent today on this visit including the above counseling, pre-visit chart review, and post-visit documentation.  Reviewed by clinician on day of visit: allergies, medications, problem list, medical history, surgical history, family history, social history, and previous encounter notes pertinent to obesity diagnosis.    Seymour Bars, D.O. DABFM, [facility] [address] [place], [place] [address] [phone]

## 2023-09-20 ENCOUNTER — Telehealth: Payer: Managed Care, Other (non HMO) | Admitting: Nurse Practitioner

## 2023-10-21 DIAGNOSIS — Z0289 Encounter for other administrative examinations: Secondary | ICD-10-CM

## 2023-10-31 ENCOUNTER — Ambulatory Visit: Admitting: Bariatrics

## 2023-10-31 ENCOUNTER — Encounter: Payer: Self-pay | Admitting: Bariatrics

## 2023-10-31 VITALS — BP 133/82 | HR 87 | Temp 98.0°F | Ht 65.0 in | Wt 279.0 lb

## 2023-10-31 DIAGNOSIS — E559 Vitamin D deficiency, unspecified: Secondary | ICD-10-CM

## 2023-10-31 DIAGNOSIS — Z1331 Encounter for screening for depression: Secondary | ICD-10-CM

## 2023-10-31 DIAGNOSIS — E119 Type 2 diabetes mellitus without complications: Secondary | ICD-10-CM | POA: Diagnosis not present

## 2023-10-31 DIAGNOSIS — Z7984 Long term (current) use of oral hypoglycemic drugs: Secondary | ICD-10-CM

## 2023-10-31 DIAGNOSIS — R0602 Shortness of breath: Secondary | ICD-10-CM | POA: Diagnosis not present

## 2023-10-31 DIAGNOSIS — G4733 Obstructive sleep apnea (adult) (pediatric): Secondary | ICD-10-CM

## 2023-10-31 DIAGNOSIS — Z6841 Body Mass Index (BMI) 40.0 and over, adult: Secondary | ICD-10-CM

## 2023-10-31 DIAGNOSIS — Z0000 Encounter for general adult medical examination without abnormal findings: Secondary | ICD-10-CM

## 2023-10-31 DIAGNOSIS — E66813 Obesity, class 3: Secondary | ICD-10-CM

## 2023-10-31 DIAGNOSIS — R5383 Other fatigue: Secondary | ICD-10-CM | POA: Diagnosis not present

## 2023-10-31 NOTE — Progress Notes (Signed)
 At a Glance:  Vitals Temp: 98 F (36.7 C) BP: 133/82 Pulse Rate: 87 SpO2: 96 %   Anthropometric Measurements Height: 5\' 5"  (1.651 m) Weight: 279 lb (126.6 kg) BMI (Calculated): 46.43 Starting Weight: 279lb Peak Weight: 278lb   Body Composition  Body Fat %: 52.2 % Fat Mass (lbs): 145.6 lbs Muscle Mass (lbs): 126.6 lbs Total Body Water (lbs): 105 lbs Visceral Fat Rating : 19   Other Clinical Data RMR: 2074 Fasting: yes Labs: yes Today's Visit #: 1 Starting Date: 10/31/23    EKG: Normal sinus rhythm, rate 95.  Indirect Calorimeter:   Resting Metabolic Rate ( RMR):  RMR (actual): 2074 kcal RMR (calculated): 1923 kcal The calculated basal metabolic rate is 1610 kcal thus her basal metabolic rate is better than expected.  Plan:   Indirect calorimeter completed, interpreted and reviewed with patient today and allowed to ask questions.  Discussed the implications for the chosen plan and exercise based on the RMR reading.  Will consider repeating the RMR in the future based on weight loss.    Chief Complaint:  Obesity   Subjective:  Kim Roberts (MR# [id number]) is a 63 y.o. female who presents for evaluation and treatment of obesity and related comorbidities.   Kim Roberts is currently in the action stage of change and ready to dedicate time achieving and maintaining a healthier weight. Kim Roberts is interested in becoming our patient and working on intensive lifestyle modifications including (but not limited to) diet and exercise for weight loss.  Kim Roberts has been struggling with her weight. She has been unsuccessful in either losing weight, maintaining weight loss, or reaching her healthy weight goal.  Kim Roberts's habits were reviewed today and are as follows: she thinks her family will eat healthier with her, she started gaining weight in her forties , she has significant food cravings issues, and she snacks frequently in the evenings.  Current or previous  pharmacotherapy: Other: diet pills in college  Response to medication: Ineffective so it was discontinued  Other Fatigue Kim Roberts admits to daytime somnolence and admits to waking up still tired. Patient has a history of symptoms of morning fatigue, but not always. Kim Roberts generally gets 5 or 6 hours of sleep per night, and states that she has difficulty falling asleep. Snoring is present. Apneic episodes are present. Epworth Sleepiness Score is 10.   Shortness of Breath Kim Roberts notes increasing shortness of breath with exercising and seems to be worsening over time with weight gain. She notes getting out of breath sooner with activity than she used to. This has gotten worse recently. Kim Roberts denies shortness of breath at rest or orthopnea.  Depression Screen Kim Roberts Food and Mood (modified PHQ-9) score was 8. 5-9 mild depression     03/11/2023    2:06 PM  Depression screen PHQ 2/9  Decreased Interest 0  Down, Depressed, Hopeless 0  PHQ - 2 Score 0  Altered sleeping 0  Tired, decreased energy 1  Change in appetite 0  Feeling bad or failure about yourself  0  Trouble concentrating 0  Moving slowly or fidgety/restless 0  Suicidal thoughts 0  PHQ-9 Score 1  Difficult doing work/chores Somewhat difficult     Assessment and Plan:   Other Fatigue Kim Roberts does feel that her weight is causing her energy to be lower than it should be. Fatigue may be related to obesity, depression or many other causes. Labs will be ordered, and in the meanwhile, Kim Roberts will focus on self care including making  healthy food choices, increasing physical activity and focusing on stress reduction.  Shortness of Breath Kim Roberts does feel that she gets out of breath more easily that she used to when she exercises. Kim Roberts's shortness of breath appears to be obesity related and exercise induced. She has agreed to work on weight loss and gradually increase exercise to treat her exercise induced shortness of breath. Will  continue to monitor closely.  Health Maintenance:   Obesity   Plan: Will do EKG, indirect calorimetry, and labs.     Vitamin D Deficiency;  She is at risk for vitamin D deficiency due to obesity.  She is not on any vitamins or supplements.   Plan: Will check for vitamin D deficiency.   Kim Roberts had a positive depression screening. Depression is commonly associated with obesity and often results in emotional eating behaviors. We will monitor this closely and work on CBT to help improve the non-hunger eating patterns. Referral to Psychology may be required if no improvement is seen as she continues in our clinic.   Type II Diabetes HgbA1c is not at goal. Last A1c was 7.2 Episodes of hypoglycemia: no Medication(s): Metformin  500 mg twice daily with meals  Lab Results  Component Value Date   HGBA1C 7.2 (H) 03/06/2023   HGBA1C 7.0 (H) 10/04/2022   HGBA1C 7.1 (H) 02/08/2022   Lab Results  Component Value Date   MICROALBUR <0.7 01/10/2018   LDLCALC 138 (H) 03/06/2023   CREATININE 0.69 03/06/2023   Lab Results  Component Value Date   GFR 97.43 04/22/2017   GFR 76.33 01/30/2016   GFR 96.90 12/15/2014    Plan: Continue Metformin  500 mg twice daily with meals Will keep all carbohydrates low both sweets and starches.  Will continue exercise regimen to 30 to 60 minutes on most days of the week.  Aim for 7 to 9 hours of sleep nightly.  Eat more low glycemic index foods.   Obstructive Sleep Apnea Kim Roberts has a diagnosis of sleep apnea. She reports that she is using a CPAP intermittently. Reports restful sleep.   Plan: Continue CPAP therapy. Continue to practice good sleep hygiene.  Will add in elements of an antiinflammatory diet and  add in elements of a Mediterranean diet.  Reduce intake of carbohydrates for weight loss.      Previous labCMP, Lipids, Insulin, HgbA1c, Vit D, Thyroid  Panel, and Microalbumins reviewed today. Date: 03/06/23 CMP, Lipids, HgbA1c, and  TSH  Labs done today (CMP, Lipids, HgbA1c, insulin, vitamin D, microalbumin,  and thyroid  panel).     Morbid Obesity: BMI (Calculated): 46.43   Kim Roberts is currently in the action stage of change and her goal is to begin weight loss efforts. I recommend Kim Roberts begin the structured treatment plan as follows:  She has agreed to Category 3 Plan  Exercise goals: All adults should avoid inactivity. Some activity is better than none, and adults who participate in any amount of physical activity, gain some health benefits.  Behavioral modification strategies:increasing lean protein intake, increase H2O intake, increase high fiber foods, no skipping meals, meal planning and cooking strategies, keeping healthy foods in the home, avoiding temptations, and planning for success  She was informed of the importance of frequent follow-up visits to maximize her success with intensive lifestyle modifications for her multiple health conditions. She was informed we would discuss her lab results at her next visit unless there is a critical issue that needs to be addressed sooner. Kim Roberts agreed to keep her next visit at the  agreed upon time to discuss these results.  Objective:  General: Cooperative, alert, well developed, in no acute distress. HEENT: Conjunctivae and lids unremarkable. Cardiovascular: Regular rhythm.  Lungs: Normal work of breathing. Neurologic: No focal deficits.   Lab Results  Component Value Date   CREATININE 0.69 03/06/2023   BUN 14 03/06/2023   NA 139 03/06/2023   K 4.6 03/06/2023   CL 102 03/06/2023   CO2 23 03/06/2023   Lab Results  Component Value Date   ALT 33 (H) 03/06/2023   AST 39 03/06/2023   ALKPHOS 82 03/06/2023   BILITOT 0.5 03/06/2023   Lab Results  Component Value Date   HGBA1C 7.2 (H) 03/06/2023   HGBA1C 7.0 (H) 10/04/2022   HGBA1C 7.1 (H) 02/08/2022   HGBA1C 7.2 (H) 01/16/2021   HGBA1C 7.0 (H) 04/11/2020   No results found for: "INSULIN" Lab Results   Component Value Date   TSH 1.250 03/06/2023   Lab Results  Component Value Date   CHOL 218 (H) 03/06/2023   HDL 52 03/06/2023   LDLCALC 138 (H) 03/06/2023   TRIG 159 (H) 03/06/2023   CHOLHDL 4.2 03/06/2023   Lab Results  Component Value Date   WBC 7.6 02/08/2022   HGB 13.0 02/08/2022   HCT 38.7 02/08/2022   MCV 92 02/08/2022   PLT 312 02/08/2022   No results found for: "IRON", "TIBC", "FERRITIN"  Attestation Statements:  Applicable history such as the following:  allergies, medications, problem list, medical history, surgical history, family history, social history, and previous encounter notes reviewed by clinician on day of visit:  Time spent on visit in care of the patient today including the items listed below was 44 minutes.    20 minutes were spent talking about the history, 20 minutes for face to face counseling implementing the plan, discussing the specifics of how to arrange meals, meal planning, water intake.   I spent face to face time discussing his/her plan, including breakfast, additional breakfast options, lunch, and dinner options, grocery list, and snacks.  I reviewed her indirect calorimetry. I discussed the implications for the diet plan.    Discussed the bio-impedence test (fat %, muscle mass, and water weight) and allowed the patient to ask questions.   Discussed the following information sheets: Category 3, Grocery List, 100 Calorie Snacks, 200 Calorie Snacks, and Protein shake sheet. .   I reviewed the labs which were ordered from her visit on 03/06/2023,   I additionally spent time documenting, reviewing, and checking the codes before submitting.   This may have been prepared with the assistance of Engineer, civil (consulting).  Occasional wrong-word or sound-a-like substitutions may have occurred due to the inherent limitations of voice recognition software.    Kirk Peper, DO

## 2023-11-01 LAB — COMPREHENSIVE METABOLIC PANEL WITH GFR
ALT: 22 IU/L (ref 0–32)
AST: 29 IU/L (ref 0–40)
Albumin: 4.5 g/dL (ref 3.8–4.9)
Alkaline Phosphatase: 91 IU/L (ref 44–121)
BUN/Creatinine Ratio: 21 (ref 12–28)
BUN: 15 mg/dL (ref 8–27)
Bilirubin Total: 0.6 mg/dL (ref 0.0–1.2)
CO2: 24 mmol/L (ref 20–29)
Calcium: 9.7 mg/dL (ref 8.7–10.3)
Chloride: 102 mmol/L (ref 96–106)
Creatinine, Ser: 0.73 mg/dL (ref 0.57–1.00)
Globulin, Total: 2 g/dL (ref 1.5–4.5)
Glucose: 126 mg/dL — ABNORMAL HIGH (ref 70–99)
Potassium: 4.6 mmol/L (ref 3.5–5.2)
Sodium: 141 mmol/L (ref 134–144)
Total Protein: 6.5 g/dL (ref 6.0–8.5)
eGFR: 94 mL/min/{1.73_m2} (ref >59)

## 2023-11-01 LAB — MICROALBUMIN / CREATININE URINE RATIO
Creatinine, Urine: 239.1 mg/dL
Microalb/Creat Ratio: 6 mg/g{creat} (ref 0–29)
Microalbumin, Urine: 13.2 ug/mL

## 2023-11-01 LAB — LIPID PANEL WITH LDL/HDL RATIO
Cholesterol, Total: 132 mg/dL (ref 100–199)
HDL: 47 mg/dL (ref >39)
LDL Chol Calc (NIH): 65 mg/dL (ref 0–99)
LDL/HDL Ratio: 1.4 ratio (ref 0.0–3.2)
Triglycerides: 108 mg/dL (ref 0–149)
VLDL Cholesterol Cal: 20 mg/dL (ref 5–40)

## 2023-11-01 LAB — HEMOGLOBIN A1C
Est. average glucose Bld gHb Est-mCnc: 177 mg/dL
Hgb A1c MFr Bld: 7.8 % — ABNORMAL HIGH (ref 4.8–5.6)

## 2023-11-01 LAB — TSH+T4F+T3FREE
Free T4: 1.2 ng/dL (ref 0.82–1.77)
T3, Free: 3.4 pg/mL (ref 2.0–4.4)
TSH: 1.62 u[IU]/mL (ref 0.450–4.500)

## 2023-11-01 LAB — INSULIN, RANDOM: INSULIN: 40.3 u[IU]/mL — ABNORMAL HIGH (ref 2.6–24.9)

## 2023-11-01 LAB — VITAMIN D 25 HYDROXY (VIT D DEFICIENCY, FRACTURES): Vit D, 25-Hydroxy: 24.1 ng/mL — ABNORMAL LOW (ref 30.0–100.0)

## 2023-11-06 ENCOUNTER — Encounter: Payer: Self-pay | Admitting: Bariatrics

## 2023-11-06 DIAGNOSIS — E1169 Type 2 diabetes mellitus with other specified complication: Secondary | ICD-10-CM | POA: Insufficient documentation

## 2023-11-06 DIAGNOSIS — E559 Vitamin D deficiency, unspecified: Secondary | ICD-10-CM | POA: Insufficient documentation

## 2023-11-14 ENCOUNTER — Ambulatory Visit: Admitting: Family Medicine

## 2023-11-17 ENCOUNTER — Other Ambulatory Visit: Payer: Self-pay | Admitting: Family Medicine

## 2023-12-04 ENCOUNTER — Ambulatory Visit: Admitting: Bariatrics

## 2023-12-04 ENCOUNTER — Encounter: Payer: Self-pay | Admitting: Bariatrics

## 2023-12-04 VITALS — BP 140/88 | HR 98 | Temp 97.9°F | Ht 65.0 in | Wt 275.0 lb

## 2023-12-04 DIAGNOSIS — Z6841 Body Mass Index (BMI) 40.0 and over, adult: Secondary | ICD-10-CM | POA: Diagnosis not present

## 2023-12-04 DIAGNOSIS — E66813 Obesity, class 3: Secondary | ICD-10-CM

## 2023-12-04 DIAGNOSIS — E119 Type 2 diabetes mellitus without complications: Secondary | ICD-10-CM

## 2023-12-04 DIAGNOSIS — Z7984 Long term (current) use of oral hypoglycemic drugs: Secondary | ICD-10-CM

## 2023-12-04 DIAGNOSIS — E559 Vitamin D deficiency, unspecified: Secondary | ICD-10-CM | POA: Diagnosis not present

## 2023-12-04 MED ORDER — TIRZEPATIDE 2.5 MG/0.5ML ~~LOC~~ SOAJ
2.5000 mg | SUBCUTANEOUS | 0 refills | Status: DC
Start: 2023-12-04 — End: 2024-01-09

## 2023-12-04 MED ORDER — VITAMIN D (ERGOCALCIFEROL) 1.25 MG (50000 UNIT) PO CAPS
50000.0000 [IU] | ORAL_CAPSULE | ORAL | 0 refills | Status: DC
Start: 2023-12-04 — End: 2023-12-23

## 2023-12-04 MED ORDER — ONDANSETRON HCL 4 MG PO TABS: 4.0000 mg | ORAL_TABLET | Freq: Three times a day (TID) | ORAL | 0 refills | Status: DC | PRN

## 2023-12-04 NOTE — Progress Notes (Signed)
 First follow-up after initial visit.        WEIGHT SUMMARY AND BIOMETRICS  Weight Lost Since Last Visit: 4lb  Weight Gained Since Last Visit: 0   Vitals Temp: 97.9 F (36.6 C) BP: (!) 140/88 Pulse Rate: 98 SpO2: 98 %   Anthropometric Measurements Height: 5' 5 (1.651 m) Weight: 275 lb (124.7 kg) BMI (Calculated): 45.76 Weight at Last Visit: 279lb Weight Lost Since Last Visit: 4lb Weight Gained Since Last Visit: 0 Starting Weight: 279lb Total Weight Loss (lbs): 4 lb (1.814 kg) Peak Weight: 279lb   Body Composition  Body Fat %: 50.6 % Fat Mass (lbs): 139.2 lbs Muscle Mass (lbs): 129.2 lbs Total Body Water (lbs): 102 lbs Visceral Fat Rating : 18   Other Clinical Data Fasting: yes Labs: no Today's Visit #: 2 Starting Date: 10/31/23    OBESITY Kim Roberts is here to discuss her progress with her obesity treatment plan along with follow-up of her obesity related diagnoses.    Nutrition Plan: the Category 3 plan - 35% adherence.  Current exercise: yard work  Interim History:  She is down 4 lbs since her last visit. She saw Dr. Waylan on 09/16/23. She is hard to change her eating habits. She is cutting back on her carbohydrates Not eating all of the food on the plan., Protein intake is as prescribed, Is not skipping meals, Water intake is adequate., and Reports excessive cravings.  Initial positives regarding the dietary plan:  Initial challenges regarding  the dietary plan:   Pharmacotherapy: Kim Roberts is on Metformin  500 mg twice daily with meals Adverse side effects: None Hunger is moderately controlled.  Cravings are moderately controlled.  Assessment/Plan:   Vitamin D  Deficiency Vitamin D  is not at goal of 50.  Most recent vitamin D  level was 24.1. She is not on vitamin D   Lab Results  Component Value Date   VD25OH 24.1 (L) 10/31/2023     Plan: Begin prescription vitamin D  50,000 IU weekly.   Type II Diabetes HgbA1c is not at goal. Last A1c was 7.8 CBGs: Not checking      Episodes of hypoglycemia: no She denies any absolute or relative contraindications to GLP-1's. Medication(s): Metformin  500 mg twice daily with meals  Lab Results  Component Value Date   HGBA1C 7.8 (H) 10/31/2023   HGBA1C 7.2 (H) 03/06/2023   HGBA1C 7.0 (H) 10/04/2022   Lab Results  Component Value Date   MICROALBUR <0.7 01/10/2018   LDLCALC 65 10/31/2023   CREATININE 0.73 10/31/2023   Lab Results  Component Value Date   GFR 97.43 04/22/2017   GFR 76.33 01/30/2016   GFR 96.90 12/15/2014    Plan: Start Mounjaro 2.5 mg SQ weekly, 1 month supply with no refills. Also given Rx for Zofran  4 mg every 8 hours as needed as needed.  Demonstrated how to use the injectable pen in the room and allowed patient to simulate giving herself an injection.  Discussed sites for injections.  Discussed side effects risk and benefits. Continue all other medications.  Will keep all carbohydrates low both sweets and starches.  Will continue exercise regimen to 30 to 60 minutes on most days of the week.  Aim for 7 to 9 hours of sleep nightly.  Eat more low glycemic index foods.  She will pack her lunch on a routine basis. Discussed Smart fruit choices Also given new options for dinner and lunch and explained.   Morbid Obesity: Current BMI BMI (Calculated): 45.76   Pharmacotherapy Plan Start  Mounjaro 2.5 mg SQ weekly  Kim Roberts is currently in the action stage of change. As such, her goal is to continue with weight loss efforts.  She has agreed to the Category 3 plan.  Exercise goals: All adults should avoid inactivity. Some physical activity is better than none, and adults who participate in any amount of physical activity gain some health benefits.  Behavioral modification strategies: increasing lean protein intake, no meal skipping, meal planning ,  increase water intake, better snacking choices, increasing lower sugar fruits, increasing fiber rich foods, decrease snacking , avoiding temptations, decrease ETOH, and keep healthy foods in the home.  Kim Roberts has agreed to follow-up with our clinic in 2 weeks.   Labs reviewed today from last visit (CMP, Lipids, HgbA1c, insulin , vitamin D , microalbumin, and thyroid  panel).   Objective:   VITALS: Per patient if applicable, see vitals. GENERAL: Alert and in no acute distress. CARDIOPULMONARY: No increased WOB. Speaking in clear sentences.  PSYCH: Pleasant and cooperative. Speech normal rate and rhythm. Affect is appropriate. Insight and judgement are appropriate. Attention is focused, linear, and appropriate.  NEURO: Oriented as arrived to appointment on time with no prompting.   Attestation Statements:    This was prepared with the assistance of Engineer, civil (consulting).  Occasional wrong-word or sound-a-like substitutions may have occurred due to the inherent limitations of voice recognition software.   Clayborne Daring, DO

## 2023-12-23 ENCOUNTER — Encounter: Payer: Self-pay | Admitting: Bariatrics

## 2023-12-23 ENCOUNTER — Ambulatory Visit (INDEPENDENT_AMBULATORY_CARE_PROVIDER_SITE_OTHER): Admitting: Bariatrics

## 2023-12-23 DIAGNOSIS — E119 Type 2 diabetes mellitus without complications: Secondary | ICD-10-CM

## 2023-12-23 DIAGNOSIS — E559 Vitamin D deficiency, unspecified: Secondary | ICD-10-CM | POA: Diagnosis not present

## 2023-12-23 DIAGNOSIS — Z6841 Body Mass Index (BMI) 40.0 and over, adult: Secondary | ICD-10-CM | POA: Diagnosis not present

## 2023-12-23 DIAGNOSIS — E66813 Obesity, class 3: Secondary | ICD-10-CM

## 2023-12-23 DIAGNOSIS — Z7985 Long-term (current) use of injectable non-insulin antidiabetic drugs: Secondary | ICD-10-CM

## 2023-12-23 MED ORDER — VITAMIN D (ERGOCALCIFEROL) 1.25 MG (50000 UNIT) PO CAPS: 50000.0000 [IU] | ORAL_CAPSULE | ORAL | 0 refills | Status: DC

## 2023-12-23 NOTE — Progress Notes (Signed)
 WEIGHT SUMMARY AND BIOMETRICS  Weight Lost Since Last Visit: 6lb  Weight Gained Since Last Visit: 0   Vitals Temp: 97.8 F (36.6 C) BP: 134/82 Pulse Rate: 89 SpO2: 98 %   Anthropometric Measurements Height: 5' 5 (1.651 m) Weight: 269 lb (122 kg) BMI (Calculated): 44.76 Weight at Last Visit: 275lb Weight Lost Since Last Visit: 6lb Weight Gained Since Last Visit: 0 Starting Weight: 279lb Total Weight Loss (lbs): 10 lb (4.536 kg) Peak Weight: 279lb   Body Composition  Body Fat %: 49.3 % Fat Mass (lbs): 133 lbs Muscle Mass (lbs): 129.8 lbs Total Body Water (lbs): 97.8 lbs Visceral Fat Rating : 17   Other Clinical Data Fasting: no Labs: no Today's Visit #: 3 Starting Date: 10/31/23    OBESITY Leanne is here to discuss her progress with her obesity treatment plan along with follow-up of her obesity related diagnoses.    Nutrition Plan: the Category 3 plan - 40% adherence.  Current exercise: yard work  Interim History:  She is down another 6 lbs since her last visit. She states that the medication is working with her appetite. She has skipped some meals.  Eating all of the food on the plan., Protein intake is as prescribed, Is skipping meals, and Water intake is adequate.   Pharmacotherapy: Sarie is on Mounjaro  2.5 mg SQ weekly Adverse side effects: None Hunger is moderately controlled.  Cravings are moderately controlled.  Assessment/Plan:   Type II Diabetes HgbA1c is not at goal. Last A1c was 7.8 CBGs: Not checking      Episodes of hypoglycemia: no Medication(s): Mounjaro  2.5 mg SQ weekly  Lab Results  Component Value Date   HGBA1C 7.8 (H) 10/31/2023   HGBA1C 7.2 (H) 03/06/2023   HGBA1C 7.0 (H) 10/04/2022   Lab Results  Component Value Date   LDLCALC 65 10/31/2023   CREATININE 0.73 10/31/2023   Lab Results  Component Value  Date   GFR 97.43 04/22/2017   GFR 76.33 01/30/2016   GFR 96.90 12/15/2014    Plan: Continue Mounjaro  2.5 mg SQ weekly Will keep all carbohydrates low both sweets and starches.  Will continue exercise regimen to 30 to 60 minutes on most days of the week.  Handouts given on healthy proteins and carbs along with recipes.   Vitamin D  Deficiency Vitamin D  is not at goal of 50.  Most recent vitamin D  level was 24.1. She is on  prescription ergocalciferol  50,000 IU weekly. Lab Results  Component Value Date   VD25OH 24.1 (L) 10/31/2023    Plan: Refill prescription vitamin D  50,000 IU weekly. .     Morbid Obesity: Current BMI BMI (Calculated): 44.76   Pharmacotherapy Plan Continue Mounjaro  2.5 mg SQ weekly  Makaylyn is currently in the action stage of change. As such, her goal is to continue with weight loss efforts.  She has agreed  to the Category 3 plan.  Exercise goals: All adults should avoid inactivity. Some physical activity is better than none, and adults who participate in any amount of physical activity gain some health benefits. She is working in the yard on a regular basis.   Behavioral modification strategies: increasing lean protein intake, no meal skipping, decrease eating out, meal planning , increase water intake, better snacking choices, planning for success, increasing vegetables, increasing lower sugar fruits, increasing fiber rich foods, decrease snacking , avoiding temptations, weigh protein portions, work on smaller portions, pack lunch for work, and mindful eating.  Jenne has agreed to follow-up with our clinic in 2 weeks.    Objective:   VITALS: Per patient if applicable, see vitals. GENERAL: Alert and in no acute distress. CARDIOPULMONARY: No increased WOB. Speaking in clear sentences.  PSYCH: Pleasant and cooperative. Speech normal rate and rhythm. Affect is appropriate. Insight and judgement are appropriate. Attention is focused, linear, and appropriate.   NEURO: Oriented as arrived to appointment on time with no prompting.   Attestation Statements:   This was prepared with the assistance of Engineer, civil (consulting).  Occasional wrong-word or sound-a-like substitutions may have occurred due to the inherent limitations of voice recognition   Clayborne Daring, DO

## 2024-01-08 ENCOUNTER — Telehealth: Payer: Self-pay | Admitting: Bariatrics

## 2024-01-08 NOTE — Telephone Encounter (Signed)
 Spoke to patient and notified her that Dr. Delores is out of the office for today and I will notify her of the prescription to be sent in and dosage increase. Patient verbalized understanding.

## 2024-01-08 NOTE — Telephone Encounter (Signed)
 The patient requested to have Mounjaro  injections called in and to increase the dosage. Patient stated this was told by Dr.Brown. Send her a Clinical cytogeneticist message once completed

## 2024-01-09 ENCOUNTER — Other Ambulatory Visit: Payer: Self-pay | Admitting: Bariatrics

## 2024-01-09 MED ORDER — TIRZEPATIDE 5 MG/0.5ML ~~LOC~~ SOAJ: 5.0000 mg | SUBCUTANEOUS | 0 refills | Status: DC

## 2024-01-09 NOTE — Telephone Encounter (Addendum)
 Left message for patient to return call. Will also send a MyChart message to patient.

## 2024-01-13 ENCOUNTER — Ambulatory Visit: Admitting: Bariatrics

## 2024-01-13 ENCOUNTER — Encounter: Payer: Self-pay | Admitting: Bariatrics

## 2024-01-13 VITALS — BP 137/84 | HR 82 | Temp 98.4°F | Ht 65.0 in | Wt 262.0 lb

## 2024-01-13 DIAGNOSIS — E119 Type 2 diabetes mellitus without complications: Secondary | ICD-10-CM

## 2024-01-13 DIAGNOSIS — E559 Vitamin D deficiency, unspecified: Secondary | ICD-10-CM | POA: Diagnosis not present

## 2024-01-13 DIAGNOSIS — Z7984 Long term (current) use of oral hypoglycemic drugs: Secondary | ICD-10-CM

## 2024-01-13 DIAGNOSIS — Z6841 Body Mass Index (BMI) 40.0 and over, adult: Secondary | ICD-10-CM

## 2024-01-13 DIAGNOSIS — E66813 Obesity, class 3: Secondary | ICD-10-CM

## 2024-01-13 NOTE — Progress Notes (Signed)
 WEIGHT SUMMARY AND BIOMETRICS  Weight Lost Since Last Visit: 7lb  Weight Gained Since Last Visit: 0lb   Vitals Temp: 98.4 F (36.9 C) BP: 137/84 Pulse Rate: 82 SpO2: 100 %   Anthropometric Measurements Height: 5' 5 (1.651 m) Weight: 262 lb (118.8 kg) BMI (Calculated): 43.6 Weight at Last Visit: 269lb Weight Lost Since Last Visit: 7lb Weight Gained Since Last Visit: 0lb Starting Weight: 279lb Total Weight Loss (lbs): 17 lb (7.711 kg)   Body Composition  Body Fat %: 49.4 % Fat Mass (lbs): 129.4 lbs Muscle Mass (lbs): 126 lbs Total Body Water (lbs): 97.6 lbs Visceral Fat Rating : 17   Other Clinical Data Fasting: No Labs: No Today's Visit #: 4 Starting Date: 10/31/23    OBESITY Nazyia is here to discuss her progress with her obesity treatment plan along with follow-up of her obesity related diagnoses.    Nutrition Plan: the Category 3 plan - 40-45% adherence.  Current exercise: none  Interim History:  She is down an additional 7 lbs since her last visit.  Eating all of the food on the plan., Is not skipping meals, Water intake is adequate., Denies polyphagia, and Denies excessive cravings.   Pharmacotherapy: Heran is on Metformin  500 mg twice daily with meals Adverse side effects: None Hunger is well controlled.  Cravings are well controlled.  Assessment/Plan:   Type II Diabetes HgbA1c is not at goal. Last A1c was 7.8 CBGs: Not checking      Episodes of hypoglycemia: no Medication(s): Metformin  500 mg twice daily with meals  Lab Results  Component Value Date   HGBA1C 7.8 (H) 10/31/2023   HGBA1C 7.2 (H) 03/06/2023   HGBA1C 7.0 (H) 10/04/2022   Lab Results  Component Value Date   LDLCALC 65 10/31/2023   CREATININE 0.73 10/31/2023   Lab Results  Component Value Date   GFR 97.43 04/22/2017   GFR 76.33 01/30/2016   GFR 96.90  12/15/2014    Plan: Continue and refill Metformin  500 mg twice daily with meals Continue all other medications.  Will keep all carbohydrates low both sweets and starches.  Will continue exercise regimen to 30 to 60 minutes on most days of the week.  Aim for 7 to 9 hours of sleep nightly.  Eat more low glycemic index foods.   Vitamin D  Deficiency Vitamin D  is not at goal of 50.  Most recent vitamin D  level was 24.1. She is on  prescription ergocalciferol  50,000 IU weekly. Lab Results  Component Value Date   VD25OH 24.1 (L) 10/31/2023    Plan: Continue prescription vitamin D  50,000 IU weekly.    Morbid Obesity: Current BMI BMI (Calculated): 43.6   Pharmacotherapy Plan Continue  Mounjaro  5.0 mg SQ weekly  Irys is currently in the action stage of change. As such, her goal is to continue with weight loss efforts.  She has  agreed to the Category 3 plan.  Exercise goals: All adults should avoid inactivity. Some physical activity is better than none, and adults who participate in any amount of physical activity gain some health benefits. She has been working in the yard. She will do more walking.   Behavioral modification strategies: increasing lean protein intake, decreasing simple carbohydrates , no meal skipping, decrease liquid calories, increase water intake, better snacking choices, planning for success, increasing fiber rich foods, decrease snacking , avoiding temptations, keep healthy foods in the home, weigh protein portions, measure portion sizes, work on smaller portions, pack lunch for work, and mindful eating.  Annalisa has agreed to follow-up with our clinic in 2 weeks.    Objective:   VITALS: Per patient if applicable, see vitals. GENERAL: Alert and in no acute distress. CARDIOPULMONARY: No increased WOB. Speaking in clear sentences.  PSYCH: Pleasant and cooperative. Speech normal rate and rhythm. Affect is appropriate. Insight and judgement are appropriate.  Attention is focused, linear, and appropriate.  NEURO: Oriented as arrived to appointment on time with no prompting.   Attestation Statements:   This was prepared with the assistance of Engineer, civil (consulting).  Occasional wrong-word or sound-a-like substitutions may have occurred due to the inherent limitations of voice recognition   Clayborne Daring, DO

## 2024-01-24 ENCOUNTER — Other Ambulatory Visit: Payer: Self-pay | Admitting: Bariatrics

## 2024-01-24 DIAGNOSIS — E559 Vitamin D deficiency, unspecified: Secondary | ICD-10-CM

## 2024-01-24 DIAGNOSIS — Z6841 Body Mass Index (BMI) 40.0 and over, adult: Secondary | ICD-10-CM

## 2024-01-28 ENCOUNTER — Other Ambulatory Visit: Payer: Self-pay | Admitting: Bariatrics

## 2024-02-03 ENCOUNTER — Other Ambulatory Visit: Payer: Self-pay | Admitting: Bariatrics

## 2024-02-03 ENCOUNTER — Telehealth: Payer: Self-pay | Admitting: Bariatrics

## 2024-02-03 MED ORDER — TIRZEPATIDE 5 MG/0.5ML ~~LOC~~ SOAJ: 5.0000 mg | SUBCUTANEOUS | 0 refills | Status: DC

## 2024-02-03 NOTE — Telephone Encounter (Signed)
 Pt is calling because she need a refill on her RX Mounjaro . It takes the pharmacy at least 3 days before pt received the medication. Pt has a an appointment on Wednesday 02/05/24.

## 2024-02-05 ENCOUNTER — Ambulatory Visit: Admitting: Bariatrics

## 2024-02-05 ENCOUNTER — Encounter: Payer: Self-pay | Admitting: Bariatrics

## 2024-02-05 VITALS — BP 131/83 | HR 97 | Temp 97.9°F | Ht 65.0 in | Wt 254.0 lb

## 2024-02-05 DIAGNOSIS — Z7985 Long-term (current) use of injectable non-insulin antidiabetic drugs: Secondary | ICD-10-CM

## 2024-02-05 DIAGNOSIS — E119 Type 2 diabetes mellitus without complications: Secondary | ICD-10-CM

## 2024-02-05 DIAGNOSIS — Z6841 Body Mass Index (BMI) 40.0 and over, adult: Secondary | ICD-10-CM

## 2024-02-05 DIAGNOSIS — E66813 Obesity, class 3: Secondary | ICD-10-CM

## 2024-02-05 DIAGNOSIS — E559 Vitamin D deficiency, unspecified: Secondary | ICD-10-CM | POA: Diagnosis not present

## 2024-02-05 MED ORDER — VITAMIN D (ERGOCALCIFEROL) 1.25 MG (50000 UNIT) PO CAPS: 50000.0000 [IU] | ORAL_CAPSULE | ORAL | 0 refills | Status: DC

## 2024-02-05 NOTE — Progress Notes (Signed)
 WEIGHT SUMMARY AND BIOMETRICS  Weight Lost Since Last Visit: 8lb  Weight Gained Since Last Visit: 0   Vitals Temp: 97.9 F (36.6 C) BP: 131/83 Pulse Rate: 97 SpO2: 99 %   Anthropometric Measurements Height: 5' 5 (1.651 m) Weight: 254 lb (115.2 kg) BMI (Calculated): 42.27 Weight at Last Visit: 262lb Weight Lost Since Last Visit: 8lb Weight Gained Since Last Visit: 0 Starting Weight: 279lb Total Weight Loss (lbs): 25 lb (11.3 kg)   Body Composition  Body Fat %: 48.8 % Fat Mass (lbs): 124.4 lbs Muscle Mass (lbs): 123.8 lbs Total Body Water (lbs): 94.8 lbs Visceral Fat Rating : 16   Other Clinical Data Fasting: yes Labs: no Today's Visit #: 5 Starting Date: 10/31/23    OBESITY Shiva is here to discuss her progress with her obesity treatment plan along with follow-up of her obesity related diagnoses.    Nutrition Plan: the Category 3 plan - 40% adherence.  Current exercise: walking  Interim History:  She is down 8 lbs since her last visit.  She states that she had several days where she had some stomach discomfort but that has gone and it only lasted about 2 days.  She states at that time she recalls that she did have some greasy food.  She states that she is walking and doing yard work Eating all of the food on the plan., Protein intake is as prescribed, Is not skipping meals, Not journaling consistently., and Water intake is adequate.   Pharmacotherapy: Lucielle is on Mounjaro  5.0 mg SQ weekly and Metformin   Adverse side effects: None Hunger is moderately controlled.  Cravings are moderately controlled.  Assessment/Plan:   Vitamin D  Deficiency Vitamin D  is not at goal of 50.  Most recent vitamin D  level was 24.1. She is on  prescription ergocalciferol  50,000 IU weekly. Lab Results  Component Value Date   VD25OH 24.1 (L) 10/31/2023     Plan: Refill prescription vitamin D  50,000 IU weekly.   Type II Diabetes HgbA1c is not at goal. Last A1c was 7.8 CBGs: Not checking      Episodes of hypoglycemia: no, not typically but did for about 2 days. She ate something that was greasy.  Medication(s): Mounjaro  5.0 mg SQ weekly and Metformin  500 mg twice daily with meals  Lab Results  Component Value Date   HGBA1C 7.8 (H) 10/31/2023   HGBA1C 7.2 (H) 03/06/2023   HGBA1C 7.0 (H) 10/04/2022   Lab Results  Component Value Date   LDLCALC 65 10/31/2023   CREATININE 0.73 10/31/2023   Lab Results  Component Value Date   GFR 97.43 04/22/2017   GFR 76.33 01/30/2016   GFR 96.90 12/15/2014    Plan: Continue Mounjaro  5.0 mg SQ weekly Continue all other medications.  Will keep all carbohydrates low both sweets and starches.  Will continue exercise regimen to 30 to 60  minutes on most days of the week.  Aim for 7 to 9 hours of sleep nightly.  Eat more low glycemic index foods.  She will avoid any greasy foods and will not eat too much at any given meal. She will consider papaya enzymes for any stomach discomfort. She will continue to exercise walking and doing her yard work.   Morbid Obesity: Current BMI BMI (Calculated): 42.27   Pharmacotherapy Plan Continue Mounjaro .   Evi is currently in the action stage of change. As such, her goal is to continue with weight loss efforts.  She has agreed to the Category 3 plan.  Exercise goals: All adults should avoid inactivity. Some physical activity is better than none, and adults who participate in any amount of physical activity gain some health benefits.  Behavioral modification strategies: increasing lean protein intake, no meal skipping, decrease eating out, meal planning , increase water intake, better snacking choices, planning for success, increase frequency of journaling, weigh protein portions, pack lunch for work, and mindful eating.  Lianna has agreed to follow-up  with our clinic in 4 weeks.   Objective:   VITALS: Per patient if applicable, see vitals. GENERAL: Alert and in no acute distress. CARDIOPULMONARY: No increased WOB. Speaking in clear sentences.  PSYCH: Pleasant and cooperative. Speech normal rate and rhythm. Affect is appropriate. Insight and judgement are appropriate. Attention is focused, linear, and appropriate.  NEURO: Oriented as arrived to appointment on time with no prompting.   Attestation Statements:   This was prepared with the assistance of Engineer, civil (consulting).  Occasional wrong-word or sound-a-like substitutions may have occurred due to the inherent limitations of voice recognition   Clayborne Daring, DO

## 2024-02-07 ENCOUNTER — Ambulatory Visit: Admitting: Nurse Practitioner

## 2024-02-15 ENCOUNTER — Other Ambulatory Visit: Payer: Self-pay | Admitting: Family Medicine

## 2024-02-24 ENCOUNTER — Other Ambulatory Visit: Payer: Self-pay | Admitting: Family Medicine

## 2024-02-25 ENCOUNTER — Other Ambulatory Visit: Payer: Self-pay | Admitting: Bariatrics

## 2024-02-27 ENCOUNTER — Other Ambulatory Visit: Payer: Self-pay | Admitting: Bariatrics

## 2024-02-27 DIAGNOSIS — Z6841 Body Mass Index (BMI) 40.0 and over, adult: Secondary | ICD-10-CM

## 2024-02-27 DIAGNOSIS — E559 Vitamin D deficiency, unspecified: Secondary | ICD-10-CM

## 2024-03-05 ENCOUNTER — Telehealth: Payer: Self-pay | Admitting: Bariatrics

## 2024-03-05 DIAGNOSIS — E66813 Obesity, class 3: Secondary | ICD-10-CM

## 2024-03-05 MED ORDER — MOUNJARO 5 MG/0.5ML ~~LOC~~ SOAJ
5.0000 mg | SUBCUTANEOUS | 0 refills | Status: DC
Start: 2024-03-05 — End: 2024-04-08

## 2024-03-05 NOTE — Telephone Encounter (Signed)
 Notified patient there was two [facility] pharmacies in her chart. Resent to right pharmacy

## 2024-03-05 NOTE — Telephone Encounter (Signed)
 Pt is requesting a refill on Mounjaro . Pt is out of her medication and miss this week dosage. Please send RX to [facility].

## 2024-03-06 ENCOUNTER — Other Ambulatory Visit: Payer: Self-pay | Admitting: Bariatrics

## 2024-03-06 ENCOUNTER — Other Ambulatory Visit: Payer: Self-pay | Admitting: Family Medicine

## 2024-03-06 DIAGNOSIS — E119 Type 2 diabetes mellitus without complications: Secondary | ICD-10-CM

## 2024-03-06 DIAGNOSIS — E66813 Obesity, class 3: Secondary | ICD-10-CM

## 2024-03-06 DIAGNOSIS — E1169 Type 2 diabetes mellitus with other specified complication: Secondary | ICD-10-CM

## 2024-03-06 NOTE — Telephone Encounter (Signed)
 E-scribed refill.  Pls schedule CPE and fasting lab visit (no food/drink- except water and/or blk coffee 5 hrs prior) for additional refills.

## 2024-03-10 NOTE — Telephone Encounter (Signed)
 I spoked with patient and she has been scheduled for her physical, she was wondering if she can have her lab orders sent to [facility] and do it there?

## 2024-03-11 ENCOUNTER — Ambulatory Visit: Admitting: Bariatrics

## 2024-03-11 ENCOUNTER — Encounter: Payer: Self-pay | Admitting: Bariatrics

## 2024-03-11 VITALS — BP 135/78 | HR 94 | Temp 97.8°F | Ht 65.0 in | Wt 247.0 lb

## 2024-03-11 DIAGNOSIS — E119 Type 2 diabetes mellitus without complications: Secondary | ICD-10-CM

## 2024-03-11 DIAGNOSIS — E559 Vitamin D deficiency, unspecified: Secondary | ICD-10-CM | POA: Diagnosis not present

## 2024-03-11 DIAGNOSIS — Z6841 Body Mass Index (BMI) 40.0 and over, adult: Secondary | ICD-10-CM | POA: Diagnosis not present

## 2024-03-11 DIAGNOSIS — Z7985 Long-term (current) use of injectable non-insulin antidiabetic drugs: Secondary | ICD-10-CM

## 2024-03-11 NOTE — Progress Notes (Signed)
 WEIGHT SUMMARY AND BIOMETRICS  Weight Lost Since Last Visit: 7lb  Weight Gained Since Last Visit: 0   Vitals Temp: 97.8 F (36.6 C) BP: 135/78 Pulse Rate: 94 SpO2: 99 %   Anthropometric Measurements Height: 5' 5 (1.651 m) Weight: 247 lb (112 kg) BMI (Calculated): 41.1 Weight at Last Visit: 254lb Weight Lost Since Last Visit: 7lb Weight Gained Since Last Visit: 0 Starting Weight: 279lb Total Weight Loss (lbs): 32 lb (14.5 kg)   Body Composition  Body Fat %: 47.7 % Fat Mass (lbs): 118 lbs Muscle Mass (lbs): 122.6 lbs Total Body Water (lbs): 92.8 lbs Visceral Fat Rating : 16   Other Clinical Data Fasting: no Labs: no Today's Visit #: 6 Starting Date: 10/31/23    OBESITY Kim Roberts is here to discuss her progress with her obesity treatment plan along with follow-up of her obesity related diagnoses.    Nutrition Plan: the Category 3 plan - 45% adherence.  Current exercise: yard work  Interim History:  She is down another 7 lbs since her last visit.  Eating all of the food on the plan., Protein intake is as prescribed, and Water intake is adequate.   Pharmacotherapy: Kim Roberts is on Mounjaro  5.0 mg SQ weekly Adverse side effects: None Hunger is moderately controlled.  Cravings are well controlled.  Assessment/Plan:   Type II Diabetes HgbA1c is not at goal. Last A1c was 7.8 Episodes of hypoglycemia: no Medication(s): Mounjaro  5.0 mg SQ weekly  Lab Results  Component Value Date   HGBA1C 7.8 (H) 10/31/2023   HGBA1C 7.2 (H) 03/06/2023   HGBA1C 7.0 (H) 10/04/2022   Lab Results  Component Value Date   LDLCALC 65 10/31/2023   CREATININE 0.73 10/31/2023   Lab Results  Component Value Date   GFR 97.43 04/22/2017   GFR 76.33 01/30/2016   GFR 96.90 12/15/2014    Plan: Continue Mounjaro  5.0 mg SQ weekly Continue all other medications.  Will  keep all carbohydrates low both sweets and starches.  Will continue exercise regimen to 30 to 60 minutes on most days of the week.  Aim for 7 to 9 hours of sleep nightly.  Eat more low glycemic index foods.  Will increase her protein.  She will pack her lunch daily.   Vitamin D  Insuffiency: Vitamin D  is not at goal of 50.  Most recent vitamin D  level was 24.1. She is on  prescription ergocalciferol  50,000 IU weekly. Lab Results  Component Value Date   VD25OH 24.1 (L) 10/31/2023    Plan: Continue prescription vitamin D  50,000 IU weekly.    Morbid Obesity: Current BMI BMI (Calculated): 41.1   Pharmacotherapy Plan Continue  Mounjaro  5.0 mg SQ weekly and Metformin  500 mg twice daily with meals  Kim Roberts is currently in the action stage of change. As such, her goal is to continue with weight loss efforts.  She has  agreed to the Category 3 plan.  Exercise goals: All adults should avoid inactivity. Some physical activity is better than none, and adults who participate in any amount of physical activity gain some health benefits. She has been doing some yard work.   Behavioral modification strategies: increasing lean protein intake, no meal skipping, meal planning , increasing vegetables, keep healthy foods in the home, and weigh protein portions.  Kim Roberts has agreed to follow-up with our clinic in 3 weeks for fasting labs.   Objective:   VITALS: Per patient if applicable, see vitals. GENERAL: Alert and in no acute distress. CARDIOPULMONARY: No increased WOB. Speaking in clear sentences.  PSYCH: Pleasant and cooperative. Speech normal rate and rhythm. Affect is appropriate. Insight and judgement are appropriate. Attention is focused, linear, and appropriate.  NEURO: Oriented as arrived to appointment on time with no prompting.   Attestation Statements:   This was prepared with the assistance of Engineer, civil (consulting).  Occasional wrong-word or sound-a-like substitutions may have  occurred due to the inherent limitations of voice recognition   Kim Daring, DO

## 2024-03-11 NOTE — Telephone Encounter (Signed)
 I put in the follow-up orders for A1c uric acid and vitamin D .  She does not need to repeat everything else given that she had labs done relatively recently.  I put in the orders so she should be able to get them done at [facility].  Thanks.

## 2024-03-11 NOTE — Telephone Encounter (Signed)
 Patient notified

## 2024-03-11 NOTE — Addendum Note (Signed)
 Addended by: CLEATUS ARLYSS RAMAN on: 03/11/2024 04:33 PM   Modules accepted: Orders

## 2024-03-16 ENCOUNTER — Ambulatory Visit: Payer: Self-pay

## 2024-03-16 ENCOUNTER — Other Ambulatory Visit: Payer: Self-pay | Admitting: Family Medicine

## 2024-03-16 MED ORDER — DICYCLOMINE HCL 10 MG PO CAPS: 10.0000 mg | ORAL_CAPSULE | Freq: Three times a day (TID) | ORAL | 0 refills | Status: DC

## 2024-03-16 MED ORDER — DICYCLOMINE HCL 10 MG PO CAPS: 10.0000 mg | ORAL_CAPSULE | Freq: Three times a day (TID) | ORAL | Status: DC

## 2024-03-16 NOTE — Addendum Note (Signed)
 Addended by: CLEATUS ARLYSS RAMAN on: 03/16/2024 06:01 PM   Modules accepted: Orders

## 2024-03-16 NOTE — Telephone Encounter (Signed)
 FYI Only or Action Required?: Action required by provider: Request for medication for abd pain.  Patient was last seen in primary care on 03/11/2024 by Kim Roberts A, DO.  Called Nurse Triage reporting Abdominal Pain.  Symptoms began a week ago.  Interventions attempted: OTC medications: Pepto bismal, tums and Other: Black pepper.  Symptoms are: gradually worsening.  Triage Disposition: See Physician Within 24 Hours  Patient/caregiver understands and will follow disposition?: Unsure    Copied from CRM #[id number]. Topic: Clinical - Red Word Triage >> Mar 16, 2024 12:11 PM Joesph NOVAK wrote: Red Word that prompted transfer to Nurse Triage:  Severe adominal pain since last week, believes it is food poisioning. Reason for Disposition  [1] MODERATE pain (e.g., interferes with normal activities) AND [2] pain comes and goes (cramps) AND [3] present > 24 hours  (Exception: Pain with Vomiting or Diarrhea - see that Guideline.)    She states the pain is constant  Answer Assessment - Initial Assessment Questions Black pepper, Pepto bismal and tums for symptoms. Patient requesting for medication to be sent in to pharmacy for symptoms. Patient also scheduled for an appointment tomorrow, she states she may not be able to make the appointment but wanted to have the appointment just in case. Preferred pharmacy below. Requesting a call back regarding her request.   [facility]  [address], [place], [place] [address]  1. LOCATION: Where does it hurt?      All over abdomen  2. RADIATION: Does the pain shoot anywhere else? (e.g., chest, back)     In her back some  3. ONSET: When did the pain begin? (e.g., minutes, hours or days ago)      Thursday morning  4. SUDDEN: Gradual or sudden onset?     Sudden  5. PATTERN Does the pain come and go, or is it constant?     Constant  6. SEVERITY: How bad is the pain?  (e.g., Scale 1-10; mild, moderate, or severe)     6/10 8. CAUSE: What do you think is  causing the stomach pain? (e.g., gallstones, recent abdominal surgery)     Possible food posioining  9. RELIEVING/AGGRAVATING FACTORS: What makes it better or worse? (e.g., antacids, bending or twisting motion, bowel movement)     Heating pad and black coffee helped symptoms.  10. OTHER SYMPTOMS: Do you have any other symptoms? (e.g., back pain, diarrhea, fever, urination pain, vomiting)       Gas,  Protocols used: Abdominal Pain - Female-A-AH

## 2024-03-17 ENCOUNTER — Encounter: Payer: Self-pay | Admitting: Family Medicine

## 2024-03-17 ENCOUNTER — Ambulatory Visit: Admitting: Family Medicine

## 2024-03-17 VITALS — BP 138/74 | HR 87 | Temp 98.7°F | Ht 65.0 in | Wt 245.2 lb

## 2024-03-17 DIAGNOSIS — E119 Type 2 diabetes mellitus without complications: Secondary | ICD-10-CM

## 2024-03-17 DIAGNOSIS — I10 Essential (primary) hypertension: Secondary | ICD-10-CM

## 2024-03-17 DIAGNOSIS — M109 Gout, unspecified: Secondary | ICD-10-CM

## 2024-03-17 DIAGNOSIS — G4733 Obstructive sleep apnea (adult) (pediatric): Secondary | ICD-10-CM

## 2024-03-17 DIAGNOSIS — R109 Unspecified abdominal pain: Secondary | ICD-10-CM | POA: Diagnosis not present

## 2024-03-17 DIAGNOSIS — Z0000 Encounter for general adult medical examination without abnormal findings: Secondary | ICD-10-CM

## 2024-03-17 DIAGNOSIS — E559 Vitamin D deficiency, unspecified: Secondary | ICD-10-CM

## 2024-03-17 DIAGNOSIS — E1169 Type 2 diabetes mellitus with other specified complication: Secondary | ICD-10-CM

## 2024-03-17 DIAGNOSIS — Z7189 Other specified counseling: Secondary | ICD-10-CM

## 2024-03-17 MED ORDER — ATORVASTATIN CALCIUM 20 MG PO TABS: 20.0000 mg | ORAL_TABLET | Freq: Every day | ORAL | 3 refills | Status: AC

## 2024-03-17 MED ORDER — LISINOPRIL 20 MG PO TABS: 20.0000 mg | ORAL_TABLET | Freq: Every day | ORAL | 3 refills | Status: AC

## 2024-03-17 MED ORDER — METFORMIN HCL 500 MG PO TABS: 500.0000 mg | ORAL_TABLET | Freq: Two times a day (BID) | ORAL | 3 refills | Status: AC

## 2024-03-17 NOTE — Progress Notes (Unsigned)
 CPE- See plan.  Routine anticipatory guidance given to patient.  See health maintenance.  The possibility exists that previously documented standard health maintenance information may have been brought forward from a previous encounter into this note.  If needed, that same information has been updated to reflect the current situation based on today's encounter.    Tetanus 2022 Flu done to be done this fall.   PNA d/w pt.   Shingles d/w pt.   covid vaccine 2021 Pap per gyn 2024 Mammogram 2025 DXA d/w pt. Defer 2025 Colonoscopy 11/27/21 per GI.   Living will d/w pt. Brother Sandra Roche designated if patient were incapacitated.   Diet and exercise d/w pt.    Abd pain since 03/12/24.  Better now.  She had vomited 3-4 times, most recently last week.  Less nausea now.  Abd feels sore.  No diarrhea.  Some constipation and that is atypical.  No bloody or black stools.  Last took mounjaro  on 03/13/24.  She had some pork prior to sx onset, unclear if that was related.    Hypertension:    Using medication without problems or lightheadedness: yes Chest pain with exertion:no Edema:no Short of breath:no  She has not noted breast changes in the meantime.   She is seeing pulmonary re: OSA.   No recent colchicine  use or need.  Uric acid level pending.    Elevated Cholesterol: Using medications without problems: yes Muscle aches: no Diet compliance: d/w pt.  Exercise: d/w pt    Diabetes:  Taking Mounjaro  and metformin .  Unclear if those are related to recent GI sx.  Hypoglycemic episodes: no sx Hyperglycemic episodes: no sx Feet problems: no Blood Sugars averaging: not checked.   eye exam within last year: due, d/w pt.    Vit D def on replacement.  See notes on labs.   PMH and SH reviewed  Meds, vitals, and allergies reviewed.   ROS: Per HPI.  Unless specifically indicated otherwise in HPI, the patient denies:  General: fever. Eyes: acute vision changes ENT: sore throat Cardiovascular:  chest pain Respiratory: SOB GI: vomiting GU: dysuria Musculoskeletal: acute back pain Derm: acute rash Neuro: acute motor dysfunction Psych: worsening mood Endocrine: polydipsia Heme: bleeding Allergy: hayfever  PMH and SH reviewed  Meds, vitals, and allergies reviewed.   ROS: Per HPI unless specifically indicated in ROS section   GEN: nad, alert and oriented HEENT: mucous membranes moist NECK: supple w/o LA CV: rrr. PULM: ctab, no inc wob ABD: soft, +bs, abdomen minimally tender to palpation without rebound. EXT: no edema SKIN: no acute rash  Diabetic foot exam: Normal inspection No skin breakdown No calluses  Normal DP pulses Normal sensation to light touch and monofilament Nails normal

## 2024-03-17 NOTE — Patient Instructions (Addendum)
 Go to the lab on the way out.   If you have mychart we'll likely use that to update you.    Take care.  Glad to see you. Flu/shingles/pneumonia shot when feeling better.   If better, then I wouldn't take dicyclomine.  Update me as needed.    I would hold mounjaro  and metformin  until you are feeling better.

## 2024-03-18 DIAGNOSIS — R109 Unspecified abdominal pain: Secondary | ICD-10-CM | POA: Insufficient documentation

## 2024-03-18 LAB — CBC WITH DIFFERENTIAL/PLATELET
Basophils Absolute: 0 x10E3/uL (ref 0.0–0.2)
Basos: 0 %
EOS (ABSOLUTE): 0.1 x10E3/uL (ref 0.0–0.4)
Eos: 1 %
Hematocrit: 39.7 % (ref 34.0–46.6)
Hemoglobin: 12.6 g/dL (ref 11.1–15.9)
Immature Grans (Abs): 0 x10E3/uL (ref 0.0–0.1)
Immature Granulocytes: 0 %
Lymphocytes Absolute: 3.1 x10E3/uL (ref 0.7–3.1)
Lymphs: 47 %
MCH: 30 pg (ref 26.6–33.0)
MCHC: 31.7 g/dL (ref 31.5–35.7)
MCV: 95 fL (ref 79–97)
Monocytes Absolute: 0.5 x10E3/uL (ref 0.1–0.9)
Monocytes: 7 %
Neutrophils Absolute: 3 x10E3/uL (ref 1.4–7.0)
Neutrophils: 45 %
Platelets: 331 x10E3/uL (ref 150–450)
RBC: 4.2 x10E6/uL (ref 3.77–5.28)
RDW: 14.5 % (ref 11.7–15.4)
WBC: 6.6 x10E3/uL (ref 3.4–10.8)

## 2024-03-18 LAB — URIC ACID: Uric Acid: 8.2 mg/dL — ABNORMAL HIGH (ref 3.0–7.2)

## 2024-03-18 LAB — COMPREHENSIVE METABOLIC PANEL WITH GFR
ALT: 16 IU/L (ref 0–32)
AST: 19 IU/L (ref 0–40)
Albumin: 4.2 g/dL (ref 3.9–4.9)
Alkaline Phosphatase: 80 IU/L (ref 49–135)
BUN/Creatinine Ratio: 16 (ref 12–28)
BUN: 15 mg/dL (ref 8–27)
Bilirubin Total: 1.4 mg/dL — ABNORMAL HIGH (ref 0.0–1.2)
CO2: 25 mmol/L (ref 20–29)
Calcium: 10.8 mg/dL — ABNORMAL HIGH (ref 8.7–10.3)
Chloride: 101 mmol/L (ref 96–106)
Creatinine, Ser: 0.95 mg/dL (ref 0.57–1.00)
Globulin, Total: 2.5 g/dL (ref 1.5–4.5)
Glucose: 99 mg/dL (ref 70–99)
Potassium: 4.3 mmol/L (ref 3.5–5.2)
Sodium: 143 mmol/L (ref 134–144)
Total Protein: 6.7 g/dL (ref 6.0–8.5)
eGFR: 68 mL/min/1.73 (ref >59)

## 2024-03-18 LAB — LIPASE: Lipase: 54 U/L (ref 14–72)

## 2024-03-18 LAB — HEMOGLOBIN A1C
Est. average glucose Bld gHb Est-mCnc: 148 mg/dL
Hgb A1c MFr Bld: 6.8 % — ABNORMAL HIGH (ref 4.8–5.6)

## 2024-03-18 LAB — VITAMIN D 25 HYDROXY (VIT D DEFICIENCY, FRACTURES): Vit D, 25-Hydroxy: 64.2 ng/mL (ref 30.0–100.0)

## 2024-03-18 NOTE — Assessment & Plan Note (Signed)
 See notes on follow-up labs.  She had previously tolerated Mounjaro  and metformin .  Would hold both now until she has resolution of abdominal symptoms.  Recheck periodically.  She could restart both medications if she is feeling better and tolerates the restart.

## 2024-03-18 NOTE — Assessment & Plan Note (Signed)
Living will d/w pt. Brother Garry Teel designated if patient were incapacitated.   

## 2024-03-18 NOTE — Assessment & Plan Note (Signed)
 Continue work on diet and exercise.  Continue atorvastatin .  See notes on labs.

## 2024-03-18 NOTE — Assessment & Plan Note (Signed)
 No recent colchicine  use or need.  See notes on labs.

## 2024-03-18 NOTE — Assessment & Plan Note (Signed)
 Tetanus 2022 Flu done to be done this fall.   PNA d/w pt.   Shingles d/w pt.   covid vaccine 2021 Pap per gyn 2024 Mammogram 2025 DXA d/w pt. Defer 2025 Colonoscopy 11/27/21 per GI.   Living will d/w pt. Brother Sandra Roche designated if patient were incapacitated.   Diet and exercise d/w pt.

## 2024-03-18 NOTE — Assessment & Plan Note (Signed)
 She is improving in the meantime.  Abdomen is minimally tender, overall benign exam.  It could be that she has a fluid related/self resolving issue.  I would hold metformin  and Mounjaro  in the meantime, until she is feeling better, in case those are contributing to her symptoms.  Rationale discussed with patient.  She agrees to plan.  See notes on labs.

## 2024-03-18 NOTE — Assessment & Plan Note (Signed)
 Vit D def on replacement.  See notes on labs.

## 2024-03-18 NOTE — Assessment & Plan Note (Signed)
Continue lisinopril.  See notes on labs.  Continue work on diet and exercise.

## 2024-03-18 NOTE — Assessment & Plan Note (Signed)
 She is seeing pulmonary re: OSA.

## 2024-03-22 ENCOUNTER — Ambulatory Visit: Payer: Self-pay | Admitting: Family Medicine

## 2024-03-22 ENCOUNTER — Other Ambulatory Visit: Payer: Self-pay | Admitting: Family Medicine

## 2024-03-22 MED ORDER — DICYCLOMINE HCL 10 MG PO CAPS
10.0000 mg | ORAL_CAPSULE | Freq: Three times a day (TID) | ORAL | 1 refills | Status: AC
Start: 2024-03-22 — End: ?

## 2024-03-25 ENCOUNTER — Other Ambulatory Visit: Payer: Self-pay | Admitting: Bariatrics

## 2024-03-25 DIAGNOSIS — E66813 Obesity, class 3: Secondary | ICD-10-CM

## 2024-04-01 ENCOUNTER — Telehealth: Payer: Self-pay | Admitting: Family Medicine

## 2024-04-01 DIAGNOSIS — M109 Gout, unspecified: Secondary | ICD-10-CM

## 2024-04-01 NOTE — Telephone Encounter (Signed)
 Please check with patient to make sure she had her follow-up labs collected at [facility].  I have not seen the results yet.  Thanks.

## 2024-04-02 NOTE — Addendum Note (Signed)
 Addended by: CLEATUS ARLYSS RAMAN on: 04/02/2024 02:10 PM   Modules accepted: Orders

## 2024-04-02 NOTE — Telephone Encounter (Signed)
Attempted to call patient, no answer. LMTCB 

## 2024-04-02 NOTE — Telephone Encounter (Signed)
 Left message to return call to our office.

## 2024-04-02 NOTE — Telephone Encounter (Signed)
 Spoke with pt asking about labs. States she is trying to get labs drawn today during her lunch brake. Also, pt requests uric acid to added to orders. States she had another gout flare this past weekend. Pls advise.

## 2024-04-02 NOTE — Telephone Encounter (Signed)
 Noted.  I put in the uric acid order. . Thanks.

## 2024-04-03 NOTE — Telephone Encounter (Unsigned)
 Copied from CRM [id number]. Topic: Clinical - Request for Lab/Test Order >> Apr 02, 2024  3:43 PM Mia F wrote: Reason for CRM: Pt was returning CMA Joellen call. Call CAL to have her speak to pt but CMA was busy and said to let pt know labs has been ordered. Pt was not sure what this was about and says if it is about the uric acid, she has already had it done.

## 2024-04-03 NOTE — Telephone Encounter (Signed)
 Spoke with pt about uric acid lab she requested. States she already had labs done before order was placed. She request uric acid be added to labs drawn earlier yesterday. Will notify our lab.   Also, pt requests to c/x 04/09/24 CPE since Dr Cleatus went ahead an did that at 03/17/24 visit.   Appt c/x.

## 2024-04-04 LAB — PTH, INTACT AND CALCIUM
Calcium: 9.3 mg/dL (ref 8.7–10.3)
PTH: 88 pg/mL — ABNORMAL HIGH (ref 15–65)

## 2024-04-06 LAB — URIC ACID: Uric Acid: 8.5 mg/dL — ABNORMAL HIGH (ref 3.0–7.2)

## 2024-04-06 LAB — SPECIMEN STATUS REPORT

## 2024-04-08 ENCOUNTER — Encounter: Payer: Self-pay | Admitting: Bariatrics

## 2024-04-08 ENCOUNTER — Ambulatory Visit: Admitting: Bariatrics

## 2024-04-08 VITALS — BP 123/81 | HR 82 | Temp 97.9°F | Ht 65.0 in | Wt 243.0 lb

## 2024-04-08 DIAGNOSIS — E119 Type 2 diabetes mellitus without complications: Secondary | ICD-10-CM | POA: Diagnosis not present

## 2024-04-08 DIAGNOSIS — Z6841 Body Mass Index (BMI) 40.0 and over, adult: Secondary | ICD-10-CM | POA: Diagnosis not present

## 2024-04-08 DIAGNOSIS — E559 Vitamin D deficiency, unspecified: Secondary | ICD-10-CM | POA: Diagnosis not present

## 2024-04-08 DIAGNOSIS — E66813 Obesity, class 3: Secondary | ICD-10-CM

## 2024-04-08 DIAGNOSIS — Z0000 Encounter for general adult medical examination without abnormal findings: Secondary | ICD-10-CM

## 2024-04-08 DIAGNOSIS — Z7985 Long-term (current) use of injectable non-insulin antidiabetic drugs: Secondary | ICD-10-CM

## 2024-04-08 MED ORDER — MOUNJARO 5 MG/0.5ML ~~LOC~~ SOAJ: 5.0000 mg | SUBCUTANEOUS | 0 refills | Status: DC

## 2024-04-08 NOTE — Progress Notes (Signed)
 WEIGHT SUMMARY AND BIOMETRICS  Weight Lost Since Last Visit: 4lb  Weight Gained Since Last Visit: 0   Vitals Temp: 97.9 F (36.6 C) BP: 123/81 Pulse Rate: 82 SpO2: 97 %   Anthropometric Measurements Height: 5' 5 (1.651 m) Weight: 243 lb (110.2 kg) BMI (Calculated): 40.44 Weight at Last Visit: 247lb Weight Lost Since Last Visit: 4lb Weight Gained Since Last Visit: 0 Starting Weight: 279lb Total Weight Loss (lbs): 36 lb (16.3 kg)   Body Composition  Body Fat %: 47.9 % Fat Mass (lbs): 116.4 lbs Muscle Mass (lbs): 120.2 lbs Total Body Water (lbs): 91.4 lbs Visceral Fat Rating : 15   Other Clinical Data Fasting: yes Labs: yes Today's Visit #: 7 Starting Date: 10/31/23    OBESITY Kim Roberts is here to discuss her progress with her obesity treatment plan along with follow-up of her obesity related diagnoses.    Nutrition Plan: the Category 3 plan - 45% adherence.  Current exercise: yard work  Interim History:  She is down 4 lbs since her last visit. She missed a dose of Mounjaro  since her last visit.  Eating all of the food on the plan., Is skipping meals, and Water intake is adequate.   Pharmacotherapy: Kim Roberts is on Mounjaro  5.0 mg SQ weekly Adverse side effects: None Hunger is moderately controlled.  Cravings are moderately controlled.  Assessment/Plan:   Type II Diabetes HgbA1c is at goal. Last A1c was 6.8 CBGs: Not checking     She checks her blood sugars if she is ill.  Episodes of hypoglycemia: no Medication(s): Mounjaro  5.0 mg SQ weekly  Lab Results  Component Value Date   HGBA1C 6.8 (H) 03/17/2024   HGBA1C 7.8 (H) 10/31/2023   HGBA1C 7.2 (H) 03/06/2023   Lab Results  Component Value Date   LDLCALC 65 10/31/2023   CREATININE 0.95 03/17/2024   Lab Results  Component Value Date   GFR 97.43 04/22/2017   GFR 76.33 01/30/2016    GFR 96.90 12/15/2014    Plan: Continue and refill Mounjaro  5.0 mg SQ weekly She had stopped the Metformin  when she was ill, but will resume and titrate back up.  Will keep all carbohydrates low both sweets and starches.  Will continue exercise regimen to 30 to 60 minutes on most days of the week.  Aim for 7 to 9 hours of sleep nightly.  Eat more low glycemic index foods.   Labs done today (CMP, Lipids, HgbA1c, B 12).    Morbid Obesity: Current BMI BMI (Calculated): 40.44   Pharmacotherapy Plan Continue and refill  Mounjaro  5.0 mg SQ weekly  Kim Roberts is currently in the action stage of change. As such, her goal is to continue with weight loss efforts.  She has agreed to the Category 3 plan.  Exercise goals: All adults should avoid inactivity. Some physical activity is better than none, and adults who  participate in any amount of physical activity gain some health benefits.  Behavioral modification strategies: increasing lean protein intake, meal planning , better snacking choices, planning for success, increasing vegetables, and decrease snacking .  Kim Roberts has agreed to follow-up with our clinic in 8 weeks.       Objective:   VITALS: Per patient if applicable, see vitals. GENERAL: Alert and in no acute distress. CARDIOPULMONARY: No increased WOB. Speaking in clear sentences.  PSYCH: Pleasant and cooperative. Speech normal rate and rhythm. Affect is appropriate. Insight and judgement are appropriate. Attention is focused, linear, and appropriate.  NEURO: Oriented as arrived to appointment on time with no prompting.   Attestation Statements:   This was prepared with the assistance of Engineer, Civil (consulting).  Occasional wrong-word or sound-a-like substitutions may have occurred due to the inherent limitations of voice recognition   Clayborne Daring, DO

## 2024-04-09 ENCOUNTER — Encounter: Admitting: Family Medicine

## 2024-04-09 LAB — COMPREHENSIVE METABOLIC PANEL WITH GFR
ALT: 16 IU/L (ref 0–32)
AST: 17 IU/L (ref 0–40)
Albumin: 4.3 g/dL (ref 3.9–4.9)
Alkaline Phosphatase: 85 IU/L (ref 49–135)
BUN/Creatinine Ratio: 22 (ref 12–28)
BUN: 18 mg/dL (ref 8–27)
Bilirubin Total: 0.7 mg/dL (ref 0.0–1.2)
CO2: 26 mmol/L (ref 20–29)
Calcium: 9.9 mg/dL (ref 8.7–10.3)
Chloride: 104 mmol/L (ref 96–106)
Creatinine, Ser: 0.83 mg/dL (ref 0.57–1.00)
Globulin, Total: 2.6 g/dL (ref 1.5–4.5)
Glucose: 99 mg/dL (ref 70–99)
Potassium: 4.1 mmol/L (ref 3.5–5.2)
Sodium: 143 mmol/L (ref 134–144)
Total Protein: 6.9 g/dL (ref 6.0–8.5)
eGFR: 80 mL/min/1.73 (ref >59)

## 2024-04-09 LAB — LIPID PANEL WITH LDL/HDL RATIO
Cholesterol, Total: 136 mg/dL (ref 100–199)
HDL: 43 mg/dL (ref >39)
LDL Chol Calc (NIH): 68 mg/dL (ref 0–99)
LDL/HDL Ratio: 1.6 ratio (ref 0.0–3.2)
Triglycerides: 147 mg/dL (ref 0–149)
VLDL Cholesterol Cal: 25 mg/dL (ref 5–40)

## 2024-04-09 LAB — VITAMIN B12: Vitamin B-12: 262 pg/mL (ref 232–1245)

## 2024-04-10 ENCOUNTER — Ambulatory Visit: Admitting: Nurse Practitioner

## 2024-04-15 ENCOUNTER — Encounter: Payer: Self-pay | Admitting: Nurse Practitioner

## 2024-04-15 ENCOUNTER — Ambulatory Visit: Admitting: Nurse Practitioner

## 2024-04-15 VITALS — BP 132/85 | HR 89 | Temp 98.5°F | Ht 65.0 in | Wt 248.6 lb

## 2024-04-15 DIAGNOSIS — G4733 Obstructive sleep apnea (adult) (pediatric): Secondary | ICD-10-CM

## 2024-04-15 DIAGNOSIS — Z6841 Body Mass Index (BMI) 40.0 and over, adult: Secondary | ICD-10-CM

## 2024-04-15 DIAGNOSIS — E66813 Obesity, class 3: Secondary | ICD-10-CM

## 2024-04-15 NOTE — Progress Notes (Signed)
 @Patient  ID: Kim Roberts, female    DOB: [birth year], 63 y.o.   MRN: [id number]  Chief Complaint  Patient presents with   Medical Management of Chronic Issues    F/u morbid obesity, OSA using CPAP     Referring provider: Cleatus Arlyss RAMAN, MD  HPI: 63 year old female, never smoker followed for OSA on CPAP.  Past medical history significant for hypertension, DM, OSA, gout, history of breast cancer, obesity.  TEST/EVENTS:  12/19/2021 split-night study: AHI 66/h, SpO2 low 79%   08/01/2023:  OV with Roosevelt Bisher NP Drue Camera Hashemi is a 63 year old female with severe sleep apnea who presents for CPAP management and follow-up. She has been using a CPAP machine since her diagnosis of severe sleep apnea in 2018. She experiences difficulties with consistent use due to mouth dryness and mask discomfort. She has tried various masks and is currently using a full face mask. Despite these challenges, the CPAP improves her breathing at night and reduces nocturia. Her last sleep study was conducted in 2023, and she has not received a new CPAP machine since then. Her CPAP settings are titrated to a pressure range of 14 to 20 cm H2O. She is unsure if she snores while using the CPAP but believes it helps reduce snoring. Significant mouth dryness is attributed to sleeping with her mouth open, and she has considered using a mouthpiece but has not pursued it due to dental work requirements. She feels tired during the day but not excessively so, and does not experience daytime sleepiness to the extent of dozing off during conversations. No excessive daytime sleepiness, but significant mouth dryness, especially upon waking. She is aware that her weight may contribute to her sleep apnea and mentions being overweight as a factor. She thinks her DME company is [employer].  Goes to bed around 10 PM to midnight.  Usually falls asleep quickly.  Wakes 2-3 times a night.  Usually gets up around 5:30 AM.  Does not operate any  heavy machinery in her job animal nutritionist.  No significant weight change over the last 2 years.  Last sleep study was at [facility] in 2023. Lives by herself.  Never smoker.  Does not drink alcohol.  No excessive caffeine intake.  Works as a designer, industrial/product.  Family history of cancer. [place] 4   04/15/2024: Today - follow up Discussed the use of AI scribe software for clinical note transcription with the patient, who gave verbal consent to proceed.  History of Present Illness Kim Roberts is a 63 year old female with obstructive sleep apnea who presents with difficulties using her CPAP machine.  She experiences ongoing difficulties with her CPAP machine, describing it as 'annoying' and often waking up to find it not functioning properly with air not blowing enough. She is uncertain if the issue is due to her movement during sleep or a problem with the machine itself. Although the CPAP helps her breathe better when used, she does not use it consistently. She denies drowsy driving. Does have daytime fatigue.   She has experienced weight loss, approximately 40 pounds since her last visit, attributed to her participation in a wellness program and the use of Mounjaro . Her current weight loss goal is to reach 200 pounds, as discussed with her wellness provider. She is currently on Mounjaro  at 5 mg, which she has been on for a few months. She has experienced some side effects, including a cascade of symptoms such as gout, which she  is unsure if they are related to her medications. She's working with her wellness provider on this.   No CPAP download available     Allergies  Allergen Reactions   Latex Rash    Immunization History  Administered Date(s) Administered   DTaP 12/11/1965, 01/23/1966, 02/22/1966, 02/01/1969   IPV 12/11/1965, 01/22/1966, 02/01/1969   Influenza Inj Mdck Quad With Preservative 03/20/2019   Influenza,inj,Quad PF,6+ Mos 03/30/2016, 04/05/2018, 03/24/2020   Influenza-Unspecified  03/11/2014, 03/18/2017, 04/05/2018   MMR 12/20/1997   PFIZER(Purple Top)SARS-COV-2 Vaccination 01/14/2020, 02/10/2020   Tdap 01/11/2011, 01/16/2021    Past Medical History:  Diagnosis Date   Breast cancer (HCC) 2017   Left Breast Cancer   Colon polyp    Diabetes mellitus without complication (HCC)    Gout    Heart murmur    as a child   History of radiation therapy 04/19/16- 06/07/16   Left Breast/ 50.4 Gy in 28 fractions, Left Breast boosted/ 10 Gy in 5 fractions   Hyperlipidemia    Hypertension    Menopause    Personal history of radiation therapy 2017   Left Breast Cancer   Sleep apnea    uses CPAP nightly   Swelling of lower extremity    Tuberculosis    in childhood, treated.     Tobacco History: Social History   Tobacco Use  Smoking Status Never  Smokeless Tobacco Never   Counseling given: Not Answered   Outpatient Medications Prior to Visit  Medication Sig Dispense Refill   atorvastatin  (LIPITOR) 20 MG tablet Take 1 tablet (20 mg total) by mouth daily. 90 tablet 3   colchicine  0.6 MG tablet TAKE 1 TABLET BY MOUTH TWICE  DAILY FOR GOUT AS NEEDED. TAPER  TO 1 TABLET BY MOUTH DAILY AS  TOLERATED 60 tablet 2   dicyclomine (BENTYL) 10 MG capsule Take 1 capsule (10 mg total) by mouth 4 (four) times daily -  before meals and at bedtime. 40 capsule 1   lisinopril  (ZESTRIL ) 20 MG tablet Take 1 tablet (20 mg total) by mouth daily. 90 tablet 3   metFORMIN  (GLUCOPHAGE ) 500 MG tablet Take 1 tablet (500 mg total) by mouth 2 (two) times daily with a meal. 180 tablet 3   ondansetron  (ZOFRAN ) 4 MG tablet TAKE 1 TABLET BY MOUTH EVERY 8  HOURS AS NEEDED 30 tablet 0   potassium chloride  (KLOR-CON ) 10 MEQ tablet TAKE 1 TABLET BY MOUTH DAILY 90 tablet 0   tirzepatide  (MOUNJARO ) 5 MG/0.5ML Pen Inject 5 mg into the skin once a week. 4 mL 0   vitamin B-12 (CYANOCOBALAMIN) 1000 MCG tablet Take 1,000 mcg by mouth daily.     Vitamin D , Ergocalciferol , (DRISDOL ) 1.25 MG (50000 UNIT) CAPS  capsule Take 1 capsule (50,000 Units total) by mouth every 7 (seven) days. 12 capsule 0   No facility-administered medications prior to visit.     Review of Systems:   Constitutional: No weight loss or gain, night sweats, fevers, chills, or lassitude. +occasional fatigue  HEENT: No headaches, difficulty swallowing, tooth/dental problems, or sore throat. No sneezing, itching, ear ache, nasal congestion, or post nasal drip. +mouth dryness  CV:  No chest pain, orthopnea, PND Resp: +snoring; witnessed apneas; baseline shortness of breath with exertion. Nocough GI:  No heartburn, indigestion Neuro: No dizziness or lightheadedness.  Psych: No depression or anxiety. Mood stable. +sleep disturbance     Physical Exam:  BP 132/85   Pulse 89   Temp 98.5 F (36.9 C) (Oral)  Ht 5' 5 (1.651 m) Comment: per patient  Wt 248 lb 9.6 oz (112.8 kg)   SpO2 98%   BMI 41.37 kg/m   GEN: Pleasant, interactive, well-appearing; morbidly obese; in no acute distress HEENT:  Normocephalic and atraumatic. PERRLA. Sclera white. Nasal turbinates pink, moist and patent bilaterally. No rhinorrhea present. Oropharynx pink and moist, without exudate or edema. No lesions, ulcerations, or postnasal drip. Mallampati III NECK:  Supple w/ fair ROM. Thyroid  symmetrical with no goiter or nodules palpated. No lymphadenopathy.   CV: RRR, no m/r/g PULMONARY:  Unlabored, regular breathing. Clear bilaterally A&P w/o wheezes/rales/rhonchi. No accessory muscle use.  GI: BS present and normoactive. Soft, non-tender to palpation.  MSK: No erythema, warmth or tenderness.  Neuro: A/Ox3. No focal deficits noted.   Skin: Warm, no lesions or rashe Psych: Normal affect and behavior. Judgement and thought content appropriate.     Lab Results:  CBC    Component Value Date/Time   WBC 6.6 03/17/2024 0841   WBC 7.2 04/21/2020 1058   RBC 4.20 03/17/2024 0841   RBC 4.33 04/21/2020 1058   HGB 12.6 03/17/2024 0841   HGB 12.8  04/05/2017 0929   HCT 39.7 03/17/2024 0841   HCT 38.4 04/05/2017 0929   PLT 331 03/17/2024 0841   MCV 95 03/17/2024 0841   MCV 93.9 04/05/2017 0929   MCH 30.0 03/17/2024 0841   MCH 30.3 04/21/2020 1058   MCHC 31.7 03/17/2024 0841   MCHC 32.8 04/21/2020 1058   RDW 14.5 03/17/2024 0841   RDW 15.1 (H) 04/05/2017 0929   LYMPHSABS 3.1 03/17/2024 0841   LYMPHSABS 3.2 04/05/2017 0929   MONOABS 0.5 04/21/2020 1058   MONOABS 0.5 04/05/2017 0929   EOSABS 0.1 03/17/2024 0841   BASOSABS 0.0 03/17/2024 0841   BASOSABS 0.0 04/05/2017 0929    BMET    Component Value Date/Time   NA 143 04/08/2024 1032   NA 141 04/05/2017 0929   K 4.1 04/08/2024 1032   K 3.4 (L) 04/05/2017 0929   CL 104 04/08/2024 1032   CL 104 02/14/2013 1411   CO2 26 04/08/2024 1032   CO2 28 04/05/2017 0929   GLUCOSE 99 04/08/2024 1032   GLUCOSE 118 (H) 10/17/2022 1000   GLUCOSE 114 04/05/2017 0929   BUN 18 04/08/2024 1032   BUN 14.2 04/05/2017 0929   CREATININE 0.83 04/08/2024 1032   CREATININE 0.9 04/05/2017 0929   CALCIUM  9.9 04/08/2024 1032   CALCIUM  9.5 04/05/2017 0929   GFRNONAA >60 10/17/2022 1000   GFRNONAA >60 02/14/2013 1411   GFRAA 93 04/11/2020 1031   GFRAA >60 02/14/2013 1411    BNP No results found for: BNP   Imaging:  No results found.  Administration History     None           No data to display          No results found for: NITRICOXIDE      Assessment & Plan:   Obstructive sleep apnea Severe OSA on CPAP. Difficulties tolerating. Unable to view download today. Machine is old. Will have her repeat HST and if appropriate, send orders for replacement CPAP therapy. She does feel sleep is more restful with CPAP than without. Reviewed risks of untreated OSA. Not a good candidate for oral appliance given severity and BMI is >40 so would not be a candidate for Inspire at this time. Healthy weight loss encouraged. Aware of safe driving practices.   Patient Instructions   Given your symptoms and history,  I am concerned that you still have sleep disordered breathing with sleep apnea. You will need a sleep study for further evaluation. Someone will contact you to schedule this. Once we get the results from this and if you still have sleep apnea, we can send for a new CPAP machine  We discussed how untreated sleep apnea puts an individual at risk for cardiac arrhthymias, pulm HTN, DM, stroke and increases their risk for daytime accidents. We also briefly reviewed treatment options including weight loss, side sleeping position, oral appliance, CPAP therapy or referral to ENT for possible surgical options  Use caution when driving and pull over if you become sleepy.  Follow up in 10-12 weeks with Katie Wende Longstreth,NP to  see how new CPAP is working, or sooner, if needed. Friday PM virtual clinic preferred       Obesity BMI 41. Encouraged continued healthy weight loss measures. Follow up with medical weight management as scheduled    Advised if symptoms do not improve or worsen, to please contact office for sooner follow up or seek emergency care.   I spent 35 minutes of dedicated to the care of this patient on the date of this encounter to include pre-visit review of records, face-to-face time with the patient discussing conditions above, post visit ordering of testing, clinical documentation with the electronic health record, making appropriate referrals as documented, and communicating necessary findings to members of the patients care team.  Comer LULLA Rouleau, NP 04/15/2024  Pt aware and understands NP's role.

## 2024-04-15 NOTE — Assessment & Plan Note (Signed)
 BMI 41. Encouraged continued healthy weight loss measures. Follow up with medical weight management as scheduled

## 2024-04-15 NOTE — Patient Instructions (Signed)
 Given your symptoms and history, I am concerned that you still have sleep disordered breathing with sleep apnea. You will need a sleep study for further evaluation. Someone will contact you to schedule this. Once we get the results from this and if you still have sleep apnea, we can send for a new CPAP machine  We discussed how untreated sleep apnea puts an individual at risk for cardiac arrhthymias, pulm HTN, DM, stroke and increases their risk for daytime accidents. We also briefly reviewed treatment options including weight loss, side sleeping position, oral appliance, CPAP therapy or referral to ENT for possible surgical options  Use caution when driving and pull over if you become sleepy.  Follow up in 10-12 weeks with Kim Alie Moudy,NP to  see how new CPAP is working, or sooner, if needed. Friday PM virtual clinic preferred

## 2024-04-15 NOTE — Assessment & Plan Note (Signed)
 Severe OSA on CPAP. Difficulties tolerating. Unable to view download today. Machine is old. Will have her repeat HST and if appropriate, send orders for replacement CPAP therapy. She does feel sleep is more restful with CPAP than without. Reviewed risks of untreated OSA. Not a good candidate for oral appliance given severity and BMI is >40 so would not be a candidate for Inspire at this time. Healthy weight loss encouraged. Aware of safe driving practices.   Patient Instructions  Given your symptoms and history, I am concerned that you still have sleep disordered breathing with sleep apnea. You will need a sleep study for further evaluation. Someone will contact you to schedule this. Once we get the results from this and if you still have sleep apnea, we can send for a new CPAP machine  We discussed how untreated sleep apnea puts an individual at risk for cardiac arrhthymias, pulm HTN, DM, stroke and increases their risk for daytime accidents. We also briefly reviewed treatment options including weight loss, side sleeping position, oral appliance, CPAP therapy or referral to ENT for possible surgical options  Use caution when driving and pull over if you become sleepy.  Follow up in 10-12 weeks with Katie Shekia Kuper,NP to  see how new CPAP is working, or sooner, if needed. Friday PM virtual clinic preferred

## 2024-04-18 ENCOUNTER — Other Ambulatory Visit: Payer: Self-pay | Admitting: Bariatrics

## 2024-04-18 DIAGNOSIS — E559 Vitamin D deficiency, unspecified: Secondary | ICD-10-CM

## 2024-04-18 DIAGNOSIS — Z6841 Body Mass Index (BMI) 40.0 and over, adult: Secondary | ICD-10-CM

## 2024-05-19 ENCOUNTER — Other Ambulatory Visit: Payer: Self-pay | Admitting: Family Medicine

## 2024-05-22 NOTE — Telephone Encounter (Signed)
 Sent. Thanks.

## 2024-05-24 ENCOUNTER — Other Ambulatory Visit: Payer: Self-pay | Admitting: Bariatrics

## 2024-05-24 DIAGNOSIS — E119 Type 2 diabetes mellitus without complications: Secondary | ICD-10-CM

## 2024-05-24 DIAGNOSIS — E66813 Obesity, class 3: Secondary | ICD-10-CM

## 2024-05-24 DIAGNOSIS — Z0000 Encounter for general adult medical examination without abnormal findings: Secondary | ICD-10-CM

## 2024-05-26 ENCOUNTER — Encounter: Payer: Self-pay | Admitting: *Deleted

## 2024-05-29 ENCOUNTER — Encounter: Payer: Self-pay | Admitting: Bariatrics

## 2024-05-31 ENCOUNTER — Encounter

## 2024-05-31 DIAGNOSIS — G4733 Obstructive sleep apnea (adult) (pediatric): Secondary | ICD-10-CM

## 2024-05-31 DIAGNOSIS — Z6841 Body Mass Index (BMI) 40.0 and over, adult: Secondary | ICD-10-CM

## 2024-06-01 ENCOUNTER — Other Ambulatory Visit: Payer: Self-pay

## 2024-06-01 DIAGNOSIS — E559 Vitamin D deficiency, unspecified: Secondary | ICD-10-CM

## 2024-06-01 DIAGNOSIS — Z6841 Body Mass Index (BMI) 40.0 and over, adult: Secondary | ICD-10-CM

## 2024-06-01 MED ORDER — VITAMIN D (ERGOCALCIFEROL) 1.25 MG (50000 UNIT) PO CAPS: 50000.0000 [IU] | ORAL_CAPSULE | ORAL | 0 refills | Status: DC

## 2024-06-08 ENCOUNTER — Other Ambulatory Visit (HOSPITAL_COMMUNITY): Payer: Self-pay | Admitting: General Surgery

## 2024-06-08 DIAGNOSIS — E213 Hyperparathyroidism, unspecified: Secondary | ICD-10-CM

## 2024-06-09 ENCOUNTER — Other Ambulatory Visit: Payer: Self-pay

## 2024-06-09 ENCOUNTER — Telehealth: Payer: Self-pay | Admitting: Bariatrics

## 2024-06-09 DIAGNOSIS — Z0000 Encounter for general adult medical examination without abnormal findings: Secondary | ICD-10-CM

## 2024-06-09 DIAGNOSIS — Z6841 Body Mass Index (BMI) 40.0 and over, adult: Secondary | ICD-10-CM

## 2024-06-09 DIAGNOSIS — E119 Type 2 diabetes mellitus without complications: Secondary | ICD-10-CM

## 2024-06-09 MED ORDER — MOUNJARO 5 MG/0.5ML ~~LOC~~ SOAJ: 5.0000 mg | SUBCUTANEOUS | 0 refills | Status: DC

## 2024-06-09 NOTE — Telephone Encounter (Signed)
 Patient stated she needs a refill of Mounjaro  sent in today. She stated she only need 3 injections to get her to her next appointment. Her next appointment is 06/30/2024. She canceled her last appointment on 06/17/2024. Pharmacy she would like to use it the [facility]. If you have any questions, please give her a call at [phone]. Thank you!

## 2024-06-09 NOTE — Telephone Encounter (Signed)
 Spoke to patient and notified her per Dr. Delores that she can have one refill until her follow-up appointment and will discuss medication changes at that time. Patient verbalized understanding

## 2024-06-12 ENCOUNTER — Encounter (HOSPITAL_COMMUNITY)

## 2024-06-12 ENCOUNTER — Encounter (HOSPITAL_COMMUNITY): Payer: Self-pay

## 2024-06-12 ENCOUNTER — Ambulatory Visit (HOSPITAL_COMMUNITY)

## 2024-06-16 DIAGNOSIS — G4730 Sleep apnea, unspecified: Secondary | ICD-10-CM | POA: Diagnosis not present

## 2024-06-17 ENCOUNTER — Ambulatory Visit: Admitting: Bariatrics

## 2024-06-18 ENCOUNTER — Ambulatory Visit: Payer: Self-pay | Admitting: Nurse Practitioner

## 2024-06-18 DIAGNOSIS — G4733 Obstructive sleep apnea (adult) (pediatric): Secondary | ICD-10-CM

## 2024-06-18 NOTE — Progress Notes (Signed)
 Severe OSA. Needs to resume CPAP but also needs a new machine. She's had difficulties tolerating CPAP in the past so recommend CPAP titration as well. Please place urgent order for new start CPAP 5-15 cmH2O, given severity, and CPAP titration (does not need to be urgent). F/u in 12 weeks to review usage/study. Thanks.

## 2024-06-25 ENCOUNTER — Telehealth: Payer: Self-pay

## 2024-06-25 NOTE — Telephone Encounter (Signed)
 Received fax from [employer] stating that pt is not eligible for a new machine until 07/2027. Pt has upcoming appt on 08/19/24 with Dr. Olena for [facility] and for a CPAP Titration on 08/19/24.  Pt states she will be unable to make appt with Dr. Olena. I recommended to reschedule her appt, however she states she will have to call back to reschedule once she has her schedule. Confirmed with pt that she will be attending her CPAP titration that evening. Pt verbalized understanding, NFN.

## 2024-06-30 ENCOUNTER — Encounter: Payer: Self-pay | Admitting: Bariatrics

## 2024-06-30 ENCOUNTER — Ambulatory Visit: Payer: Self-pay | Admitting: Bariatrics

## 2024-06-30 VITALS — BP 142/84 | HR 86 | Ht 65.0 in | Wt 246.0 lb

## 2024-06-30 DIAGNOSIS — K5900 Constipation, unspecified: Secondary | ICD-10-CM | POA: Diagnosis not present

## 2024-06-30 DIAGNOSIS — E559 Vitamin D deficiency, unspecified: Secondary | ICD-10-CM

## 2024-06-30 DIAGNOSIS — Z6841 Body Mass Index (BMI) 40.0 and over, adult: Secondary | ICD-10-CM | POA: Diagnosis not present

## 2024-06-30 DIAGNOSIS — E119 Type 2 diabetes mellitus without complications: Secondary | ICD-10-CM | POA: Diagnosis not present

## 2024-06-30 DIAGNOSIS — Z7985 Long-term (current) use of injectable non-insulin antidiabetic drugs: Secondary | ICD-10-CM

## 2024-06-30 DIAGNOSIS — K5909 Other constipation: Secondary | ICD-10-CM

## 2024-06-30 DIAGNOSIS — Z7984 Long term (current) use of oral hypoglycemic drugs: Secondary | ICD-10-CM | POA: Diagnosis not present

## 2024-06-30 MED ORDER — TIRZEPATIDE 7.5 MG/0.5ML ~~LOC~~ SOAJ: 7.5000 mg | SUBCUTANEOUS | 0 refills | Status: AC

## 2024-06-30 MED ORDER — VITAMIN D (ERGOCALCIFEROL) 1.25 MG (50000 UNIT) PO CAPS: 50000.0000 [IU] | ORAL_CAPSULE | ORAL | 0 refills | Status: AC

## 2024-06-30 MED ORDER — POLYETHYLENE GLYCOL 3350 17 GM/SCOOP PO POWD: 17.0000 g | Freq: Every day | ORAL | 0 refills | Status: AC

## 2024-06-30 NOTE — Progress Notes (Signed)
 "                                                                                                             WEIGHT SUMMARY AND BIOMETRICS  Weight Lost Since Last Visit: 0  Weight Gained Since Last Visit: 3lb   Vitals BP: (!) 142/84 (Cant take BP on left arm) Pulse Rate: 86 SpO2: 99 %   Anthropometric Measurements Height: 5' 5 (1.651 m) Weight: 246 lb (111.6 kg) BMI (Calculated): 40.94 Weight at Last Visit: 243lb Weight Lost Since Last Visit: 0 Weight Gained Since Last Visit: 3lb Starting Weight: 279lb Total Weight Loss (lbs): 33 lb (15 kg)   Body Composition  Body Fat %: 47.7 % Fat Mass (lbs): 117.8 lbs Muscle Mass (lbs): 122.4 lbs Total Body Water (lbs): 94.6 lbs Visceral Fat Rating : 16   Other Clinical Data Fasting: no Labs: no Today's Visit #: 8 Starting Date: 10/31/23    OBESITY Samiksha is here to discuss her progress with her obesity treatment plan along with follow-up of her obesity related diagnoses.    Nutrition Plan: the Category 3 plan - 40% adherence.  Current exercise: yard work  Interim History:  She is up 3 pounds since her last visit according to the above impedance scale her body fat is down 0.2% and her muscle mass is up to 2.2 pounds. She states that she wants to get back on track. Eating all of the food on the plan., Protein intake is as prescribed, and Water intake is adequate.   Pharmacotherapy: Lissandra is on Mounjaro  5.0 mg SQ weekly Adverse side effects: Constipation Hunger is moderately controlled.  Cravings are moderately controlled.  Assessment/Plan:   Vitamin D  Insuffiency:  Vitamin D  is at goal of 50.  Most recent vitamin D  level was 64.2. She is on  prescription ergocalciferol  50,000 IU weekly. Lab Results  Component Value Date   VD25OH 64.2 03/17/2024   VD25OH 24.1 (L) 10/31/2023    Plan: Refill prescription vitamin D  50,000 IU weekly.    Type II Diabetes HgbA1c is not at goal. Last A1c was 6.8 Episodes  of hypoglycemia: no Medication(s): Mounjaro  5.0 mg SQ weekly and Metformin  500 mg twice daily with meals  Lab Results  Component Value Date   HGBA1C 6.8 (H) 03/17/2024   HGBA1C 7.8 (H) 10/31/2023   HGBA1C 7.2 (H) 03/06/2023   Lab Results  Component Value Date   LDLCALC 68 04/08/2024   CREATININE 0.83 04/08/2024   Lab Results  Component Value Date   GFR 97.43 04/22/2017   GFR 76.33 01/30/2016   GFR 96.90 12/15/2014    Plan: Continue and increase dose Mounjaro  7.5 mg SQ weekly Continue all other medications.  Will keep all carbohydrates low both sweets and starches.  Will continue exercise regimen to 30 to 60 minutes on most days of the week.  Aim for 7 to 9 hours of sleep nightly.  Eat more low glycemic index foods.   Constipation Clay notes constipation.   This is likely related to GLP-1.  Constipation  is moderately controlled.   Plan: Increase fiber up to 25 to 30 grams of fiber.   Increase water intake to at least 64 ounces daily.  Add MiraLAX  once daily.  May take twice a day or every other day based on results. Rx: Mirelax (17 g) with a full glass of water daily in the am.  Add Metamucil or Citrucel daily if needed    Morbid Obesity: Current BMI BMI (Calculated): 40.94   Pharmacotherapy Plan Continue and increase dose  Mounjaro  7.5 mg SQ weekly  Maxi is currently in the action stage of change. As such, her goal is to continue with weight loss efforts.  She has agreed to the Category 3 plan.  Exercise goals: All adults should avoid inactivity. Some physical activity is better than none, and adults who participate in any amount of physical activity gain some health benefits.  Behavioral modification strategies: increasing lean protein intake, decreasing simple carbohydrates , no meal skipping, and meal planning .  Mry has agreed to follow-up with our clinic in 4 weeks.   Objective:   VITALS: Per patient if applicable, see vitals. GENERAL: Alert and  in no acute distress. CARDIOPULMONARY: No increased WOB. Speaking in clear sentences.  PSYCH: Pleasant and cooperative. Speech normal rate and rhythm. Affect is appropriate. Insight and judgement are appropriate. Attention is focused, linear, and appropriate.  NEURO: Oriented as arrived to appointment on time with no prompting.   Attestation Statements:   This was prepared with the assistance of Engineer, Civil (consulting).  Occasional wrong-word or sound-a-like substitutions may have occurred due to the inherent limitations of voice recognition.   Clayborne Daring, DO    "

## 2024-07-16 ENCOUNTER — Other Ambulatory Visit: Payer: Self-pay

## 2024-07-16 DIAGNOSIS — G4733 Obstructive sleep apnea (adult) (pediatric): Secondary | ICD-10-CM

## 2024-08-19 ENCOUNTER — Encounter: Admitting: Pulmonary Disease

## 2024-08-19 ENCOUNTER — Ambulatory Visit (HOSPITAL_BASED_OUTPATIENT_CLINIC_OR_DEPARTMENT_OTHER): Admitting: Pulmonary Disease

## 2024-09-30 ENCOUNTER — Ambulatory Visit: Payer: Self-pay | Admitting: Bariatrics
